# Patient Record
Sex: Female | Born: 1970 | Hispanic: No | Marital: Married | Smoking: Never smoker
Health system: Southern US, Community
[De-identification: ages and names within clinical notes are randomized; demographics above are authoritative.]

## PROBLEM LIST (undated history)

## (undated) DIAGNOSIS — L409 Psoriasis, unspecified: Secondary | ICD-10-CM

## (undated) DIAGNOSIS — N926 Irregular menstruation, unspecified: Secondary | ICD-10-CM

## (undated) DIAGNOSIS — I10 Essential (primary) hypertension: Secondary | ICD-10-CM

## (undated) DIAGNOSIS — G43909 Migraine, unspecified, not intractable, without status migrainosus: Secondary | ICD-10-CM

## (undated) DIAGNOSIS — N760 Acute vaginitis: Secondary | ICD-10-CM

## (undated) DIAGNOSIS — Z22330 Carrier of Group B streptococcus: Secondary | ICD-10-CM

## (undated) DIAGNOSIS — N83201 Unspecified ovarian cyst, right side: Secondary | ICD-10-CM

## (undated) DIAGNOSIS — N979 Female infertility, unspecified: Secondary | ICD-10-CM

## (undated) DIAGNOSIS — N898 Other specified noninflammatory disorders of vagina: Secondary | ICD-10-CM

## (undated) DIAGNOSIS — M1990 Unspecified osteoarthritis, unspecified site: Secondary | ICD-10-CM

## (undated) DIAGNOSIS — Z8742 Personal history of other diseases of the female genital tract: Secondary | ICD-10-CM

## (undated) DIAGNOSIS — Z8619 Personal history of other infectious and parasitic diseases: Secondary | ICD-10-CM

## (undated) DIAGNOSIS — Z8042 Family history of malignant neoplasm of prostate: Secondary | ICD-10-CM

## (undated) DIAGNOSIS — Z658 Other specified problems related to psychosocial circumstances: Secondary | ICD-10-CM

## (undated) DIAGNOSIS — O139 Gestational [pregnancy-induced] hypertension without significant proteinuria, unspecified trimester: Secondary | ICD-10-CM

## (undated) DIAGNOSIS — B9689 Other specified bacterial agents as the cause of diseases classified elsewhere: Secondary | ICD-10-CM

## (undated) HISTORY — DX: Other specified problems related to psychosocial circumstances: Z65.8

## (undated) HISTORY — DX: Irregular menstruation, unspecified: N92.6

## (undated) HISTORY — DX: Other specified noninflammatory disorders of vagina: N89.8

## (undated) HISTORY — DX: Family history of malignant neoplasm of prostate: Z80.42

## (undated) HISTORY — DX: Personal history of other diseases of the female genital tract: Z87.42

## (undated) HISTORY — PX: BREAST ENHANCEMENT SURGERY: SHX7

## (undated) HISTORY — DX: Female infertility, unspecified: N97.9

## (undated) HISTORY — DX: Essential (primary) hypertension: I10

## (undated) HISTORY — DX: Personal history of other infectious and parasitic diseases: Z86.19

## (undated) HISTORY — PX: BREAST SURGERY: SHX581

## (undated) HISTORY — DX: Acute vaginitis: N76.0

## (undated) HISTORY — DX: Other specified bacterial agents as the cause of diseases classified elsewhere: B96.89

## (undated) HISTORY — DX: Carrier of group B Streptococcus: Z22.330

## (undated) HISTORY — DX: Unspecified ovarian cyst, right side: N83.201

## (undated) HISTORY — DX: Gestational (pregnancy-induced) hypertension without significant proteinuria, unspecified trimester: O13.9

---

## 2001-06-28 DIAGNOSIS — N83201 Unspecified ovarian cyst, right side: Secondary | ICD-10-CM

## 2001-06-28 HISTORY — DX: Unspecified ovarian cyst, right side: N83.201

## 2001-11-26 DIAGNOSIS — N926 Irregular menstruation, unspecified: Secondary | ICD-10-CM

## 2001-11-26 HISTORY — DX: Irregular menstruation, unspecified: N92.6

## 2001-12-06 ENCOUNTER — Other Ambulatory Visit: Admission: RE | Admit: 2001-12-06 | Discharge: 2001-12-06 | Payer: Self-pay | Admitting: Obstetrics and Gynecology

## 2002-11-22 ENCOUNTER — Other Ambulatory Visit: Admission: RE | Admit: 2002-11-22 | Discharge: 2002-11-22 | Payer: Self-pay | Admitting: Obstetrics and Gynecology

## 2003-09-07 ENCOUNTER — Inpatient Hospital Stay (HOSPITAL_COMMUNITY): Admission: RE | Admit: 2003-09-07 | Discharge: 2003-09-07 | Payer: Self-pay | Admitting: Obstetrics and Gynecology

## 2003-10-10 ENCOUNTER — Other Ambulatory Visit: Admission: RE | Admit: 2003-10-10 | Discharge: 2003-10-10 | Payer: Self-pay | Admitting: Gynecology

## 2004-05-26 ENCOUNTER — Ambulatory Visit (HOSPITAL_COMMUNITY): Admission: RE | Admit: 2004-05-26 | Discharge: 2004-05-26 | Payer: Self-pay | Admitting: Obstetrics and Gynecology

## 2004-05-29 ENCOUNTER — Ambulatory Visit: Payer: Self-pay | Admitting: Internal Medicine

## 2004-06-25 ENCOUNTER — Ambulatory Visit (HOSPITAL_COMMUNITY): Admission: RE | Admit: 2004-06-25 | Discharge: 2004-06-25 | Payer: Self-pay | Admitting: Obstetrics and Gynecology

## 2004-07-22 ENCOUNTER — Ambulatory Visit (HOSPITAL_COMMUNITY): Admission: RE | Admit: 2004-07-22 | Discharge: 2004-07-22 | Payer: Self-pay | Admitting: Obstetrics and Gynecology

## 2004-07-30 ENCOUNTER — Ambulatory Visit (HOSPITAL_COMMUNITY): Admission: RE | Admit: 2004-07-30 | Discharge: 2004-07-30 | Payer: Self-pay | Admitting: Obstetrics and Gynecology

## 2004-08-19 ENCOUNTER — Ambulatory Visit (HOSPITAL_COMMUNITY): Admission: RE | Admit: 2004-08-19 | Discharge: 2004-08-19 | Payer: Self-pay | Admitting: Obstetrics and Gynecology

## 2004-08-31 ENCOUNTER — Inpatient Hospital Stay (HOSPITAL_COMMUNITY): Admission: AD | Admit: 2004-08-31 | Discharge: 2004-08-31 | Payer: Self-pay | Admitting: Obstetrics and Gynecology

## 2004-09-03 ENCOUNTER — Inpatient Hospital Stay (HOSPITAL_COMMUNITY): Admission: AD | Admit: 2004-09-03 | Discharge: 2004-09-07 | Payer: Self-pay | Admitting: Obstetrics and Gynecology

## 2004-09-15 ENCOUNTER — Inpatient Hospital Stay (HOSPITAL_COMMUNITY): Admission: AD | Admit: 2004-09-15 | Discharge: 2004-09-17 | Payer: Self-pay | Admitting: Obstetrics and Gynecology

## 2004-09-20 ENCOUNTER — Inpatient Hospital Stay (HOSPITAL_COMMUNITY): Admission: AD | Admit: 2004-09-20 | Discharge: 2004-09-20 | Payer: Self-pay | Admitting: Obstetrics and Gynecology

## 2004-10-13 ENCOUNTER — Other Ambulatory Visit: Admission: RE | Admit: 2004-10-13 | Discharge: 2004-10-13 | Payer: Self-pay | Admitting: Obstetrics and Gynecology

## 2005-06-28 DIAGNOSIS — Z8742 Personal history of other diseases of the female genital tract: Secondary | ICD-10-CM

## 2005-06-28 HISTORY — DX: Personal history of other diseases of the female genital tract: Z87.42

## 2005-07-27 ENCOUNTER — Ambulatory Visit: Payer: Self-pay | Admitting: Internal Medicine

## 2005-11-10 ENCOUNTER — Other Ambulatory Visit: Admission: RE | Admit: 2005-11-10 | Discharge: 2005-11-10 | Payer: Self-pay | Admitting: Obstetrics and Gynecology

## 2005-12-28 ENCOUNTER — Ambulatory Visit: Payer: Self-pay | Admitting: Internal Medicine

## 2006-08-01 ENCOUNTER — Inpatient Hospital Stay (HOSPITAL_COMMUNITY): Admission: AD | Admit: 2006-08-01 | Discharge: 2006-08-01 | Payer: Self-pay | Admitting: Obstetrics and Gynecology

## 2006-08-04 IMAGING — US US OB DETAIL+14 WK
1 series · 13 of 28 positions shown · non-contrast
Comparison: none

CLINICAL DATA: 26 week 3 day gestational age by LMP.  Chronic hypertension and previous miscarriages.

[Series 1: us ob detail+14 wk · 0.35mm/px · 13 of 130 slices shown]
[im 5/130]
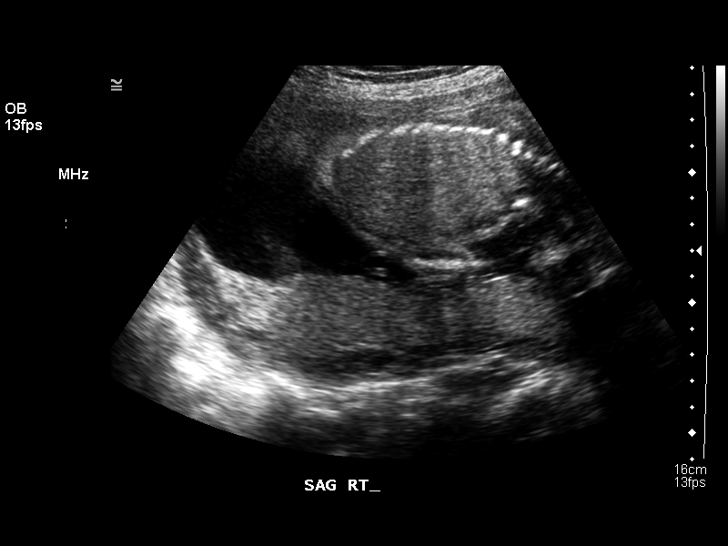
[im 15/130]
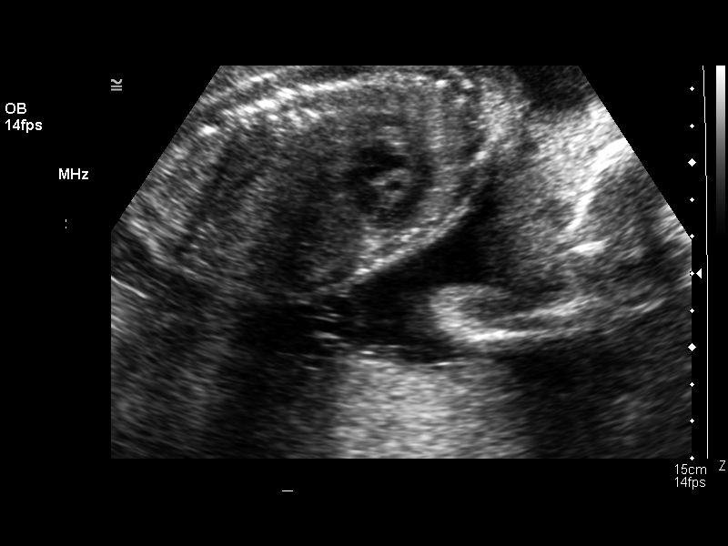
[im 24/130]
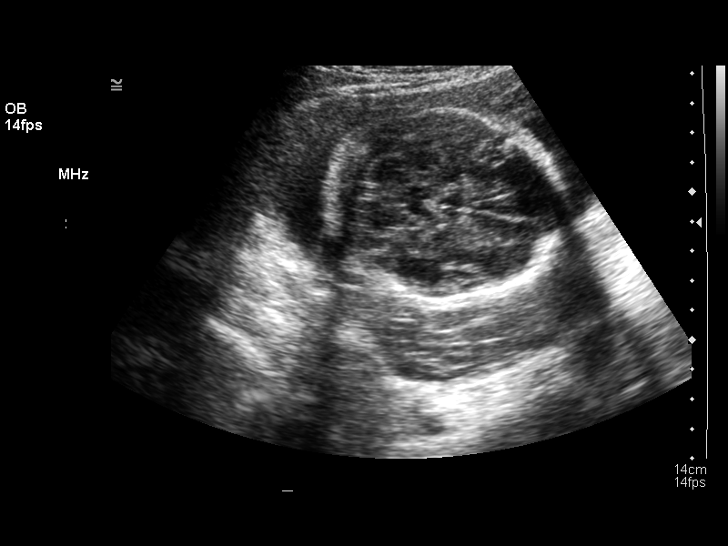
[im 34/130]
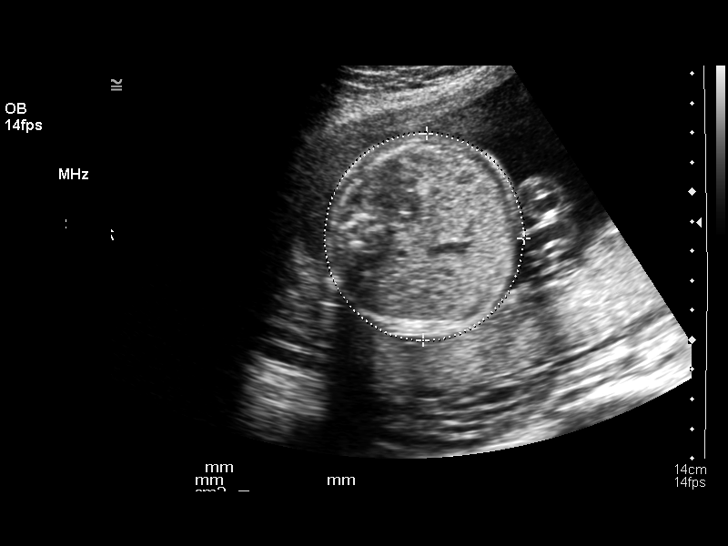
[im 44/130]
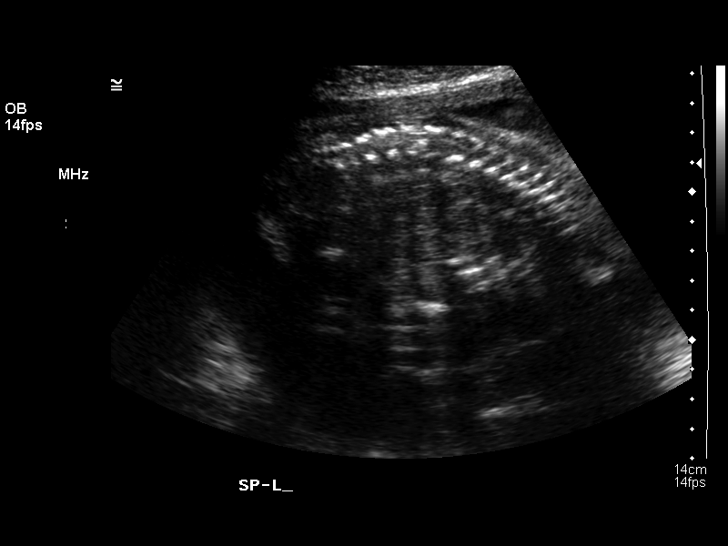
[im 53/130]
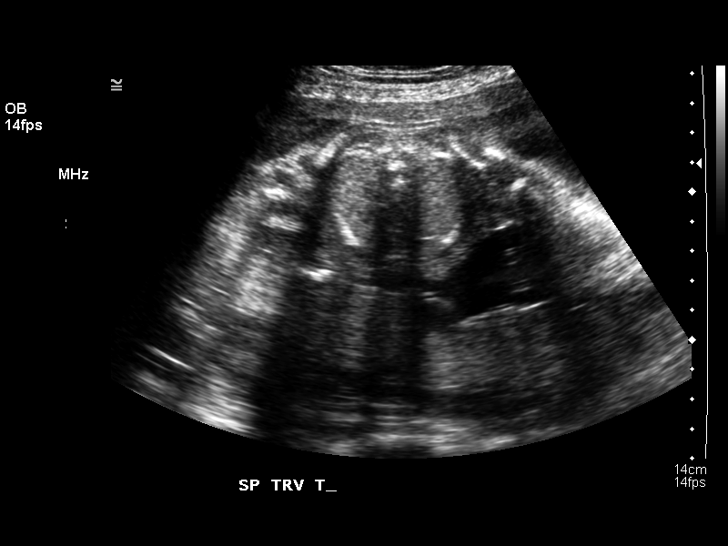
[im 67/130]
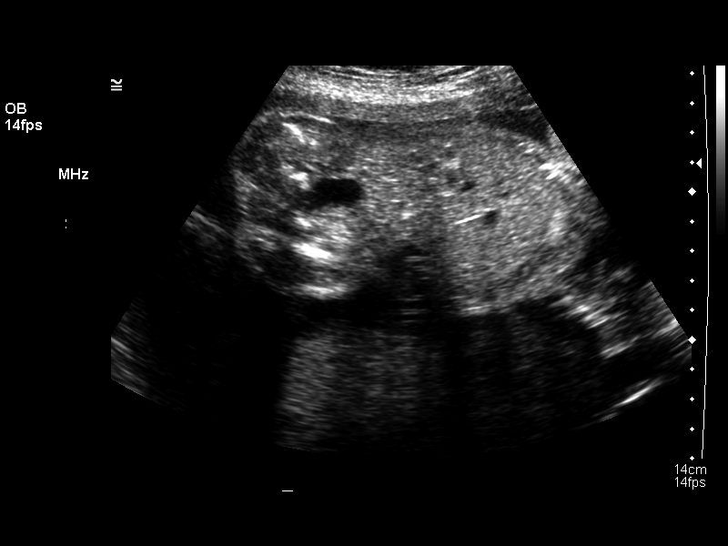
[im 77/130]
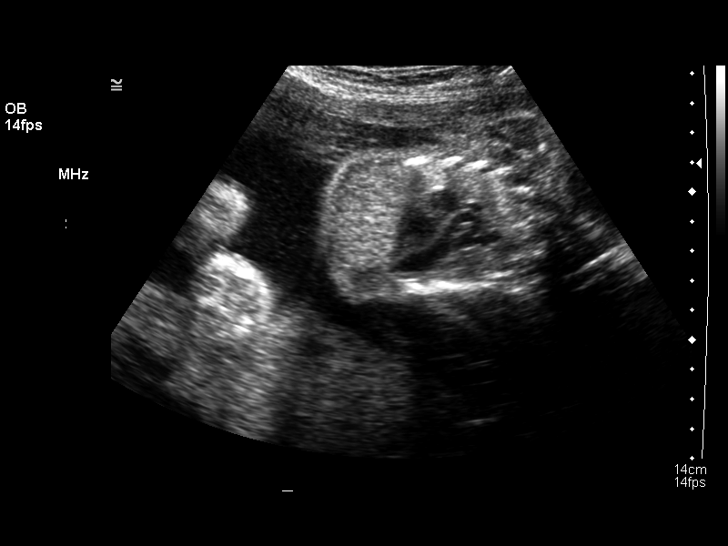
[im 87/130]
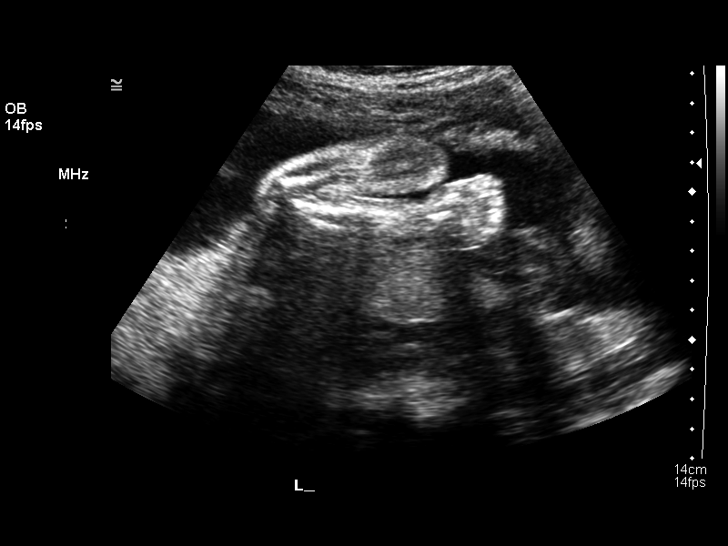
[im 96/130]
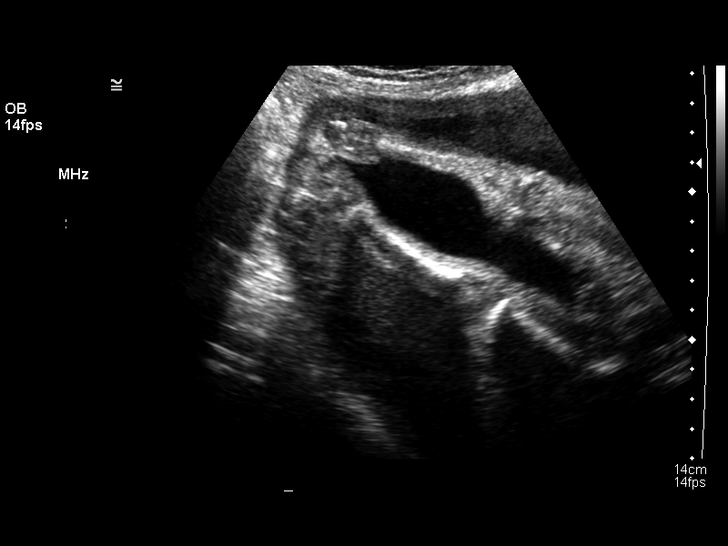
[im 106/130]
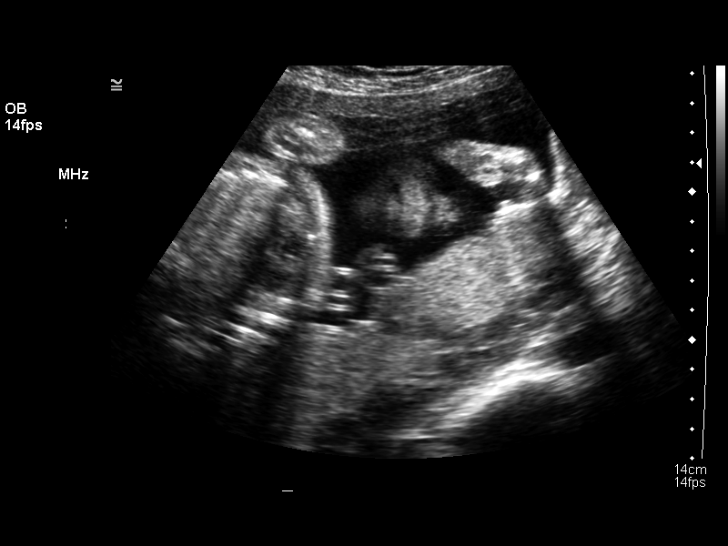
[im 115/130]
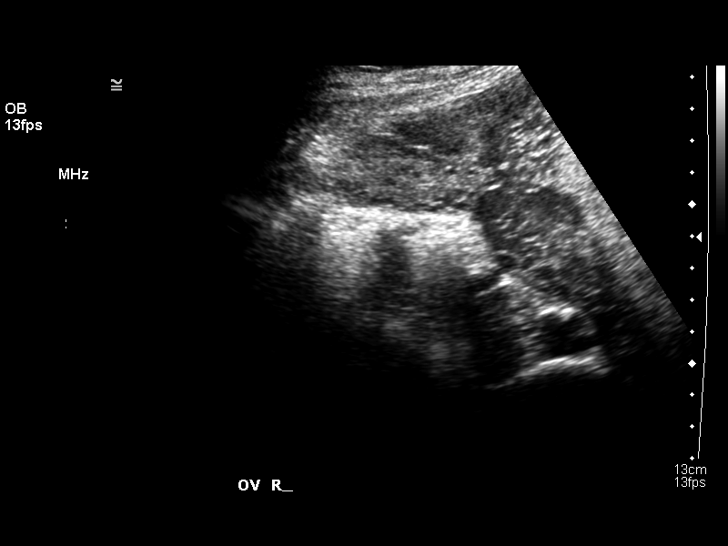
[im 125/130]
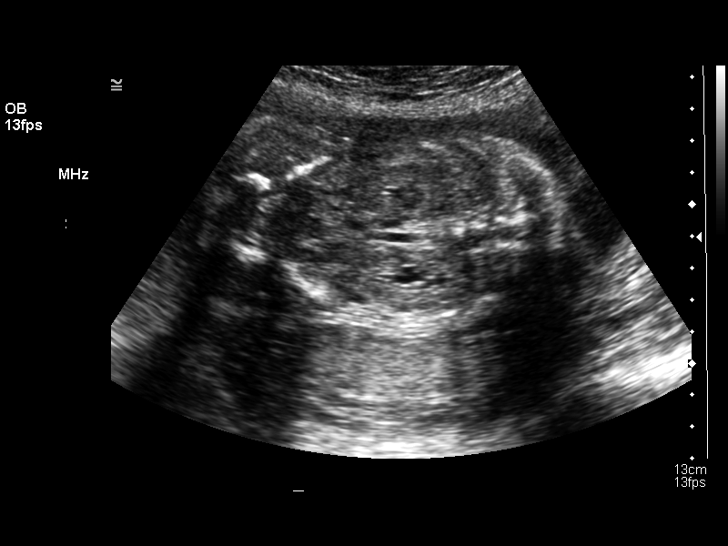

[13 of 28 positions shown; findings below may reference images not displayed]

DETAILED OBSTETRICAL ULTRASOUND:

 Number of Fetuses:  1
 Heart Rate:  153
 Movement:  Yes
 Breathing:  No
 Presentation:  Cephalic
 Placental Location:  Posterior
 Grade:  I
 Previa: No
 Amniotic Fluid (Subjective):  Normal
 Amniotic Fluid (Objective):  5.6 cm Vertical pocket 

 FETAL BIOMETRY
 BPD:  6.5 cm   26 w 3 d
 HC:  24.2 cm   26 w 2 d
 AC:  21.5 cm   26 w 1 d
 FL:  4.8 cm   26 w 3 d
 HL:  4.4 cm   26 w 2 d

 MEAN GA:  26 w 2 d

 FETAL ANATOMY
 Lateral Ventricles:    Visualized 
 Thalami/CSP:  Visualized 
 Posterior Fossa:  Visualized 
 Nuchal Region:  N/A
 Spine:  Visualized   
 4 Chamber Heart on Left:  Visualized 
 Stomach on Left:  Visualized 
 3 Vessel Cord:  Visualized 
 Cord Insertion Site:  Visualized 
 Kidneys:  Visualized 
 Bladder:  Visualized 
 Extremities:  Visualized 

 ADDITIONAL ANATOMY VISUALIZED:  LVOT, RVOT, upper lip, orbits, profile, diaphragm, heel,  5th digit, ductal arch, aortic arch.
 Comment:  A nasal bone is seen.  No sonographic markers for aneuploidy are identified.

 MATERNAL UTERINE AND ADNEXAL FINDINGS
 Cervix:  3.7 cm Transabdominally.
 The right ovary is visualized and is normal in appearance.  The left ovary is not visualized, but no adnexal masses are identified.
IMPRESSION: Single living intrauterine fetus with mean gestational age of 26 weeks 2 days and sonographic EDC of 08/30/04.  This correlates closely with LMP dating. 
 No evidence of fetal anatomic abnormality.  
 Normal amniotic fluid volume.  

 </u12:p>

## 2006-08-15 ENCOUNTER — Inpatient Hospital Stay (HOSPITAL_COMMUNITY): Admission: AD | Admit: 2006-08-15 | Discharge: 2006-08-15 | Payer: Self-pay | Admitting: Obstetrics and Gynecology

## 2006-09-01 ENCOUNTER — Inpatient Hospital Stay (HOSPITAL_COMMUNITY): Admission: AD | Admit: 2006-09-01 | Discharge: 2006-09-04 | Payer: Self-pay | Admitting: Obstetrics and Gynecology

## 2006-09-03 IMAGING — US US OB FOLLOW-UP
1 series · 13 of 28 positions shown · non-contrast
Comparison: none

CLINICAL DATA: Chronic hypertension.  Assess growth.

[Series 1: us ob follow-up · 0.33mm/px · 13 of 54 slices shown]
[im 2/54]
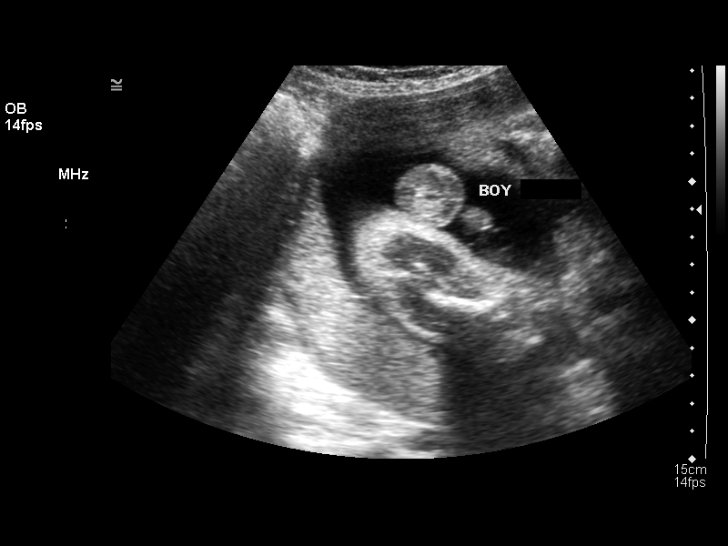
[im 6/54]
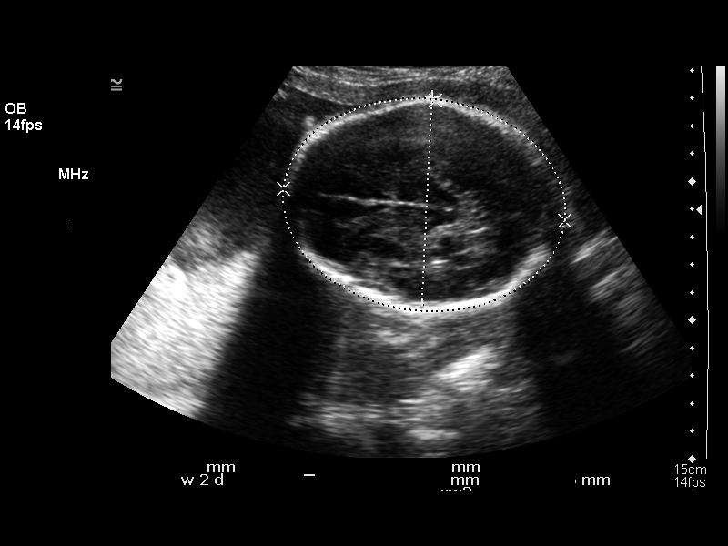
[im 10/54]
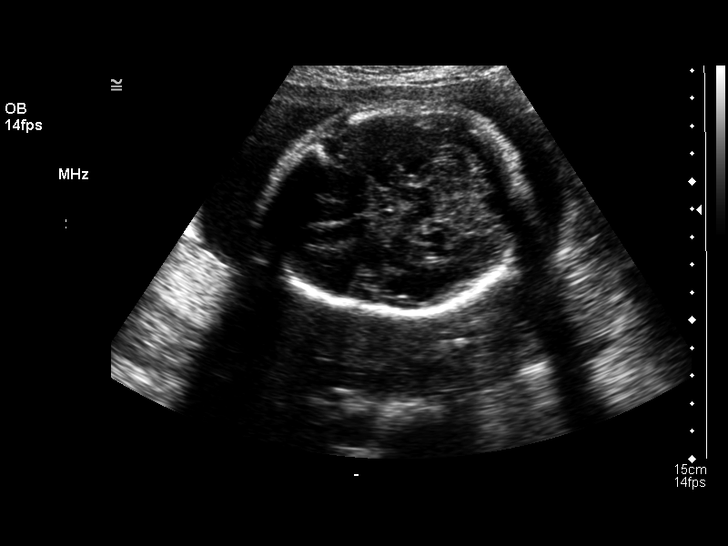
[im 14/54]
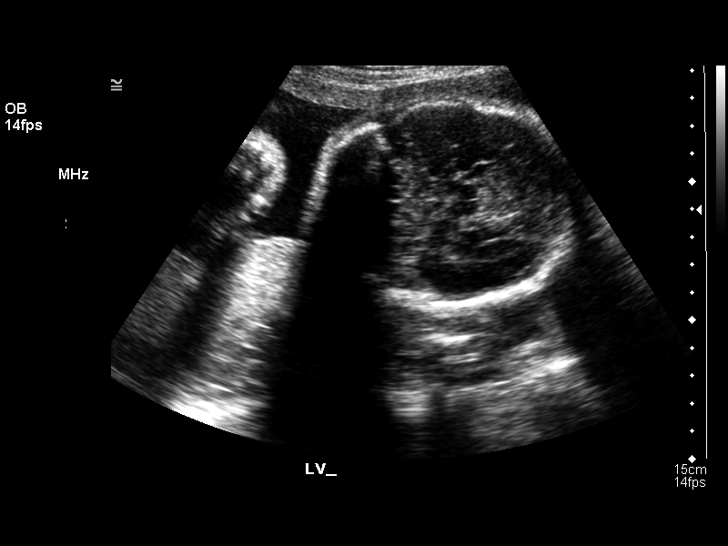
[im 18/54]
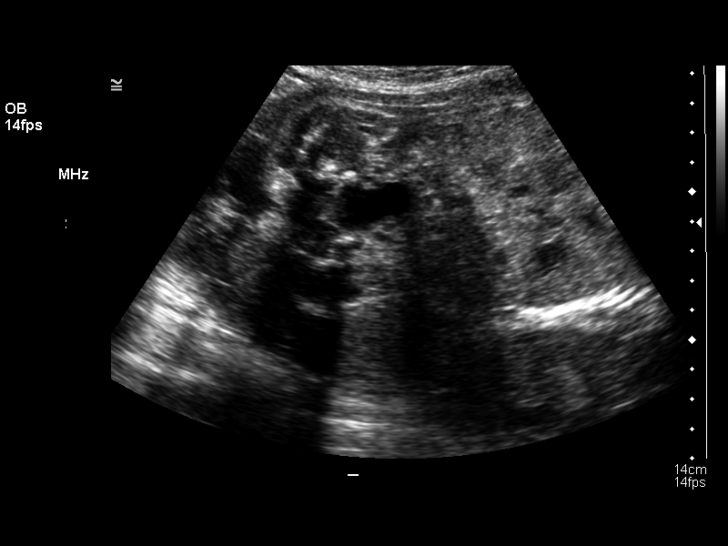
[im 22/54]
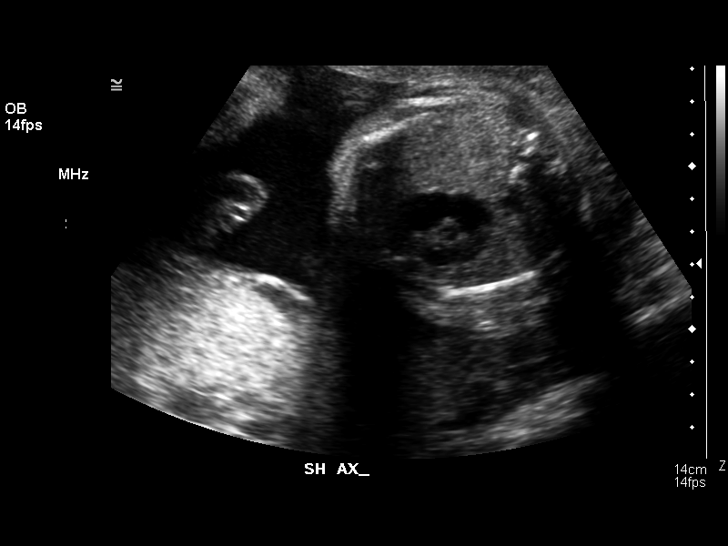
[im 28/54]
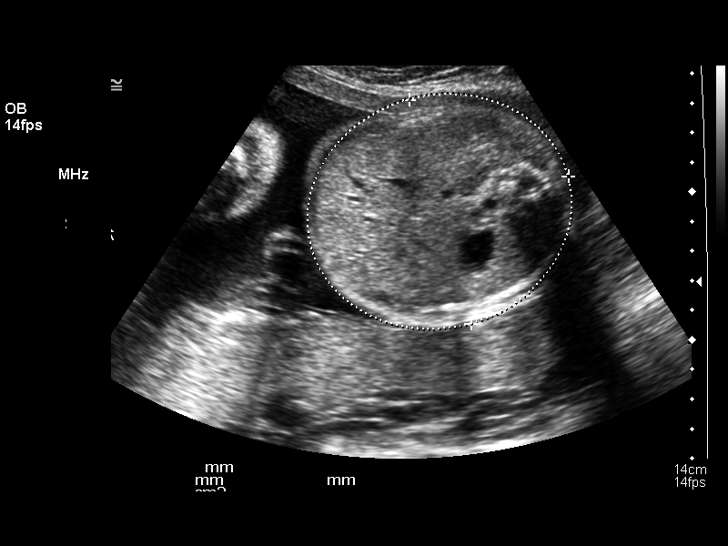
[im 32/54]
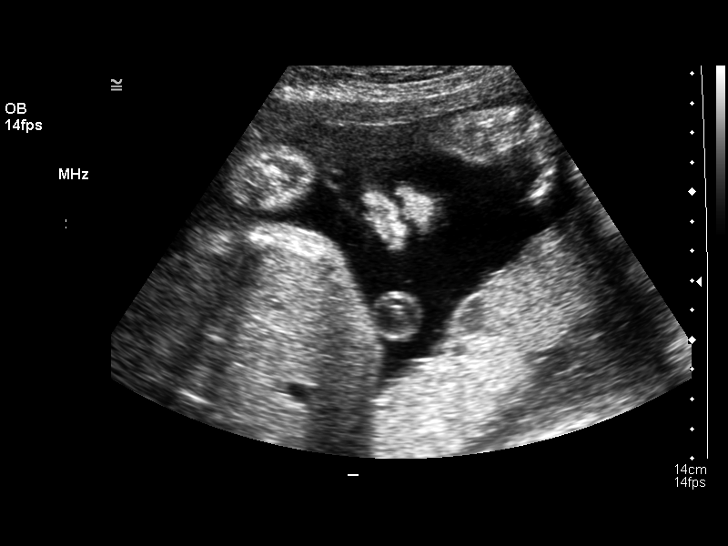
[im 36/54]
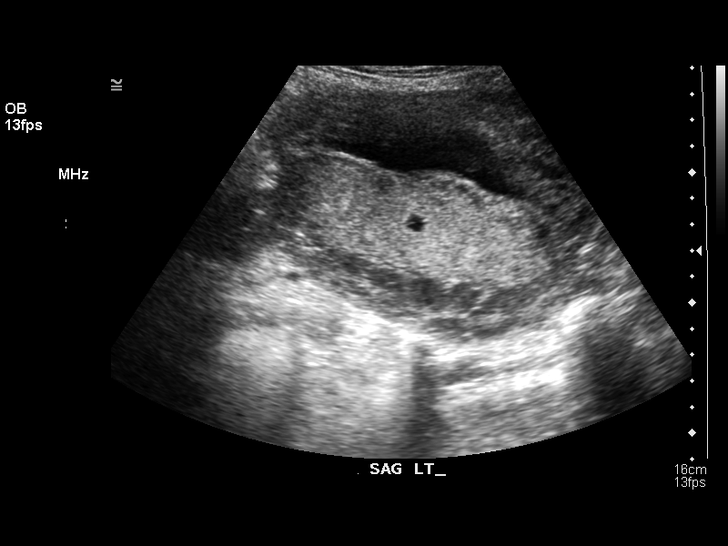
[im 40/54]
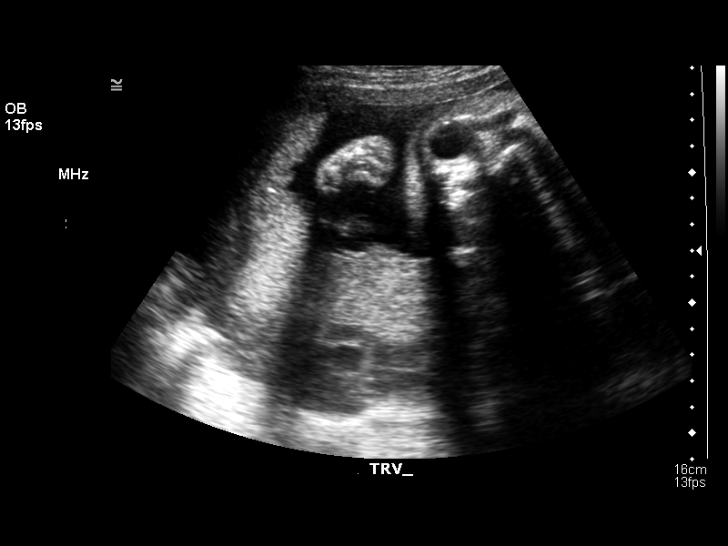
[im 44/54]
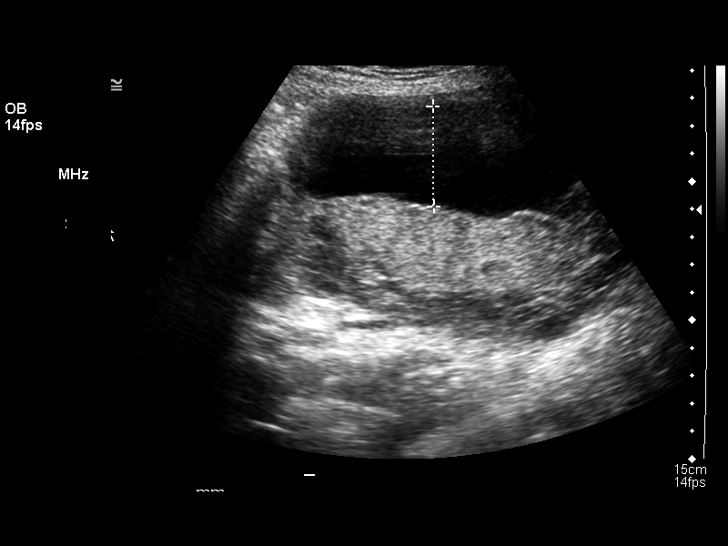
[im 48/54]
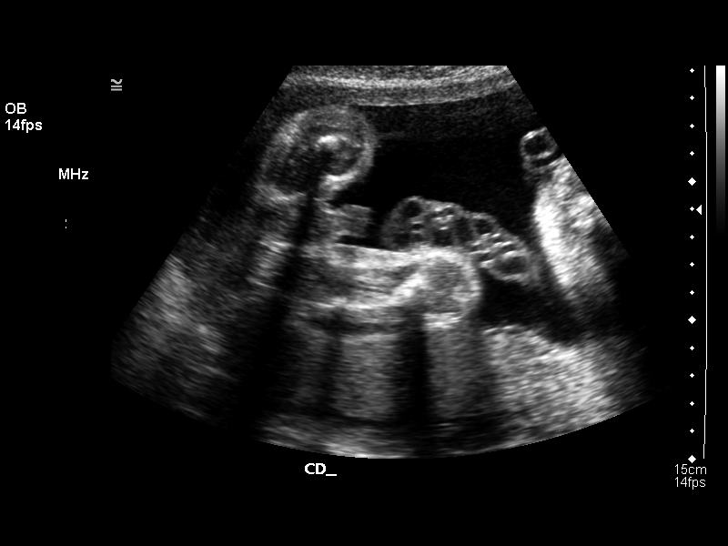
[im 52/54]
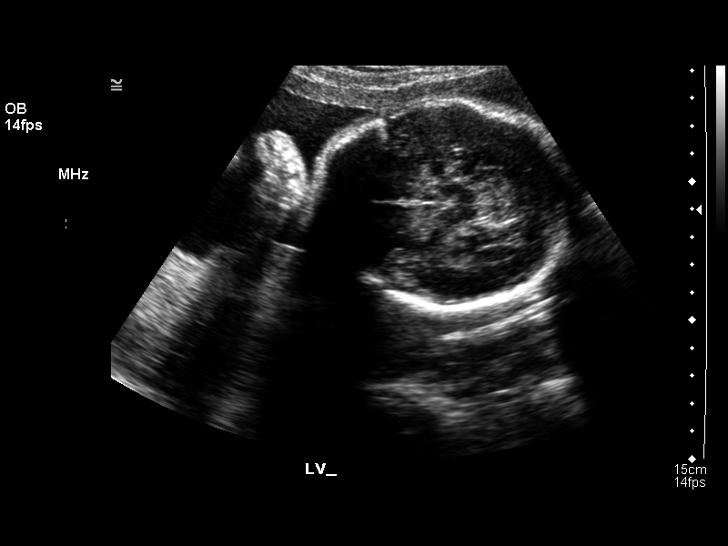

[13 of 28 positions shown; findings below may reference images not displayed]

OBSTETRICAL ULTRASOUND RE-EVALUATION:
Number of Fetuses:  1
Heart Rate:  144
Movement:  Yes
Breathing:  Yes
Presentation:  Cephalic
Placental Location:  Posterior
Grade:  I
Previa:  No
Amniotic Fluid (subjective):  Normal
Amniotic Fluid (objective):  16.0 cm AFI (5th -95th%ile = 8.8 ? 23.8 cm for 31 wks)

FETAL BIOMETRY
BPD:  7.6 cm   30 w 4 d
HC:  28.0 cm   30 w 5 d
AC:  26.5 cm  30 w 4 d
FL:  5.9 cm   30 w 6 d

Mean GA:  30 w 5 d
Assigned GA:  30 w 5 d

EFW:  4544 g (H) 50th ? 75th%ile (9509 ? 1617 g) For 31 wks

FETAL ANATOMY
Lateral Ventricles:  Visualized 
Thalami/CSP:  Visualized 
Posterior Fossa:  Visualized 
Nuchal Region:  Previously seen 
Spine:  Previously seen 
4 Chamber Heart on Left:  Visualized 
Stomach on Left:  Visualized 
3 Vessel Cord:  Visualized 
Cord Insertion Site:  Previously seen 
Kidneys:  Visualized 
Bladder:  Previously seen 
Extremities:  Previously seen 

ADDITIONAL ANATOMY VISUALIZED:  LVOT, upper lip, orbits, diaphragm and male genitalia.  

MATERNAL UTERINE AND ADNEXAL FINDINGS
Cervix:  4.4 cm Transabdominally
IMPRESSION: 1.  Single intrauterine pregnancy demonstrating an estimated gestational age by ultrasound 30 weeks and 5 days.  Correlation with expected estimated gestational age by LMP of 30 weeks and 5 days suggests appropriate growth.  Currently the estimated fetal weight is just below the 75th percentile for a 31 week gestation.  On the prior exam the estimated fetal weight was just above the 75th percentile for a 26 week gestation and appropriate interval growth is felt to occurred.
2.  Subjectively and quantitatively normal amniotic fluid volume and normal cervical length.
3.  No late developing fetal anatomic abnormalities are identified associated with the lateral ventricles, four chamber heart, stomach, kidneys, or bladder.

## 2007-04-29 DIAGNOSIS — N898 Other specified noninflammatory disorders of vagina: Secondary | ICD-10-CM

## 2007-04-29 HISTORY — DX: Other specified noninflammatory disorders of vagina: N89.8

## 2009-10-26 DIAGNOSIS — Z658 Other specified problems related to psychosocial circumstances: Secondary | ICD-10-CM

## 2009-10-26 HISTORY — DX: Other specified problems related to psychosocial circumstances: Z65.8

## 2010-06-28 DIAGNOSIS — B9689 Other specified bacterial agents as the cause of diseases classified elsewhere: Secondary | ICD-10-CM

## 2010-06-28 DIAGNOSIS — N760 Acute vaginitis: Secondary | ICD-10-CM

## 2010-06-28 HISTORY — DX: Other specified bacterial agents as the cause of diseases classified elsewhere: B96.89

## 2010-06-28 HISTORY — DX: Other specified bacterial agents as the cause of diseases classified elsewhere: N76.0

## 2010-07-19 ENCOUNTER — Encounter: Payer: Self-pay | Admitting: Obstetrics and Gynecology

## 2011-03-08 ENCOUNTER — Other Ambulatory Visit: Payer: Self-pay | Admitting: Obstetrics and Gynecology

## 2011-03-08 DIAGNOSIS — N6325 Unspecified lump in the left breast, overlapping quadrants: Secondary | ICD-10-CM

## 2011-03-12 ENCOUNTER — Other Ambulatory Visit: Payer: Self-pay

## 2011-03-18 ENCOUNTER — Other Ambulatory Visit: Payer: Self-pay

## 2011-06-29 HISTORY — PX: BREAST ENHANCEMENT SURGERY: SHX7

## 2011-11-24 ENCOUNTER — Encounter: Payer: Self-pay | Admitting: Obstetrics and Gynecology

## 2011-11-25 ENCOUNTER — Ambulatory Visit (INDEPENDENT_AMBULATORY_CARE_PROVIDER_SITE_OTHER): Payer: 59 | Admitting: Obstetrics and Gynecology

## 2011-11-25 ENCOUNTER — Encounter: Payer: Self-pay | Admitting: Obstetrics and Gynecology

## 2011-11-25 VITALS — BP 120/70 | Resp 16 | Ht 64.5 in | Wt 130.0 lb

## 2011-11-25 DIAGNOSIS — Z30432 Encounter for removal of intrauterine contraceptive device: Secondary | ICD-10-CM

## 2011-11-25 DIAGNOSIS — Z309 Encounter for contraceptive management, unspecified: Secondary | ICD-10-CM

## 2011-11-25 MED ORDER — NORETHIN-ETH ESTRAD-FE BIPHAS 1 MG-10 MCG / 10 MCG PO TABS: 1.0000 | ORAL_TABLET | Freq: Every day | ORAL | Status: DC

## 2011-11-25 NOTE — Progress Notes (Signed)
41 YO presents for Mirena IUD  removal due to persistent bacterial vaginosis and the fact that her husband is planning a vasectomy. Discussed and reviewed contraceptive options until vasectomy process is completed to include: barrier methods, hormonal and spermicides.  After considering effectiveness, ease of use and ease of discontinuing, the   patient wants to take oral contraceptives.  O: Pelvic: EGBUS-wnl; vagina-normal rugae; cervix-no lesions, IUD removed with ring forceps without difficulty; uterus-normal size; adnexae-no tenderness/masses  A:  IUD Remova      Need for Contraception      H/O Persistent Bacterial Vaginosis  P: Lo Loestrin Fe  # 3 (samples) 1 po qd starting today      Patient given oral contraceptive instruction sheet       Patient to monitor Bp on BCPs       Reviewed risks of VTE events and need to use      a back up method until pills have been taken x 1 month   Robin Johannsen, PA-C

## 2011-11-26 ENCOUNTER — Encounter: Payer: 59 | Admitting: Obstetrics and Gynecology

## 2011-11-26 ENCOUNTER — Other Ambulatory Visit: Payer: Self-pay | Admitting: Plastic Surgery

## 2012-02-17 ENCOUNTER — Telehealth: Payer: Self-pay | Admitting: Obstetrics and Gynecology

## 2012-02-17 NOTE — Telephone Encounter (Signed)
PT CALLING TO GET SAMPLES OF LOLOESTRIN FE. LEFT SAMPLES AT FRONT DESK FOR PT PICK UP

## 2013-01-01 ENCOUNTER — Other Ambulatory Visit: Payer: Self-pay | Admitting: Obstetrics and Gynecology

## 2013-01-01 DIAGNOSIS — Z1231 Encounter for screening mammogram for malignant neoplasm of breast: Secondary | ICD-10-CM

## 2013-01-22 ENCOUNTER — Ambulatory Visit
Admission: RE | Admit: 2013-01-22 | Discharge: 2013-01-22 | Disposition: A | Discharge status: Home / Self Care | Payer: BC Managed Care – PPO | Source: Ambulatory Visit | Attending: Obstetrics and Gynecology | Admitting: Obstetrics and Gynecology

## 2013-01-22 ENCOUNTER — Other Ambulatory Visit: Payer: Self-pay | Admitting: Obstetrics and Gynecology

## 2013-01-22 DIAGNOSIS — Z1231 Encounter for screening mammogram for malignant neoplasm of breast: Secondary | ICD-10-CM

## 2014-03-05 ENCOUNTER — Other Ambulatory Visit: Payer: Self-pay

## 2014-03-05 DIAGNOSIS — Z1231 Encounter for screening mammogram for malignant neoplasm of breast: Secondary | ICD-10-CM

## 2014-04-03 ENCOUNTER — Ambulatory Visit
Admission: RE | Admit: 2014-04-03 | Discharge: 2014-04-03 | Disposition: A | Discharge status: Home / Self Care | Payer: 59 | Source: Ambulatory Visit

## 2014-04-03 DIAGNOSIS — Z1231 Encounter for screening mammogram for malignant neoplasm of breast: Secondary | ICD-10-CM

## 2014-04-29 ENCOUNTER — Encounter: Payer: Self-pay | Admitting: Obstetrics and Gynecology

## 2017-06-27 ENCOUNTER — Other Ambulatory Visit: Payer: Self-pay | Admitting: Obstetrics and Gynecology

## 2017-06-27 ENCOUNTER — Other Ambulatory Visit: Payer: Self-pay | Admitting: Internal Medicine

## 2017-06-27 DIAGNOSIS — N6320 Unspecified lump in the left breast, unspecified quadrant: Secondary | ICD-10-CM

## 2017-07-01 ENCOUNTER — Other Ambulatory Visit: Payer: Self-pay

## 2019-06-29 DIAGNOSIS — C801 Malignant (primary) neoplasm, unspecified: Secondary | ICD-10-CM

## 2019-06-29 HISTORY — DX: Malignant (primary) neoplasm, unspecified: C80.1

## 2019-07-25 ENCOUNTER — Other Ambulatory Visit: Payer: Self-pay | Admitting: Obstetrics and Gynecology

## 2019-07-25 DIAGNOSIS — N63 Unspecified lump in unspecified breast: Secondary | ICD-10-CM

## 2019-07-26 ENCOUNTER — Ambulatory Visit
Admission: RE | Admit: 2019-07-26 | Discharge: 2019-07-26 | Disposition: A | Discharge status: Home / Self Care | Payer: BC Managed Care – PPO | Source: Ambulatory Visit | Attending: Obstetrics and Gynecology | Admitting: Obstetrics and Gynecology

## 2019-07-26 ENCOUNTER — Other Ambulatory Visit: Payer: Self-pay

## 2019-07-26 DIAGNOSIS — N63 Unspecified lump in unspecified breast: Secondary | ICD-10-CM

## 2019-07-27 ENCOUNTER — Encounter: Payer: Self-pay | Admitting: *Deleted

## 2019-07-27 ENCOUNTER — Telehealth: Payer: Self-pay | Admitting: Hematology and Oncology

## 2019-07-27 NOTE — Telephone Encounter (Signed)
Spoke with patient to confirm morning BC appointment for 2/3, packet emailed to patient

## 2019-07-30 ENCOUNTER — Other Ambulatory Visit: Payer: Self-pay | Admitting: Obstetrics and Gynecology

## 2019-07-30 ENCOUNTER — Other Ambulatory Visit: Payer: Self-pay | Admitting: *Deleted

## 2019-07-30 DIAGNOSIS — R928 Other abnormal and inconclusive findings on diagnostic imaging of breast: Secondary | ICD-10-CM

## 2019-07-30 DIAGNOSIS — C50412 Malignant neoplasm of upper-outer quadrant of left female breast: Secondary | ICD-10-CM

## 2019-07-30 DIAGNOSIS — Z170 Estrogen receptor positive status [ER+]: Secondary | ICD-10-CM | POA: Insufficient documentation

## 2019-07-30 DIAGNOSIS — N950 Postmenopausal bleeding: Secondary | ICD-10-CM

## 2019-07-31 NOTE — Progress Notes (Addendum)
[facility]  [phone] [phone]     ID: Robin Castillo DOB: [birth year]  MR#: [id number]  KVQ#:[id number]  Patient Care Team: [name], MD as PCP - General (Internal Medicine) Rockwell Germany, RN as Oncology Nurse Navigator Mauro Kaufmann, RN as Oncology Nurse Navigator Kyung Rudd, MD as Consulting Physician (Radiation Oncology) [name], MD as Consulting Physician (General Surgery) Verne Cove, Virgie Dad, MD as Consulting Physician (Oncology) Earnstine Regal, PA-C as Consulting Physician (Obstetrics and Gynecology) Belva Crome, MD as Consulting Physician (Cardiology) Chauncey Cruel, MD OTHER MD:  CHIEF COMPLAINT: Invasive ductal breast cancer, estrogen receptor positive  CURRENT TREATMENT: Awaiting definitive surgery   HISTORY OF CURRENT ILLNESS: Robin Castillo presented with a palpable left breast lump. She underwent bilateral diagnostic mammography with tomography and left breast ultrasonography at [facility] on 07/25/2019 showing: breast density category B; mass with associated clustered calcifications (overall dimension up to 1.2 cm on mammogram and 0.9 cm on ultrasound) within upper-outer left breast at 1 o'clock; left axillary lymph node appears within normal limits.  Accordingly on 07/26/2019 she proceeded to biopsy of the left breast area in question. The pathology from this procedure ([id number]) showed: invasive ductal carcinoma, grade 2. Prognostic indicators significant for: estrogen receptor, 70% positive with moderate staining intensity and progesterone receptor, 95% positive with strong staining intensity. Proliferation marker Ki67 at 2%. HER2 negative by immunohistochemistry (1+).  The patient's subsequent history is as detailed below.   INTERVAL HISTORY: Robin Castillo was evaluated in the multidisciplinary breast cancer clinic on 08/01/2019 accompanied by her sister-in-law Robin Castillo. Her case was also presented  at the multidisciplinary breast cancer conference on the same day. At that time a preliminary plan was proposed: Breast conserving surgery with sentinel lymph node sampling, Oncotype, adjuvant radiation, antiestrogens   REVIEW OF SYSTEMS: Robin Castillo reports weight change and loss of sleep associated with learning of her diagnosis. She also reports psoriasis, specifically noting petechiae to her toes bilaterally, and arthritis in her hands. The patient denies unusual headaches, visual changes, nausea, vomiting, stiff neck, dizziness, or gait imbalance. There has been no cough, phlegm production, or pleurisy, no chest pain or pressure, and no change in bowel or bladder habits. The patient denies fever, rash, bleeding, unexplained fatigue or unexplained weight loss. A detailed review of systems was otherwise entirely negative.   PAST MEDICAL HISTORY: Past Medical History:  Diagnosis Date  . BV (bacterial vaginosis) 2012  . Cyst of ovary, right 2003  . GBS carrier   . H/O rubella   . H/O varicella   . History of PCOS 06/2005  . Hypertension   . Infertility, female   . Irregular menses 11/2001  . Leukorrhea 04/2007   phsiological  . Pregnancy induced hypertension   . Psychosocial stressors 10/2009    PAST SURGICAL HISTORY: Past Surgical History:  Procedure Laterality Date  . BREAST ENHANCEMENT SURGERY    . CESAREAN SECTION      FAMILY HISTORY: Family History  Problem Relation Age of Onset  . Heart disease Mother   . Hypertension Mother   . Heart disease Father   . Depression Father   . Alcohol abuse Father   . Prostate cancer Father    Patient's father is 29 years old and patient's mother 38 years old. (as of 07/2019) The patient denies a family hx of breast or ovarian cancer. She does report her father was recently diagnosed with prostate cancer. She has 1 sister.   GYNECOLOGIC HISTORY:  No LMP recorded. (Menstrual status: IUD). Menarche: 49 years old Age at first live birth: 49  years old Robin Castillo P 2 LMP 07/28/2019, periods are irregular Contraceptive: yes, used for more than 20 years HRT n/a  Hysterectomy? no BSO? 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 Husband Robin Castillo is a self-employed Forensic psychologist. She lives at home with her husband and two sons:Robin Castillo is 60 and son Robin Castillo is 45. She attends a local [employer]  ADVANCED DIRECTIVES: In the absence of any documentation to the contrary, the patient's spouse is their HCPOA.    HEALTH MAINTENANCE: Social History   Tobacco Use  . Smoking status: Never Smoker  . Smokeless tobacco: Never Used  Substance Use Topics  . Alcohol use: Yes    Comment: Glass of champagne once a month  . Drug use: No     Colonoscopy: n/a (age)  PAP: 06/2018  Bone density: n/a (age)   No Known Allergies  Current Outpatient Medications  Medication Sig Dispense Refill  . levonorgestrel (MIRENA) 20 MCG/24HR IUD 1 each by Intrauterine route once.    Marland Kitchen lisinopril-hydrochlorothiazide (PRINZIDE,ZESTORETIC) 10-12.5 MG per tablet Take 1 tablet by mouth daily.    . Norethindrone-Ethinyl Estradiol-Fe Biphas (LO LOESTRIN FE) 1 MG-10 MCG / 10 MCG tablet Take 1 tablet by mouth daily. 3 Package 0  . tinidazole (TINDAMAX) 500 MG tablet Take by mouth daily with breakfast.     No current facility-administered medications for this visit.    OBJECTIVE: Young Native American woman who appears stated age  59:   08/01/19 1314  BP: (!) 122/94  Pulse: 83  Resp: 18  Temp: 98 F (36.7 C)  SpO2: 100%     Body mass index is 25.1 kg/m.   Wt Readings from Last 3 Encounters:  08/01/19 146 lb 3.2 oz (66.3 kg)  09/12/16 148 lb (67.1 kg)  11/25/11 130 lb (59 kg)      ECOG FS:1 - Symptomatic but completely ambulatory  Ocular: Sclerae unicteric, pupils round and equal Ear-nose-throat: Wearing a mask Lymphatic: No cervical or supraclavicular adenopathy Lungs no rales or rhonchi Heart regular  rate and rhythm Abd soft, nontender, positive bowel sounds MSK no focal spinal tenderness, no joint edema Neuro: non-focal, well-oriented, appropriate affect Breasts: The right breast is unremarkable.  The left breast is status post recent biopsy.  There is a minimal ecchymosis.  There is no skin or nipple change of concern.  Both axillae are benign.   LAB RESULTS:  CMP     Component Value Date/Time   NA 141 08/01/2019 1234   K 3.1 (L) 08/01/2019 1234   CL 102 08/01/2019 1234   CO2 29 08/01/2019 1234   GLUCOSE 88 08/01/2019 1234   BUN 7 08/01/2019 1234   CREATININE 0.77 08/01/2019 1234   CALCIUM 9.0 08/01/2019 1234   PROT 7.1 08/01/2019 1234   ALBUMIN 4.1 08/01/2019 1234   AST 15 08/01/2019 1234   ALT 13 08/01/2019 1234   ALKPHOS 48 08/01/2019 1234   BILITOT 0.3 08/01/2019 1234   GFRNONAA >60 08/01/2019 1234   GFRAA >60 08/01/2019 1234    No results found for: TOTALPROTELP, ALBUMINELP, A1GS, A2GS, BETS, BETA2SER, GAMS, MSPIKE, SPEI  Lab Results  Component Value Date   WBC 3.4 (L) 08/01/2019   NEUTROABS 1.9 08/01/2019   HGB 11.2 (L) 08/01/2019   HCT 34.9 (L) 08/01/2019   MCV 86.0 08/01/2019   PLT 313 08/01/2019  No results found for: LABCA2  No components found for: FXTKWI097  No results for input(s): INR in the last 168 hours.  No results found for: LABCA2  No results found for: DZH299  No results found for: MEQ683  No results found for: MHD622  No results found for: CA2729  No components found for: HGQUANT  No results found for: CEA1 / No results found for: CEA1   No results found for: AFPTUMOR  No results found for: CHROMOGRNA  No results found for: KPAFRELGTCHN, LAMBDASER, KAPLAMBRATIO (kappa/lambda light chains)  No results found for: HGBA, HGBA2QUANT, HGBFQUANT, HGBSQUAN (Hemoglobinopathy evaluation)   No results found for: LDH  No results found for: IRON, TIBC, IRONPCTSAT (Iron and TIBC)  No results found for:  FERRITIN  Urinalysis No results found for: COLORURINE, APPEARANCEUR, LABSPEC, PHURINE, GLUCOSEU, HGBUR, BILIRUBINUR, KETONESUR, PROTEINUR, UROBILINOGEN, NITRITE, LEUKOCYTESUR   STUDIES: MM CLIP PLACEMENT LEFT  Result Date: 07/26/2019 CLINICAL DATA:  Worsened guided core needle biopsy was performed of a palpable mass containing internal heterogeneous coarse calcifications in the 1 o'clock position of the left breast. EXAM: DIAGNOSTIC LEFT MAMMOGRAM POST ULTRASOUND BIOPSY COMPARISON:  Previous exam(s). FINDINGS: Mammographic images were obtained following ultrasound guided biopsy of the left breast. The biopsy marking clip is in expected position at the site of biopsy. IMPRESSION: Appropriate positioning of the ribbon shaped biopsy marking clip at the site of biopsy in the upper outer quadrant. Final Assessment: Post Procedure Mammograms for Marker Placement Electronically Signed   By: Curlene Dolphin M.D.   On: 07/26/2019 16:07   [place] LT BREAST BX W LOC DEV 1ST LESION IMG BX SPEC US GUIDE  Addendum Date: 07/30/2019   ADDENDUM REPORT: 07/27/2019 12:57 ADDENDUM: Pathology revealed GRADE II INVASIVE DUCTAL CARCINOMA of the LEFT breast, 1 o'clock, 3 to 4 cm from nipple. This was found to be concordant by Robin. Curlene Dolphin. Pathology results were discussed with the patient by telephone. The patient reported doing well after the biopsy with tenderness at the site. Post biopsy instructions and care were reviewed and questions were answered. The patient was encouraged to call [facility] for any additional concerns. The patient was referred to [facility] on August 01, 2019. Pathology results reported by Stacie Acres RN on 07/27/2019. Electronically Signed   By: Curlene Dolphin M.D.   On: 07/27/2019 12:57   Result Date: 07/30/2019 CLINICAL DATA:  Ultrasound-guided core needle biopsy was performed of a palpable  hypoechoic mass with internal calcification in the 1 o'clock position of the left breast. EXAM: ULTRASOUND GUIDED LEFT BREAST CORE NEEDLE BIOPSY COMPARISON:  Previous exam(s). FINDINGS: I met with the patient and we discussed the procedure of ultrasound-guided biopsy, including benefits and alternatives. We discussed the high likelihood of a successful procedure. We discussed the risks of the procedure, including infection, bleeding, tissue injury, clip migration, and inadequate sampling. Informed written consent was given. The usual time-out protocol was performed immediately prior to the procedure. Lesion quadrant: Upper outer quadrant Using sterile technique and 1% Lidocaine as local anesthetic, under direct ultrasound visualization, a 14 gauge spring-loaded device was used to perform biopsy of a mass with calcifications using a lateral approach. At the conclusion of the procedure ribbon tissue marker clip was deployed into the biopsy cavity. Follow up 2 view mammogram was performed and dictated separately. Please note after the first 2 core needle samples were obtained, a specimen radiograph was performed showing at  least 2 microcalcifications within those 2 samples. After that, a third core needle biopsy was taken, and not radiographed. IMPRESSION: Ultrasound guided biopsy of the left breast. No apparent complications. Electronically Signed: By: Curlene Dolphin M.D. On: 07/26/2019 16:04     ELIGIBLE FOR AVAILABLE RESEARCH PROTOCOL: no  ASSESSMENT: 49 y.o. [place] woman status post left breast upper outer quadrant biopsy 07/26/2019 for a clinical T1b-c N0, stage Ia invasive ductal carcinoma, grade 2, estrogen and progesterone receptor positive, HER-2 not amplified, with an MIB-1 of 2  (1) definitive surgery pending  (2) Oncotype to be obtained from the definitive surgical sample: Chemotherapy not anticipated  (3) adjuvant radiation  (4) antiestrogens  (5) genetics testing  PLAN: I met today  with Aahna to review her new diagnosis. Specifically we discussed the biology of her breast cancer, its diagnosis, staging, treatment  options and prognosis. We first reviewed the fact that cancer is not one disease but more than 100 different diseases and that it is important to keep them separate-- otherwise when friends and relatives discuss their own cancer experiences with Emiliana confusion can result. Similarly we explained that if breast cancer spreads to the bone or liver, the patient would not have bone cancer or liver cancer, but breast cancer in the bone and breast cancer in the liver: one cancer in three places-- not 3 different cancers which otherwise would have to be treated in 3 different ways.  We discussed the difference between local and systemic therapy. In terms of loco-regional treatment, lumpectomy plus radiation is equivalent to mastectomy as far as survival is concerned. For this reason, and because the cosmetic results are generally superior, we recommend breast conserving surgery.   We then discussed the rationale for systemic therapy. There is some risk that this cancer may have already spread to other parts of her body. Patients frequently ask at this point about bone scans, CAT scans and PET scans to find out if they have occult breast cancer somewhere else. The problem is that in early stage disease we are much more likely to find false positives then true cancers and this would expose the patient to unnecessary procedures as well as unnecessary radiation. Scans cannot answer the question the patient really would like to know, which is whether she has microscopic disease elsewhere in her body. For those reasons we do not recommend them.  Of course we would proceed to aggressive evaluation of any symptoms that might suggest metastatic disease, but that is not the case here.  Next we went over the options for systemic therapy which are anti-estrogens, anti-HER-2 immunotherapy, and  chemotherapy. Xiana does not meet criteria for anti-HER-2 immunotherapy. She is a good candidate for anti-estrogens.  The question of chemotherapy is more complicated. Chemotherapy is most effective in rapidly growing, aggressive tumors. It is much less effective in intermediate-grade, slow growing cancers, like Leslea 's. For that reason we are going to request an Oncotype from the definitive surgical sample, as suggested by NCCN guidelines. That will help [place] make a definitive decision regarding chemotherapy in this case.  We discussed how she can best let her children know the news she has received, and also the fact that even if she carries a deleterious mutation she is not committed to losing both breasts as intensified screening provides equal safety.  I scheduled her to see me after her surgery and radiation in anticipation of a likely low risk Oncotype.  Of course I would see her earlier if chemotherapy  were indicated  Lache has a good understanding of the overall plan. She agrees with it. She knows the goal of treatment in her case is cure. She will call with any problems that may develop before her next visit here.  Total encounter time 60 minutes.Chauncey Cruel, MD   08/01/2019 6:19 PM Medical Oncology and Hematology [facility] [place], [place] [address] Tel. [phone]    Fax. [phone]   This document serves as a record of services personally performed by Lurline Del, MD. It was created on his behalf by Wilburn Mylar, a trained medical scribe. The creation of this record is based on the scribe's personal observations and the provider's statements to them.   I, Lurline Del MD, have reviewed the above documentation for accuracy and completeness, and I agree with the above.    *Total Encounter Time as defined by the Centers for Medicare and Medicaid Services includes, in addition to the face-to-face time of a patient visit  (documented in the note above) non-face-to-face time: obtaining and reviewing outside history, ordering and reviewing medications, tests or procedures, care coordination (communications with other health care professionals or caregivers) and documentation in the medical record.

## 2019-08-01 ENCOUNTER — Encounter: Payer: Self-pay | Admitting: Physical Therapy

## 2019-08-01 ENCOUNTER — Encounter: Payer: Self-pay | Admitting: Radiation Oncology

## 2019-08-01 ENCOUNTER — Ambulatory Visit (HOSPITAL_BASED_OUTPATIENT_CLINIC_OR_DEPARTMENT_OTHER): Payer: BC Managed Care – PPO | Admitting: Genetic Counselor

## 2019-08-01 ENCOUNTER — Ambulatory Visit: Payer: BC Managed Care – PPO | Attending: General Surgery | Admitting: Physical Therapy

## 2019-08-01 ENCOUNTER — Ambulatory Visit
Admission: RE | Admit: 2019-08-01 | Discharge: 2019-08-01 | Disposition: A | Discharge status: Home / Self Care | Payer: BC Managed Care – PPO | Source: Ambulatory Visit | Attending: Radiation Oncology | Admitting: Radiation Oncology

## 2019-08-01 ENCOUNTER — Ambulatory Visit: Payer: BC Managed Care – PPO | Admitting: Radiation Oncology

## 2019-08-01 ENCOUNTER — Inpatient Hospital Stay: Payer: BC Managed Care – PPO

## 2019-08-01 ENCOUNTER — Encounter: Payer: Self-pay | Admitting: Oncology

## 2019-08-01 ENCOUNTER — Other Ambulatory Visit: Payer: Self-pay

## 2019-08-01 ENCOUNTER — Inpatient Hospital Stay: Payer: BC Managed Care – PPO | Attending: Hematology and Oncology | Admitting: Oncology

## 2019-08-01 VITALS — BP 122/94 | HR 83 | Temp 98.0°F | Resp 18 | Wt 146.2 lb

## 2019-08-01 DIAGNOSIS — Z8042 Family history of malignant neoplasm of prostate: Secondary | ICD-10-CM

## 2019-08-01 DIAGNOSIS — C50412 Malignant neoplasm of upper-outer quadrant of left female breast: Secondary | ICD-10-CM | POA: Diagnosis not present

## 2019-08-01 DIAGNOSIS — M19042 Primary osteoarthritis, left hand: Secondary | ICD-10-CM | POA: Diagnosis not present

## 2019-08-01 DIAGNOSIS — L409 Psoriasis, unspecified: Secondary | ICD-10-CM | POA: Diagnosis not present

## 2019-08-01 DIAGNOSIS — R293 Abnormal posture: Secondary | ICD-10-CM | POA: Insufficient documentation

## 2019-08-01 DIAGNOSIS — Z170 Estrogen receptor positive status [ER+]: Secondary | ICD-10-CM | POA: Insufficient documentation

## 2019-08-01 DIAGNOSIS — R233 Spontaneous ecchymoses: Secondary | ICD-10-CM | POA: Diagnosis not present

## 2019-08-01 DIAGNOSIS — Z72820 Sleep deprivation: Secondary | ICD-10-CM | POA: Insufficient documentation

## 2019-08-01 DIAGNOSIS — Z811 Family history of alcohol abuse and dependence: Secondary | ICD-10-CM | POA: Diagnosis not present

## 2019-08-01 DIAGNOSIS — Z818 Family history of other mental and behavioral disorders: Secondary | ICD-10-CM | POA: Diagnosis not present

## 2019-08-01 DIAGNOSIS — Z79899 Other long term (current) drug therapy: Secondary | ICD-10-CM | POA: Diagnosis not present

## 2019-08-01 DIAGNOSIS — Z8249 Family history of ischemic heart disease and other diseases of the circulatory system: Secondary | ICD-10-CM | POA: Diagnosis not present

## 2019-08-01 DIAGNOSIS — R634 Abnormal weight loss: Secondary | ICD-10-CM | POA: Diagnosis not present

## 2019-08-01 DIAGNOSIS — M19041 Primary osteoarthritis, right hand: Secondary | ICD-10-CM | POA: Diagnosis not present

## 2019-08-01 LAB — CMP (CANCER CENTER ONLY)
ALT: 13 U/L (ref 0–44)
AST: 15 U/L (ref 15–41)
Albumin: 4.1 g/dL (ref 3.5–5.0)
Alkaline Phosphatase: 48 U/L (ref 38–126)
Anion gap: 10 (ref 5–15)
BUN: 7 mg/dL (ref 6–20)
CO2: 29 mmol/L (ref 22–32)
Calcium: 9 mg/dL (ref 8.9–10.3)
Chloride: 102 mmol/L (ref 98–111)
Creatinine: 0.77 mg/dL (ref 0.44–1.00)
GFR, Est AFR Am: >60 mL/min (ref >60)
GFR, Estimated: >60 mL/min (ref >60)
Glucose, Bld: 88 mg/dL (ref 70–99)
Potassium: 3.1 mmol/L — ABNORMAL LOW (ref 3.5–5.1)
Sodium: 141 mmol/L (ref 135–145)
Total Bilirubin: 0.3 mg/dL (ref 0.3–1.2)
Total Protein: 7.1 g/dL (ref 6.5–8.1)

## 2019-08-01 LAB — CBC WITH DIFFERENTIAL (CANCER CENTER ONLY)
Abs Immature Granulocytes: 0 10*3/uL (ref 0.00–0.07)
Basophils Absolute: 0 10*3/uL (ref 0.0–0.1)
Basophils Relative: 0 %
Eosinophils Absolute: 0 10*3/uL (ref 0.0–0.5)
Eosinophils Relative: 0 %
HCT: 34.9 % — ABNORMAL LOW (ref 36.0–46.0)
Hemoglobin: 11.2 g/dL — ABNORMAL LOW (ref 12.0–15.0)
Immature Granulocytes: 0 %
Lymphocytes Relative: 32 %
Lymphs Abs: 1.1 10*3/uL (ref 0.7–4.0)
MCH: 27.6 pg (ref 26.0–34.0)
MCHC: 32.1 g/dL (ref 30.0–36.0)
MCV: 86 fL (ref 80.0–100.0)
Monocytes Absolute: 0.4 10*3/uL (ref 0.1–1.0)
Monocytes Relative: 11 %
Neutro Abs: 1.9 10*3/uL (ref 1.7–7.7)
Neutrophils Relative %: 57 %
Platelet Count: 313 10*3/uL (ref 150–400)
RBC: 4.06 MIL/uL (ref 3.87–5.11)
RDW: 12.6 % (ref 11.5–15.5)
WBC Count: 3.4 10*3/uL — ABNORMAL LOW (ref 4.0–10.5)
nRBC: 0 % (ref 0.0–0.2)

## 2019-08-01 LAB — GENETIC SCREENING ORDER

## 2019-08-01 NOTE — Patient Instructions (Signed)

## 2019-08-01 NOTE — Progress Notes (Signed)
Radiation Oncology         [phone] ________________________________  Name: Robin Castillo        MRN: [id number]  Date of Service: 08/01/2019 DOB: 10/04/1970  CC:[name], MD  Jovita Kussmaul, MD     REFERRING PHYSICIAN: Autumn Messing III, MD   DIAGNOSIS: The encounter diagnosis was Malignant neoplasm of upper-outer quadrant of left breast in female, estrogen receptor positive ([place]).   HISTORY OF PRESENT ILLNESS: Robin Castillo is a 49 y.o. female seen in the multidisciplinary breast clinic for a new diagnosis of left breast cancer. The patient was noted to have a palpable mass in the left breast.  She sought evaluation and diagnostic imaging revealed a mass at the 1 o'clock position of the left breast with calcifications the mass measured 1.2 cm, by ultrasound, the mass measured 9 x 8 x 7 mm, her axilla was negative for adenopathy. She did undergo ultrasound-guided biopsy on 07/26/2019 revealing a grade 2 invasive ductal carcinoma that was ER/PR positive, HER-2 was negative and Ki-67 was 2%.  She comes today to discuss options of treatment.     PREVIOUS RADIATION THERAPY: No   PAST MEDICAL HISTORY:  Past Medical History:  Diagnosis Date  . BV (bacterial vaginosis) 2012  . Cyst of ovary, right 2003  . GBS carrier   . H/O rubella   . H/O varicella   . History of PCOS 06/2005  . Hypertension   . Infertility, female   . Irregular menses 11/2001  . Leukorrhea 04/2007   phsiological  . Pregnancy induced hypertension   . Psychosocial stressors 10/2009       PAST SURGICAL HISTORY: Past Surgical History:  Procedure Laterality Date  . BREAST ENHANCEMENT SURGERY    . CESAREAN SECTION       FAMILY HISTORY:  Family History  Problem Relation Age of Onset  . Heart disease Mother   . Hypertension Mother   . Heart disease Father   . Depression Father   . Alcohol abuse Father      SOCIAL HISTORY:  reports that she has never smoked. She has never used smokeless tobacco. She  reports current alcohol use. She reports that she does not use drugs. The patient is married and works as a Merchandiser, retail and is accompanied by her friend Dr. Thurnell Lose from [place]. The patient has two teenage children.   ALLERGIES: Patient has no known allergies.   MEDICATIONS:  Current Outpatient Medications  Medication Sig Dispense Refill  . levonorgestrel (MIRENA) 20 MCG/24HR IUD 1 each by Intrauterine route once.    Marland Kitchen lisinopril-hydrochlorothiazide (PRINZIDE,ZESTORETIC) 10-12.5 MG per tablet Take 1 tablet by mouth daily.    . Norethindrone-Ethinyl Estradiol-Fe Biphas (LO LOESTRIN FE) 1 MG-10 MCG / 10 MCG tablet Take 1 tablet by mouth daily. 3 Package 0  . tinidazole (TINDAMAX) 500 MG tablet Take by mouth daily with breakfast.     No current facility-administered medications for this encounter.     REVIEW OF SYSTEMS: On review of systems, the patient reports that she is doing well overall. She is nervous about her diagnosis. She denies any chest pain, shortness of breath, cough, fevers, chills, night sweats, unintended weight changes. She denies any bowel or bladder disturbances, and denies abdominal pain, nausea or vomiting. She's had dysfunctional uterine bleeding off and on for several months and is in the process of having this worked up. She denies any new musculoskeletal or joint aches or pains. A complete review of systems  is obtained and is otherwise negative.     PHYSICAL EXAM:  Wt Readings from Last 3 Encounters:  09/12/16 148 lb (67.1 kg)  11/25/11 130 lb (59 kg)   Temp Readings from Last 3 Encounters:  No data found for Temp   BP Readings from Last 3 Encounters:  09/12/16 130/80  11/25/11 120/70   Pulse Readings from Last 3 Encounters:  No data found for Pulse   In general this is a well appearing African American female in no acute distress.  She's alert and oriented x4 and appropriate throughout the examination. Cardiopulmonary assessment is negative  for acute distress and she exhibits normal effort. Bilateral breast exam is deferred.    ECOG = 1  0 - Asymptomatic (Fully active, able to carry on all predisease activities without restriction)  1 - Symptomatic but completely ambulatory (Restricted in physically strenuous activity but ambulatory and able to carry out work of a light or sedentary nature. For example, light housework, office work)  2 - Symptomatic, <50% in bed during the day (Ambulatory and capable of all self care but unable to carry out any work activities. Up and about more than 50% of waking hours)  3 - Symptomatic, >50% in bed, but not bedbound (Capable of only limited self-care, confined to bed or chair 50% or more of waking hours)  4 - Bedbound (Completely disabled. Cannot carry on any self-care. Totally confined to bed or chair)  5 - Death   Eustace Pen MM, Creech RH, Tormey DC, et al. [phone]). "Toxicity and response criteria of the [facility]". [place] Oncol. 5 (6): 649-55    LABORATORY DATA:  No results found for: WBC, HGB, HCT, MCV, PLT No results found for: NA, K, CL, CO2 No results found for: ALT, AST, GGT, ALKPHOS, BILITOT    RADIOGRAPHY: MM CLIP PLACEMENT LEFT  Result Date: 07/26/2019 CLINICAL DATA:  Worsened guided core needle biopsy was performed of a palpable mass containing internal heterogeneous coarse calcifications in the 1 o'clock position of the left breast. EXAM: DIAGNOSTIC LEFT MAMMOGRAM POST ULTRASOUND BIOPSY COMPARISON:  Previous exam(s). FINDINGS: Mammographic images were obtained following ultrasound guided biopsy of the left breast. The biopsy marking clip is in expected position at the site of biopsy. IMPRESSION: Appropriate positioning of the ribbon shaped biopsy marking clip at the site of biopsy in the upper outer quadrant. Final Assessment: Post Procedure Mammograms for Marker Placement Electronically Signed   By: Curlene Dolphin M.D.   On: 07/26/2019 16:07   [place] LT  BREAST BX W LOC DEV 1ST LESION IMG BX SPEC US GUIDE  Addendum Date: 07/30/2019   ADDENDUM REPORT: 07/27/2019 12:57 ADDENDUM: Pathology revealed GRADE II INVASIVE DUCTAL CARCINOMA of the LEFT breast, 1 o'clock, 3 to 4 cm from nipple. This was found to be concordant by Dr. Curlene Dolphin. Pathology results were discussed with the patient by telephone. The patient reported doing well after the biopsy with tenderness at the site. Post biopsy instructions and care were reviewed and questions were answered. The patient was encouraged to call [facility] for any additional concerns. The patient was referred to [facility] on August 01, 2019. Pathology results reported by Stacie Acres RN on 07/27/2019. Electronically Signed   By: Curlene Dolphin M.D.   On: 07/27/2019 12:57   Result Date: 07/30/2019 CLINICAL DATA:  Ultrasound-guided core needle biopsy was performed of a palpable hypoechoic mass  with internal calcification in the 1 o'clock position of the left breast. EXAM: ULTRASOUND GUIDED LEFT BREAST CORE NEEDLE BIOPSY COMPARISON:  Previous exam(s). FINDINGS: I met with the patient and we discussed the procedure of ultrasound-guided biopsy, including benefits and alternatives. We discussed the high likelihood of a successful procedure. We discussed the risks of the procedure, including infection, bleeding, tissue injury, clip migration, and inadequate sampling. Informed written consent was given. The usual time-out protocol was performed immediately prior to the procedure. Lesion quadrant: Upper outer quadrant Using sterile technique and 1% Lidocaine as local anesthetic, under direct ultrasound visualization, a 14 gauge spring-loaded device was used to perform biopsy of a mass with calcifications using a lateral approach. At the conclusion of the procedure ribbon tissue marker clip was deployed into the biopsy cavity.  Follow up 2 view mammogram was performed and dictated separately. Please note after the first 2 core needle samples were obtained, a specimen radiograph was performed showing at least 2 microcalcifications within those 2 samples. After that, a third core needle biopsy was taken, and not radiographed. IMPRESSION: Ultrasound guided biopsy of the left breast. No apparent complications. Electronically Signed: By: Curlene Dolphin M.D. On: 07/26/2019 16:04       IMPRESSION/PLAN: 1. Stage IB, [id number] grade 2, ER/PR positive invasive ductal carcinoma of the left breast. I met with the patient and we discussed the pathology findings and reviews the nature of left breast disease. The consensus from the breast conference includes breast conservation with lumpectomy with sentinel node biopsy. Dr. [name] may order an Oncotype Dx score to determine a role for systemic therapy depending on the size of her tumor at final pathology. Provided that chemotherapy is not indicated, the patient's course would then be followed by external radiotherapy to the breast followed by antiestrogen therapy. We discussed the risks, benefits, short, and long term effects of radiotherapy, and the patient is interested in proceeding. Dr. Lisbeth Renshaw has reviewed her case and we discussed the delivery and logistics of radiotherapy and anticipates a course of 6 1/2 weeks of radiotherapy with deep inspiration breath hold technique. We will see her back about 2 weeks after surgery to discuss the simulation process and anticipate we starting radiotherapy about 4-6 weeks after surgery.  2. Contraceptive Counseling. The patient is not taking oral contraceptives but is using condoms for contraception. She is also being worked up for dysfunctional uterine bleeding. She will also discuss this with Dr. [name] but she is aware she would need to avoid pregnancy during radiotherapy and we will revisit this at the appropriate time.    In a visit lasting 60  minutes, greater than 50% of the time was spent face to face discussing her case, and coordinating the patient's care.     Carola Rhine, PAC

## 2019-08-01 NOTE — Therapy (Signed)
[facility], [place], [address] Phone: [phone]   Fax:  [phone]  Physical Therapy Evaluation  Patient Details  Name: Robin Castillo MRN: [id number] Date of Birth: 03-Apr-1971 Referring Provider (PT): Dr. [name]   Encounter Date: 08/01/2019  PT End of Session - 08/01/19 1405    Visit Number  1    Number of Visits  2    Date for PT Re-Evaluation  09/26/19    PT Start Time  1322    PT Stop Time  1350    PT Time Calculation (min)  28 min    Activity Tolerance  Patient tolerated treatment well    Behavior During Therapy  [facility] for tasks assessed/performed       Past Medical History:  Diagnosis Date  . BV (bacterial vaginosis) 2012  . Cyst of ovary, right 2003  . GBS carrier   . H/O rubella   . H/O varicella   . History of PCOS 06/2005  . Hypertension   . Infertility, female   . Irregular menses 11/2001  . Leukorrhea 04/2007   phsiological  . Pregnancy induced hypertension   . Psychosocial stressors 10/2009    Past Surgical History:  Procedure Laterality Date  . BREAST ENHANCEMENT SURGERY    . CESAREAN SECTION      There were no vitals filed for this visit.   Subjective Assessment - 08/01/19 1356    Subjective  Patient reports she is here today to be seen by her medical team for her newly diagnosed left breast cancer.    Patient is accompained by:  Family member    Pertinent History  Patient was diagnosed on 07/25/2019 with left grade II invasive ductal carcinoma breast cancer. It measures 9 mm and is located in the upper outer quadrant. It is ER/PR positive and HER2 negative with a Ki67 of 2%.    Patient Stated Goals  reduce lymphedema risk and learn post op shoulder ROM HEP    Currently in Pain?  No/denies         [facility] PT Assessment - 08/01/19 0001      Assessment   Medical Diagnosis  Left breast cancer    Referring Provider (PT)  Dr. [name]    Onset  Date/Surgical Date  07/25/19    Hand Dominance  Right    Prior Therapy  none      Precautions   Precautions  Other (comment)    Precaution Comments  active cancer      Restrictions   Weight Bearing Restrictions  No      Balance Screen   Has the patient fallen in the past 6 months  No    Has the patient had a decrease in activity level because of a fear of falling?   No    Is the patient reluctant to leave their home because of a fear of falling?   No      Home Environment   Living Environment  Private residence    Living Arrangements  Children;Spouse/significant other   Husband, 69 and 45 y.o. kids   Available Help at Discharge  Family      Prior Function   Level of Independence  Independent    Vocation  Full time employment    Writer nurse    Leisure  She walks 45 minutes twice a week      Cognition   Overall Cognitive Status  Within Functional Limits  for tasks assessed      Posture/Postural Control   Posture/Postural Control  Postural limitations    Postural Limitations  Rounded Shoulders;Forward head      ROM / Strength   AROM / PROM / Strength  AROM;Strength      AROM   Overall AROM Comments  Cervical AROM is WNL    AROM Assessment Site  Shoulder    Right/Left Shoulder  Right;Left    Right Shoulder Extension  51 Degrees    Right Shoulder Flexion  158 Degrees    Right Shoulder ABduction  165 Degrees    Right Shoulder Internal Rotation  67 Degrees    Right Shoulder External Rotation  81 Degrees    Left Shoulder Extension  56 Degrees    Left Shoulder Flexion  152 Degrees    Left Shoulder ABduction  162 Degrees    Left Shoulder Internal Rotation  68 Degrees    Left Shoulder External Rotation  77 Degrees      Strength   Overall Strength  Within functional limits for tasks performed        LYMPHEDEMA/ONCOLOGY QUESTIONNAIRE - 08/01/19 1402      Type   Cancer Type  Left breast cancer      Lymphedema Assessments   Lymphedema  Assessments  Upper extremities      Right Upper Extremity Lymphedema   10 cm Proximal to Olecranon Process  25.9 cm    Olecranon Process  23.8 cm    10 cm Proximal to Ulnar Styloid Process  22 cm    Just Proximal to Ulnar Styloid Process  16.8 cm    Across Hand at [employer]  20.8 cm    At [place] of 2nd Digit  6.8 cm      Left Upper Extremity Lymphedema   10 cm Proximal to Olecranon Process  24.6 cm    Olecranon Process  23.5 cm    10 cm Proximal to Ulnar Styloid Process  20.9 cm    Just Proximal to Ulnar Styloid Process  16.3 cm    Across Hand at [employer]  20.9 cm    At [place] of 2nd Digit  6.6 cm          Quick Dash - 08/01/19 0001    Open a tight or new jar  No difficulty    Do heavy household chores (wash walls, wash floors)  No difficulty    Carry a shopping bag or briefcase  No difficulty    Wash your back  No difficulty    Use a knife to cut food  No difficulty    Recreational activities in which you take some force or impact through your arm, shoulder, or hand (golf, hammering, tennis)  No difficulty    During the past week, to what extent has your arm, shoulder or hand problem interfered with your normal social activities with family, friends, neighbors, or groups?  Not at all    During the past week, to what extent has your arm, shoulder or hand problem limited your work or other regular daily activities  Not at all    Arm, shoulder, or hand pain.  None    Tingling (pins and needles) in your arm, shoulder, or hand  Mild    Difficulty Sleeping  No difficulty    DASH Score  2.27 %        Objective measurements completed on examination: See above findings.  Patient was instructed today in a home exercise program today for post op shoulder range of motion. These included active assist shoulder flexion in sitting, scapular retraction, wall walking with shoulder abduction, and hands behind head external rotation.  She was encouraged to do these twice a  day, holding 3 seconds and repeating 5 times when permitted by her physician.          PT Education - 08/01/19 1403    Education Details  Lymphedema risk reduction and post op shoulder ROM HEP    Person(s) Educated  Patient;Other (comment)   sister-in-law   Methods  Explanation;Demonstration;Handout    Comprehension  Verbalized understanding;Returned demonstration          PT Long Term Goals - 08/01/19 1435      PT LONG TERM GOAL #1   Title  Patient will demonstrate she has regained full shoulder ROM and function post operatively compared to baselines.    Time  8    Period  Weeks    Status  New    Target Date  09/26/19      Breast Clinic Goals - 08/01/19 1435      Patient will be able to verbalize understanding of pertinent lymphedema risk reduction practices relevant to her diagnosis specifically related to skin care.   Time  1    Period  Days    Status  Achieved      Patient will be able to return demonstrate and/or verbalize understanding of the post-op home exercise program related to regaining shoulder range of motion.   Time  1    Period  Days    Status  Achieved      Patient will be able to verbalize understanding of the importance of attending the postoperative After Breast Cancer Class for further lymphedema risk reduction education and therapeutic exercise.   Time  1    Period  Days    Status  Achieved            Plan - 08/01/19 1405    Clinical Impression Statement  Patient was diagnosed on 07/25/2019 with left grade II invasive ductal carcinoma breast cancer. It measures 9 mm and is located in the upper outer quadrant. It is ER/PR positive and HER2 negative with a Ki67 of 2%. Her multidisciplinary medical team met prior to her assessments to determine a recommended treatment plan. She is planning to have a left lumpectomy and sentinel node biopsy followed by Oncotype testing, radiation, and anti-estrogen therapy. She will benefit from a post op PT  reassessment to determine needs.    Stability/Clinical Decision Making  Stable/Uncomplicated    Clinical Decision Making  Low    Rehab Potential  Excellent    PT Frequency  --   Eval and 1 f/u visit   PT Treatment/Interventions  ADLs/Self Care Home Management;Therapeutic exercise;Patient/family education    PT Next Visit Plan  Will reassess 3-4 weeks post op to determine needs    PT Home Exercise Plan  Post op shoulder ROM HEP    Consulted and Agree with Plan of Care  Patient;Family member/caregiver    Family Member Consulted  sister-in-law       Patient will benefit from skilled therapeutic intervention in order to improve the following deficits and impairments:  Postural dysfunction, Decreased range of motion, Pain, Impaired UE functional use, Decreased knowledge of precautions  Visit Diagnosis: Malignant neoplasm of upper-outer quadrant of left breast in female, estrogen receptor positive ([place]) - Plan: PT plan  of care cert/re-cert  Abnormal posture - Plan: PT plan of care cert/re-cert   Patient will follow up at outpatient cancer rehab 3-4 weeks following surgery.  If the patient requires physical therapy at that time, a specific plan will be dictated and sent to the referring physician for approval. The patient was educated today on appropriate basic range of motion exercises to begin post operatively and the importance of attending the After Breast Cancer class following surgery.  Patient was educated today on lymphedema risk reduction practices as it pertains to recommendations that will benefit the patient immediately following surgery.  She verbalized good understanding.      Problem List Patient Active Problem List   Diagnosis Date Noted  . Malignant neoplasm of upper-outer quadrant of left breast in female, estrogen receptor positive ([place]) 07/30/2019   Annia Friendly, PT 08/01/19 2:38 PM  [place], [place], [address] Phone: [phone]   Fax:  [phone]  Name: Robin Castillo MRN: [id number] Date of Birth: 31-Oct-1970

## 2019-08-02 ENCOUNTER — Encounter: Payer: Self-pay | Admitting: Genetic Counselor

## 2019-08-02 ENCOUNTER — Telehealth: Payer: Self-pay | Admitting: Oncology

## 2019-08-02 ENCOUNTER — Encounter: Payer: Self-pay | Admitting: General Practice

## 2019-08-02 ENCOUNTER — Other Ambulatory Visit: Payer: Self-pay | Admitting: General Surgery

## 2019-08-02 ENCOUNTER — Ambulatory Visit
Admission: RE | Admit: 2019-08-02 | Discharge: 2019-08-02 | Disposition: A | Discharge status: Home / Self Care | Payer: BC Managed Care – PPO | Source: Ambulatory Visit | Attending: Obstetrics and Gynecology | Admitting: Obstetrics and Gynecology

## 2019-08-02 ENCOUNTER — Other Ambulatory Visit: Payer: Self-pay

## 2019-08-02 DIAGNOSIS — C50412 Malignant neoplasm of upper-outer quadrant of left female breast: Secondary | ICD-10-CM

## 2019-08-02 DIAGNOSIS — Z170 Estrogen receptor positive status [ER+]: Secondary | ICD-10-CM

## 2019-08-02 DIAGNOSIS — N950 Postmenopausal bleeding: Secondary | ICD-10-CM

## 2019-08-02 DIAGNOSIS — Z8042 Family history of malignant neoplasm of prostate: Secondary | ICD-10-CM | POA: Insufficient documentation

## 2019-08-02 NOTE — Telephone Encounter (Signed)
I talk with patient regarding schedule  

## 2019-08-02 NOTE — Progress Notes (Signed)
REFERRING PROVIDER: Chauncey Cruel, MD [address] [place],  [place] [address]  PRIMARY PROVIDER:  [name], MD  PRIMARY REASON FOR VISIT:  1. Malignant neoplasm of upper-outer quadrant of left breast in female, estrogen receptor positive ([place])   2. Family history of prostate cancer in father      I connected with Ms. Sok on 08/02/2019 at 4:30 pm EDT by Webex video conference and verified that I am speaking with the correct person using two identifiers.   Patient location: [facility] clinic Provider location: [facility] office  HISTORY OF PRESENT ILLNESS:   Ms. Tompkins, a 49 y.o. female, was seen for a [place] cancer genetics consultation at the request of Dr. [name] due to a personal history of breast cancer.  Ms. Radin presents to clinic today to discuss the possibility of a hereditary predisposition to cancer, genetic testing, and to further clarify her future cancer risks, as well as potential cancer risks for family members.   In 2021, at the age of 37, Ms. Kistler was diagnosed with IDC, ER+/PR+/Her2-, of the left breast. The treatment plan includes surgery, oncotype, adjuvant radiation, antiestrogens, and genetic testing.     CANCER HISTORY:  Oncology History   No history exists.     RISK FACTORS:  Menarche was at age 53.  First live birth at age 72.  OCP use for approximately 20 years.  Ovaries intact: yes.  Hysterectomy: no.  Menopausal status: premenopausal.  HRT use: 0 years. Colonoscopy: no. Mammogram within the last year: yes. Any excessive radiation exposure in the past: no  Past Medical History:  Diagnosis Date  . BV (bacterial vaginosis) 2012  . Cyst of ovary, right 2003  . Family history of prostate cancer in father   . GBS carrier   . H/O rubella   . H/O varicella   . History of PCOS 06/2005  . Hypertension   . Infertility, female   . Irregular menses 11/2001  . Leukorrhea 04/2007   phsiological  . Pregnancy induced  hypertension   . Psychosocial stressors 10/2009    Past Surgical History:  Procedure Laterality Date  . BREAST ENHANCEMENT SURGERY    . CESAREAN SECTION      Social History   Socioeconomic History  . Marital status: Married    Spouse name: Not on file  . Number of children: Not on file  . Years of education: Not on file  . Highest education level: Not on file  Occupational History  . Not on file  Tobacco Use  . Smoking status: Never Smoker  . Smokeless tobacco: Never Used  Substance and Sexual Activity  . Alcohol use: Yes    Comment: Glass of champagne once a month  . Drug use: No  . Sexual activity: Yes    Birth control/protection: I.U.D.  Other Topics Concern  . Not on file  Social History Narrative  . Not on file   Social Determinants of Health   Financial Resource Strain:   . Difficulty of Paying Living Expenses: Not on file  Food Insecurity:   . Worried About Charity fundraiser in the Last Year: Not on file  . Ran Out of Food in the Last Year: Not on file  Transportation Needs:   . Lack of Transportation (Medical): Not on file  . Lack of Transportation (Non-Medical): Not on file  Physical Activity:   . Days of Exercise per Week: Not on file  . Minutes of Exercise per Session: Not on  file  Stress:   . Feeling of Stress : Not on file  Social Connections:   . Frequency of Communication with Friends and Family: Not on file  . Frequency of Social Gatherings with Friends and Family: Not on file  . Attends Religious Services: Not on file  . Active Member of Clubs or Organizations: Not on file  . Attends Archivist Meetings: Not on file  . Marital Status: Not on file     FAMILY HISTORY:  We obtained a detailed, 4-generation family history.  Significant diagnoses are listed below: Family History  Problem Relation Age of Onset  . Heart disease Mother   . Hypertension Mother   . Heart disease Father   . Depression Father   . Alcohol abuse Father     . Prostate cancer Father 15  . Heart disease Maternal Grandmother   . Heart disease Maternal Grandfather    Ms. Knueppel has two sons, ages 75 and 49. She has one sister who is 57 and has not had cancer, although she did have her ovaries removed due to tumors on her ovaries (Ms. Paul is unsure what kind of tumors these were).   Ms. Fuhriman mother is currently 58 and does not have a history of cancer. She has four maternal uncles and two maternal aunts, none of whom have had cancer. Her maternal grandparents died in their early- to mid-60s due to heart disease. There are no known diagnoses of cancer on the maternal side of the family.  Ms. Dehaven father is currently living at age 29 and was recently diagnosed with prostate cancer. This prostate cancer was treated with radiation and had not metastasized. Ms. Hirota has two paternal uncles. She has limited information about these family members but believes they died when they were older than 69. She is not sure if they have had cancer. Her paternal grandparents died when they were older than 85. There are no other known diagnoses of cancer on the paternal side of the family.  Ms. Heringer is unaware of previous family history of genetic testing for hereditary cancer risks. Her maternal and paternal ancestors are of Native American descent. There is no reported Ashkenazi Jewish ancestry. There is no known consanguinity.  GENETIC COUNSELING ASSESSMENT: Ms. Montone is a 49 y.o. female with a personal history of breast cancer and a family history of prostate cancer, which is somewhat suggestive of a hereditary cancer syndrome and predisposition to cancer. We, therefore, discussed and recommended the following at today's visit.   DISCUSSION: We discussed that 5 - 10% of breast cancer is hereditary, with most cases associated with the BRCA1 and BRCA2 genes.  There are other genes that can be associated with hereditary breast cancer syndromes.   These include ATM, CHEK2, PALB2, etc.  We discussed that testing is beneficial for several reasons including knowing about other cancer risks, identifying potential screening and risk-reduction options that may be appropriate, and to understand if other family members could be at risk for cancer and allow them to undergo genetic testing.   We reviewed the characteristics, features and inheritance patterns of hereditary cancer syndromes. We also discussed genetic testing, including the appropriate family members to test, the process of testing, insurance coverage and turn-around-time for results. We discussed the implications of a negative, positive and/or variant of uncertain significant result. In order to get genetic test results in a timely manner so that Ms. Dilling can use these genetic test results for surgical decisions, we recommended  Ms. Gritz pursue genetic testing for the [facility] Breast Cancer STAT panel. Once complete, we recommend Ms. Kiesel pursue reflex genetic testing to the Common Hereditary Cancers panel.   The STAT Breast cancer panel offered by [facility] includes sequencing and rearrangement analysis for the following 9 genes:  ATM, BRCA1, BRCA2, CDH1, CHEK2, PALB2, PTEN, STK11 and TP53.     The Common Hereditary Cancers Panel offered by [facility] includes sequencing and/or deletion duplication testing of the following 48 genes: APC, ATM, AXIN2, BARD1, BMPR1A, BRCA1, BRCA2, BRIP1, CDH1, CDK4, CDKN2A (p14ARF), CDKN2A (p16INK4a), CHEK2, CTNNA1, DICER1, EPCAM (Deletion/duplication testing only), GREM1 (promoter region deletion/duplication testing only), KIT, MEN1, MLH1, MSH2, MSH3, MSH6, MUTYH, NBN, NF1, NHTL1, PALB2, PDGFRA, PMS2, POLD1, POLE, PTEN, RAD50, RAD51C, RAD51D, RNF43, SDHB, SDHC, SDHD, SMAD4, SMARCA4. STK11, TP53, TSC1, TSC2, and VHL.  The following genes were evaluated for sequence changes only: SDHA and HOXB13 c.251G>A variant only.   Based on Ms. Miyasato's personal and  family history of cancer, she meets medical criteria for genetic testing. Despite that she meets criteria, she may still have an out of pocket cost.   PLAN: After considering the risks, benefits, and limitations, Ms. Desjardin provided informed consent to pursue genetic testing and the blood sample was sent to [facility] for analysis of the Breast Cancer STAT panel + Common Hereditary Cancers panel. Results should be available within approximately one-two weeks' time, at which point they will be disclosed by telephone to Ms. Gage, as will any additional recommendations warranted by these results. Ms. Lacomb will receive a summary of her genetic counseling visit and a copy of her results once available. This information will also be available in Epic.   Ms. Weider questions were answered to her satisfaction today. Our contact information was provided should additional questions or concerns arise. Thank you for the referral and allowing [place] to share in the care of your patient.   Clint Guy, MS, [facility] Genetic Counselor [email or web address] Phone: [phone]  The patient was seen for a total of 20 minutes in face-to-face genetic counseling.  This patient was discussed with Drs. Magrinat, Lindi Adie and/or Burr Medico who agrees with the above.    _______________________________________________________________________ For Office Staff:  Number of people involved in session: 2 Was an Intern/ student involved with case: no

## 2019-08-02 NOTE — Progress Notes (Signed)
[place] Psychosocial Distress Screening Spiritual Care  Met with Robin Castillo and a close friend in [facility] to introduce [place] team/resources, reviewing distress screen per protocol.  The patient scored a 9 on the Psychosocial Distress Thermometer which indicates severe distress. Also assessed for distress and other psychosocial needs.   ONCBCN DISTRESS SCREENING 08/02/2019  Screening Type Initial Screening  Distress experienced in past week (1-10) 9  Family Problem type Other (comment)  Physical Problem type Sleep/insomnia  Referral to support programs Yes   Robin Castillo was in good spirits by the end of [facility], citing relief at meeting her team, learning more about the scope of her diagnosis and treatment plan, and overall resolving much of the anxiety-provoking unknown. She finds meaning and purpose in her work with [employer] [facility], and has close people at her side to encourage her along the way.  Follow up needed: Yes.  Because of time and energy limitations, we plan to follow up by phone to discuss [place] team/programming resources and any concerns she may have in more detail. I will call to ensure that she has my direct dial number and to schedule a follow-up conversation, if needed.   [place], [place], [facility] Pager [phone] Voicemail [phone]

## 2019-08-02 NOTE — Progress Notes (Signed)
[place] Spiritual Care Note  Robin Castillo by phone for follow-up support. She was upbeat, "ready to get the show on the road," and using gratitude and perspective to cope, understanding that "it could be much worse." This position of power helps her regain a sense of control and will serve her well as she prepares for next steps in her treatment plan. Robin Castillo has my direct dial number and knows to call anytime.   [place], [place], [facility] Pager [phone] Voicemail [phone]

## 2019-08-03 ENCOUNTER — Other Ambulatory Visit: Payer: Self-pay | Admitting: General Surgery

## 2019-08-03 DIAGNOSIS — Z170 Estrogen receptor positive status [ER+]: Secondary | ICD-10-CM

## 2019-08-03 DIAGNOSIS — C50412 Malignant neoplasm of upper-outer quadrant of left female breast: Secondary | ICD-10-CM

## 2019-08-06 ENCOUNTER — Other Ambulatory Visit: Payer: BC Managed Care – PPO

## 2019-08-08 ENCOUNTER — Telehealth: Payer: Self-pay

## 2019-08-08 NOTE — Telephone Encounter (Signed)
Nutrition Assessment  Reason for Assessment:  Pt attended Breast Clinic on 08/01/19 and was given nutrition packet from nurse navigator.  ASSESSMENT:  48 year old female new diagnosis of breast cancer.  Planning surgery, oncotype, radiatoin and antiestrogens.    Spoke with patient via phone to introduce self and service at [facility].  Patient reports that for the first week after diagnosis her appetite was poor and lost weight due to news of having cancer.  Reports that she has improved and wanting to get this cancer out of her.    Medications:  reviewed  Labs: reviewed  Anthropometrics:   Height: 64 inches Weight: 146 lb 3.2 oz BMI: 25   NUTRITION DIAGNOSIS: Food and nutrition related knowledge deficit related to new diagnosis of breast cancer as evidenced by no prior need for nutrition related information.  INTERVENTION:   Discussed briefly packet of information regarding nutritional tips for breast cancer patients.  Questions answered.   Contact information provided and patient knows to contact me with questions/concerns.    MONITORING, EVALUATION, and GOAL: Pt will consume a healthy plant based diet to maintain lean body mass throughout treatment.   Robin Castillo, Robin Castillo, Robin Castillo Registered Dietitian [phone] (pager)

## 2019-08-09 ENCOUNTER — Telehealth: Payer: Self-pay | Admitting: *Deleted

## 2019-08-09 NOTE — Telephone Encounter (Signed)
Left message for a return phone call to follow up with patient from [facility].

## 2019-08-10 ENCOUNTER — Encounter: Payer: Self-pay | Admitting: Genetic Counselor

## 2019-08-10 ENCOUNTER — Telehealth: Payer: Self-pay | Admitting: Genetic Counselor

## 2019-08-10 ENCOUNTER — Ambulatory Visit: Payer: Self-pay | Admitting: Genetic Counselor

## 2019-08-10 DIAGNOSIS — Z1379 Encounter for other screening for genetic and chromosomal anomalies: Secondary | ICD-10-CM

## 2019-08-10 NOTE — Telephone Encounter (Signed)
LVM that her genetic test results are available and requested that she call back to discuss them.  

## 2019-08-10 NOTE — Progress Notes (Signed)
HPI:  Robin Castillo was previously seen in the [place] clinic due to a personal history of breast cancer and a family history of prostate cancer, and concerns regarding a hereditary predisposition to cancer. Please refer to our prior cancer genetics clinic note for more information regarding our discussion, assessment and recommendations, at the time. Robin Castillo recent genetic test results were disclosed to her, as were recommendations warranted by these results. These results and recommendations are discussed in more detail below.  CANCER HISTORY:  Oncology History  Malignant neoplasm of upper-outer quadrant of left breast in female, estrogen receptor positive ([place])  07/30/2019 Initial Diagnosis   Malignant neoplasm of upper-outer quadrant of left breast in female, estrogen receptor positive ([place])   08/09/2019 Genetic Testing   Negative genetic testing:  No pathogenic variants detected on the [facility] Breast Cancer STAT or Common Hereditary Cancers panel. A variant of uncertain significance was detected in the MSH3 gene called c.803G>A (p.Arg268Gln). The report date is 08/09/2019.  The STAT Breast cancer panel offered by [facility] includes sequencing and rearrangement analysis for the following 9 genes:  ATM, BRCA1, BRCA2, CDH1, CHEK2, PALB2, PTEN, STK11 and TP53.  The Common Hereditary Cancers Panel offered by [facility] includes sequencing and/or deletion duplication testing of the following 48 genes: APC, ATM, AXIN2, BARD1, BMPR1A, BRCA1, BRCA2, BRIP1, CDH1, CDK4, CDKN2A (p14ARF), CDKN2A (p16INK4a), CHEK2, CTNNA1, DICER1, EPCAM (Deletion/duplication testing only), GREM1 (promoter region deletion/duplication testing only), KIT, MEN1, MLH1, MSH2, MSH3, MSH6, MUTYH, NBN, NF1, NHTL1, PALB2, PDGFRA, PMS2, POLD1, POLE, PTEN, RAD50, RAD51C, RAD51D, RNF43, SDHB, SDHC, SDHD, SMAD4, SMARCA4. STK11, TP53, TSC1, TSC2, and VHL.  The following genes were evaluated for sequence changes only: SDHA and  HOXB13 c.251G>A variant only.      FAMILY HISTORY:  We obtained a detailed, 4-generation family history.  Significant diagnoses are listed below: Family History  Problem Relation Age of Onset  . Heart disease Mother   . Hypertension Mother   . Heart disease Father   . Depression Father   . Alcohol abuse Father   . Prostate cancer Father 36  . Heart disease Maternal Grandmother   . Heart disease Maternal Grandfather    Robin Castillo has two sons, ages 36 and 40. She has one sister who is 7 and has not had cancer, although she did have her ovaries removed due to tumors on her ovaries (Robin Castillo is unsure what kind of tumors these were).   Robin Castillo mother is currently 30 and does not have a history of cancer. She has four maternal uncles and two maternal aunts, none of whom have had cancer. Her maternal grandparents died in their early- to mid-60s due to heart disease. There are no known diagnoses of cancer on the maternal side of the family.  Robin Castillo father is currently living at age 77 and was recently diagnosed with prostate cancer. This prostate cancer was treated with radiation and had not metastasized. Robin Castillo has two paternal uncles. She has limited information about these family members but believes they died when they were older than 80. She is not sure if they have had cancer. Her paternal grandparents died when they were older than 34. There are no other known diagnoses of cancer on the paternal side of the family.  Robin Castillo is unaware of previous family history of genetic testing for hereditary cancer risks. Her maternal and paternal ancestors are of Native American descent. There is no reported Ashkenazi Jewish ancestry. There is no known  consanguinity.  GENETIC TEST RESULTS: Genetic testing reported out on 08/09/2019 through the [facility] Breast Cancer STAT panel and Common Hereditary Cancers panel. No pathogenic variants were detected.   The STAT Breast  cancer panel offered by [facility] includes sequencing and rearrangement analysis for the following 9 genes:  ATM, BRCA1, BRCA2, CDH1, CHEK2, PALB2, PTEN, STK11 and TP53. The Common Hereditary Cancers Panel offered by [facility] includes sequencing and/or deletion duplication testing of the following 48 genes: APC, ATM, AXIN2, BARD1, BMPR1A, BRCA1, BRCA2, BRIP1, CDH1, CDK4, CDKN2A (p14ARF), CDKN2A (p16INK4a), CHEK2, CTNNA1, DICER1, EPCAM (Deletion/duplication testing only), GREM1 (promoter region deletion/duplication testing only), KIT, MEN1, MLH1, MSH2, MSH3, MSH6, MUTYH, NBN, NF1, NHTL1, PALB2, PDGFRA, PMS2, POLD1, POLE, PTEN, RAD50, RAD51C, RAD51D, RNF43, SDHB, SDHC, SDHD, SMAD4, SMARCA4. STK11, TP53, TSC1, TSC2, and VHL.  The following genes were evaluated for sequence changes only: SDHA and HOXB13 c.251G>A variant only. The test report will be scanned into EPIC and located under the Molecular Pathology section of the Results Review tab.  A portion of the result report is included below for reference.     We discussed with Robin Castillo that because current genetic testing is not perfect, it is possible there may be a gene mutation in one of these genes that current testing cannot detect, but that chance is small.  We also discussed, that there could be another gene that has not yet been discovered, or that we have not yet tested, that is responsible for the cancer diagnoses in the family. Therefore, it is important to remain in touch with cancer genetics in the future so that we can continue to offer Robin Castillo the most up to date genetic testing.   Genetic testing did identify a variant of uncertain significance (VUS) in the MSH3 gene called c.803G>A (p.Arg268Gln).  At this time, it is unknown if this variant is associated with increased cancer risk or if this is a normal finding, but most variants such as this get reclassified to being inconsequential. It should not be used to make medical management  decisions. With time, we suspect the lab will determine the significance of this variant, if any. If we do learn more about it, we will try to contact Robin Castillo to discuss it further. However, it is important to stay in touch with [place] periodically and keep the address and phone number up to date.  CANCER SCREENING RECOMMENDATIONS: Robin Castillo test result is considered negative (normal).  This means that we have not identified a hereditary cause for her personal and family history of cancer at this time. Most cancers happen by chance and this negative test suggests that her personal and family of cancer may fall into this category.    While reassuring, this does not definitively rule out a hereditary predisposition to cancer. It is still possible that there could be genetic mutations that are undetectable by current technology. There could be genetic mutations in genes that have not been tested or identified to increase cancer risk.  Therefore, it is recommended she continue to follow the cancer management and screening guidelines provided by her oncology and primary healthcare provider.   An individual's cancer risk and medical management are not determined by genetic test results alone. Overall cancer risk assessment incorporates additional factors, including personal medical history, family history, and any available genetic information that may result in a personalized plan for cancer prevention and surveillance.  RECOMMENDATIONS FOR FAMILY MEMBERS:  Individuals in this family might be at some increased risk of  developing cancer, over the general population risk, simply due to the family history of cancer.  We recommended women in this family have a yearly mammogram beginning at age 28, or 51 years younger than the earliest onset of cancer, an annual clinical breast exam, and perform monthly breast self-exams. Women in this family should also have a gynecological exam as recommended by their primary  provider. All family members should have a colonoscopy by age 41.  FOLLOW-UP: Lastly, we discussed with Robin Castillo that cancer genetics is a rapidly advancing field and it is possible that new genetic tests will be appropriate for her and/or her family members in the future. We encouraged her to remain in contact with cancer genetics on an annual basis so we can update her personal and family histories and let her know of advances in cancer genetics that may benefit this family.   Our contact number was provided. Robin Castillo questions were answered to her satisfaction, and she knows she is welcome to call us at anytime with additional questions or concerns.   Clint Guy, MS, [facility] Genetic Counselor [email or web address] Phone: [phone]

## 2019-08-10 NOTE — Telephone Encounter (Signed)
Revealed negative genetic testing.  Discussed that we do not know why she has breast cancer or why there is cancer in the family.  It could be due to a different gene that we are not testing, or our current technology may not be able detect something.  It will be a good idea for her to keep in contact with genetics to keep up with whether additional testing may be appropriate in the future.   Genetic testing did detect a variant of uncertain significance in the MSH3 gene called c.803G>A (p.Arg268Gln). Her result is still considered normal and should not impact her medical management.

## 2019-08-13 NOTE — Progress Notes (Signed)
[place], [place] [address] Phone: [phone] Fax: [phone]      Your procedure is scheduled on Friday Aug 17, 2019.  Report to [facility] Main Entrance "A" at 05:30 A.M., and check in at the Admitting office.  Call this number if you have problems the morning of surgery:  [phone]  Call [phone] if you have any questions prior to your surgery date Monday-Friday 8am-4pm    Remember:  Do not eat or drink after midnight the night before your surgery  You may drink clear liquids until 04:30 A.M the morning of your surgery.   Clear liquids allowed are: Water, Non-Citrus Juices (without pulp), Carbonated Beverages, Clear Tea, Black Coffee Only, and Gatorade   Please complete your PRE-SURGERY ENSURE that was provided to you by 04:30 A.M the morning of surgery.  Please, if able, drink it in one setting. DO NOT SIP.   7 days prior to surgery STOP taking any Aspirin (unless otherwise instructed by your surgeon), Aleve, Naproxen, Ibuprofen, Motrin, Advil, Goody's, BC's, all herbal medications, fish oil, and all vitamins.    The Morning of Surgery  Do not wear jewelry, make-up or nail polish.  Do not wear lotions, powders, perfumes, or deodorant  Do not shave 48 hours prior to surgery.    Do not bring valuables to the hospital.  [facility] is not responsible for any belongings or valuables.  If you are a smoker, DO NOT Smoke 24 hours prior to surgery  If you wear a CPAP at night please bring your mask the morning of surgery   Remember that you must have someone to transport you home after your surgery, and remain with you for 24 hours if you are discharged the same day.   Please bring cases for contacts, glasses, hearing aids, dentures or bridgework because it cannot be worn into surgery.    Leave your suitcase in the car.  After surgery it may be brought to your room.  For patients  admitted to the hospital, discharge time will be determined by your treatment team.  Patients discharged the day of surgery will not be allowed to drive home.    Special instructions:   [place]- Preparing For Surgery  Before surgery, you can play an important role. Because skin is not sterile, your skin needs to be as free of germs as possible. You can reduce the number of germs on your skin by washing with CHG (chlorahexidine gluconate) Soap before surgery.  CHG is an antiseptic cleaner which kills germs and bonds with the skin to continue killing germs even after washing.    Oral Hygiene is also important to reduce your risk of infection.  Remember - BRUSH YOUR TEETH THE MORNING OF SURGERY WITH YOUR REGULAR TOOTHPASTE  Please do not use if you have an allergy to CHG or antibacterial soaps. If your skin becomes reddened/irritated stop using the CHG.  Do not shave (including legs and underarms) for at least 48 hours prior to first CHG shower. It is OK to shave your face.  Please follow these instructions carefully.   1. Shower the NIGHT BEFORE SURGERY and the MORNING OF SURGERY with CHG Soap.   2. If you chose to wash your hair, wash your hair first as usual with your normal shampoo.  3. After you shampoo, rinse your hair and body thoroughly to remove the shampoo.  4. Use CHG as you  would any other liquid soap. You can apply CHG directly to the skin and wash gently with a scrungie or a clean washcloth.   5. Apply the CHG Soap to your body ONLY FROM THE NECK DOWN.  Do not use on open wounds or open sores. Avoid contact with your eyes, ears, mouth and genitals (private parts). Wash Face and genitals (private parts)  with your normal soap.   6. Wash thoroughly, paying special attention to the area where your surgery will be performed.  7. Thoroughly rinse your body with warm water from the neck down.  8. DO NOT shower/wash with your normal soap after using and rinsing off the CHG  Soap.  9. Pat yourself dry with a CLEAN TOWEL.  10. Wear CLEAN PAJAMAS to bed the night before surgery, wear comfortable clothes the morning of surgery  11. Place CLEAN SHEETS on your bed the night of your first shower and DO NOT SLEEP WITH PETS.    Day of Surgery:  Please shower the morning of surgery with the CHG soap Do not apply any deodorants/lotions. Please wear clean clothes to the hospital/surgery center.   Remember to brush your teeth WITH YOUR REGULAR TOOTHPASTE.   Please read over the following fact sheets that you were given.

## 2019-08-13 NOTE — Progress Notes (Signed)
[place], [place] [address] Phone: [phone] Fax: [phone]    Your procedure is scheduled on Friday Feb 19th.  Report to [facility] Main Entrance "A" at 05:30 A.M., and check in at the Admitting office.  Call this number if you have problems the morning of surgery:  [phone]  Call [phone] if you have any questions prior to your surgery date Monday-Friday 8am-4pm   Remember:  Do not eat after midnight the night before your surgery  Take these medicines the morning of surgery with A SIP OF WATER: NONE  You may drink clear liquids until 04:30 A.M the morning of your surgery.   Clear liquids allowed are: Water, Non-Citrus Juices (without pulp), Carbonated Beverages, Clear Tea, Black Coffee Only, and Gatorade   Please complete your PRE-SURGERY ENSURE that was provided to you by 04:30 A.M the morning of surgery.  Please, if able, drink it in one setting. DO NOT SIP.  As of today, STOP taking any Aspirin (unless otherwise instructed by your surgeon), Aleve, Naproxen, Ibuprofen, Motrin, Advil, Goody's, BC's, all herbal medications, fish oil, and all vitamins.   The Morning of Surgery  Do not wear jewelry, make-up or nail polish.  Do not wear lotions, powders, perfumes, or deodorant  Do not shave 48 hours prior to surgery.    Do not bring valuables to the hospital.  [facility] is not responsible for any belongings or valuables.  If you are a smoker, DO NOT Smoke 24 hours prior to surgery  If you wear a CPAP at night please bring your mask the morning of surgery   Remember that you must have someone to transport you home after your surgery, and remain with you for 24 hours if you are discharged the same day.  Please bring cases for contacts, glasses, hearing aids, dentures or bridgework because it cannot be worn into surgery.   Leave your suitcase in the car.  After surgery it may be brought to  your room.  For patients admitted to the hospital, discharge time will be determined by your treatment team.  Patients discharged the day of surgery will not be allowed to drive home.   Special instructions:   [place]- Preparing For Surgery  Before surgery, you can play an important role. Because skin is not sterile, your skin needs to be as free of germs as possible. You can reduce the number of germs on your skin by washing with CHG (chlorahexidine gluconate) Soap before surgery.  CHG is an antiseptic cleaner which kills germs and bonds with the skin to continue killing germs even after washing.    Oral Hygiene is also important to reduce your risk of infection.  Remember - BRUSH YOUR TEETH THE MORNING OF SURGERY WITH YOUR REGULAR TOOTHPASTE  Please do not use if you have an allergy to CHG or antibacterial soaps. If your skin becomes reddened/irritated stop using the CHG.  Do not shave (including legs and underarms) for at least 48 hours prior to first CHG shower. It is OK to shave your face.  Please follow these instructions carefully.   1. Shower the NIGHT BEFORE SURGERY and the MORNING OF SURGERY with CHG Soap.   2. If you chose to wash your hair, wash your hair first as usual with your normal shampoo.  3. After you shampoo, rinse your hair and body thoroughly to remove the shampoo.  4. Use CHG as you  would any other liquid soap. You can apply CHG directly to the skin and wash gently with a scrungie or a clean washcloth.   5. Apply the CHG Soap to your body ONLY FROM THE NECK DOWN.  Do not use on open wounds or open sores. Avoid contact with your eyes, ears, mouth and genitals (private parts). Wash Face and genitals (private parts)  with your normal soap.   6. Wash thoroughly, paying special attention to the area where your surgery will be performed.  7. Thoroughly rinse your body with warm water from the neck down.  8. DO NOT shower/wash with your normal soap after using and  rinsing off the CHG Soap.  9. Pat yourself dry with a CLEAN TOWEL.  10. Wear CLEAN PAJAMAS to bed the night before surgery, wear comfortable clothes the morning of surgery  11. Place CLEAN SHEETS on your bed the night of your first shower and DO NOT SLEEP WITH PETS.  Day of Surgery: Please shower the morning of surgery with the CHG soap Do not apply any deodorants/lotions. Please wear clean clothes to the hospital/surgery center.   Remember to brush your teeth WITH YOUR REGULAR TOOTHPASTE.  Please read over the following fact sheets that you were given.

## 2019-08-14 ENCOUNTER — Other Ambulatory Visit: Payer: Self-pay

## 2019-08-14 ENCOUNTER — Other Ambulatory Visit (HOSPITAL_COMMUNITY)
Admission: RE | Admit: 2019-08-14 | Discharge: 2019-08-14 | Disposition: A | Discharge status: Home / Self Care | Payer: BC Managed Care – PPO | Source: Ambulatory Visit | Attending: General Surgery | Admitting: General Surgery

## 2019-08-14 ENCOUNTER — Encounter (HOSPITAL_COMMUNITY)
Admission: RE | Admit: 2019-08-14 | Discharge: 2019-08-14 | Disposition: A | Discharge status: Home / Self Care | Payer: BC Managed Care – PPO | Source: Ambulatory Visit | Attending: General Surgery | Admitting: General Surgery

## 2019-08-14 ENCOUNTER — Encounter (HOSPITAL_COMMUNITY): Payer: Self-pay

## 2019-08-14 DIAGNOSIS — Z20822 Contact with and (suspected) exposure to covid-19: Secondary | ICD-10-CM | POA: Insufficient documentation

## 2019-08-14 DIAGNOSIS — M1990 Unspecified osteoarthritis, unspecified site: Secondary | ICD-10-CM | POA: Insufficient documentation

## 2019-08-14 DIAGNOSIS — C50912 Malignant neoplasm of unspecified site of left female breast: Secondary | ICD-10-CM | POA: Insufficient documentation

## 2019-08-14 DIAGNOSIS — Z79899 Other long term (current) drug therapy: Secondary | ICD-10-CM | POA: Diagnosis not present

## 2019-08-14 DIAGNOSIS — Z01812 Encounter for preprocedural laboratory examination: Secondary | ICD-10-CM | POA: Insufficient documentation

## 2019-08-14 DIAGNOSIS — I10 Essential (primary) hypertension: Secondary | ICD-10-CM | POA: Insufficient documentation

## 2019-08-14 DIAGNOSIS — Z7901 Long term (current) use of anticoagulants: Secondary | ICD-10-CM | POA: Insufficient documentation

## 2019-08-14 HISTORY — DX: Unspecified osteoarthritis, unspecified site: M19.90

## 2019-08-14 LAB — CBC
HCT: 36.5 % (ref 36.0–46.0)
Hemoglobin: 11.2 g/dL — ABNORMAL LOW (ref 12.0–15.0)
MCH: 26.7 pg (ref 26.0–34.0)
MCHC: 30.7 g/dL (ref 30.0–36.0)
MCV: 86.9 fL (ref 80.0–100.0)
Platelets: 369 10*3/uL (ref 150–400)
RBC: 4.2 MIL/uL (ref 3.87–5.11)
RDW: 12.8 % (ref 11.5–15.5)
WBC: 3.9 10*3/uL — ABNORMAL LOW (ref 4.0–10.5)
nRBC: 0 % (ref 0.0–0.2)

## 2019-08-14 LAB — BASIC METABOLIC PANEL
Anion gap: 8 (ref 5–15)
BUN: <5 mg/dL — ABNORMAL LOW (ref 6–20)
CO2: 26 mmol/L (ref 22–32)
Calcium: 9.2 mg/dL (ref 8.9–10.3)
Chloride: 107 mmol/L (ref 98–111)
Creatinine, Ser: 0.83 mg/dL (ref 0.44–1.00)
GFR calc Af Amer: >60 mL/min (ref >60)
GFR calc non Af Amer: >60 mL/min (ref >60)
Glucose, Bld: 93 mg/dL (ref 70–99)
Potassium: 3.7 mmol/L (ref 3.5–5.1)
Sodium: 141 mmol/L (ref 135–145)

## 2019-08-14 LAB — POCT PREGNANCY, URINE: Preg Test, Ur: NEGATIVE

## 2019-08-14 LAB — SARS CORONAVIRUS 2 (TAT 6-24 HRS): SARS Coronavirus 2: NEGATIVE

## 2019-08-14 NOTE — Progress Notes (Addendum)
PCP - Dr. [name] Cardiologist - denies  PPM/ICD - denies  Chest x-ray - N/A EKG - per patient 05/17/2019 - tracing requested from PCP's office by patient via telephone, PAT fax number given  Stress Test - per patient more than 5 years ago, patient stated wanted a work up done because of HTN, no abnormal findings  ECHO - denies Cardiac Cath - denies  Sleep Study - denies CPAP - N/A  DM: denies  Blood Thinner Instructions: denies Aspirin Instructions: N/A  ERAS Protcol - Yes PRE-SURGERY Ensure or G2- Ensure given  COVID TEST- Scheduled for today after PAT appointment. Patient verbalized understanding of self-quarantine instructions, appointment time and place.  Anesthesia review: YES, radioactive seed implantation for breast surgery Seed Placement: Scheduled for Thursday, 08/16/2019 per patient  Patient denies shortness of breath, fever, cough and chest pain at PAT appointment  All instructions explained to the patient, with a verbal understanding of the material. Patient agrees to go over the instructions while at home for a better understanding. Patient also instructed to self quarantine after being tested for COVID-19. The opportunity to ask questions was provided.

## 2019-08-15 NOTE — Anesthesia Preprocedure Evaluation (Addendum)
Anesthesia Evaluation  Patient identified by MRN, date of birth, ID band Patient awake    Reviewed: Allergy & Precautions, NPO status , Patient's Chart, lab work & pertinent test results  Airway Mallampati: II  TM Distance: >3 FB Neck ROM: Full    Dental  (+) Teeth Intact, Dental Advisory Given   Pulmonary neg pulmonary ROS,    Pulmonary exam normal breath sounds clear to auscultation       Cardiovascular hypertension, Normal cardiovascular exam Rhythm:Regular Rate:Normal     Neuro/Psych negative neurological ROS     GI/Hepatic negative GI ROS, Neg liver ROS,   Endo/Other  negative endocrine ROS  Renal/GU negative Renal ROS     Musculoskeletal  (+) Arthritis ,   Abdominal   Peds  Hematology negative hematology ROS (+)   Anesthesia Other Findings Day of surgery medications reviewed with the patient.  left breast cancer  Reproductive/Obstetrics                             Anesthesia Physical Anesthesia Plan  ASA: II  Anesthesia Plan: General   Post-op Pain Management:  Regional for Post-op pain   Induction: Intravenous  PONV Risk Score and Plan: 3 and Midazolam, Dexamethasone and Ondansetron  Airway Management Planned: LMA  Additional Equipment:   Intra-op Plan:   Post-operative Plan: Extubation in OR  Informed Consent: I have reviewed the patients History and Physical, chart, labs and discussed the procedure including the risks, benefits and alternatives for the proposed anesthesia with the patient or authorized representative who has indicated his/her understanding and acceptance.     Dental advisory given  Plan Discussed with: CRNA  Anesthesia Plan Comments: (PAT note written 08/15/2019 by [name], PA-C. )       Anesthesia Quick Evaluation

## 2019-08-15 NOTE — Progress Notes (Addendum)
Anesthesia Chart Review:  Case: [id number] Date/Time: 08/17/19 0715   Procedure: LEFT BREAST LUMPECTOMY WITH RADIOACTIVE SEED AND LEFT AXILLARY SENTINEL LYMPH NODE BIOPSY (Left Breast)   Anesthesia type: General   Pre-op diagnosis: left breast cancer   Location: [facility] OR ROOM 01 / [place] OR   Surgeons: [name], MD    Seed implant is scheduled for 08/16/19.   DISCUSSION: Patient is a 49 year old female scheduled for the above procedure.  History includes never smoker, HTN, pregnancy induced HTN, PCOS, breast augmentation, left breast cancer (06/2019).  Preoperative labs acceptable with negative urine pregnancy test (done at PAT due to need for result prior to RSL implant). 08/14/19 presurgical COVID-19 test negative. Anesthesia team to evaluate on the day of surgery.   UPDATE 08/20/19 5:35 PM: Patient's surgery rescheduled due to inclement weather. Surgery and RSL moved to 08/21/19. Repeat urine pregnancy test on 08/20/19 was negative. Repeat COVID-19 on 08/17/19 was also negative.   VS: BP 137/81   Pulse 77   Temp 36.8 C (Oral)   Ht 5' 4.3" (1.633 m)   Wt 65.5 kg   LMP 08/14/2019   SpO2 100%   BMI 24.57 kg/m     PROVIDERS: [name], MD is PCP  Magrinat, Sarajane Jews, MD is HEM-ONC Kyung Rudd, MD is RAD-ONC   LABS: Labs reviewed: Acceptable for surgery. (all labs ordered are listed, but only abnormal results are displayed)  Labs Reviewed  BASIC METABOLIC PANEL - Abnormal; Notable for the following components:      Result Value   BUN <5 (*)    All other components within normal limits  CBC - Abnormal; Notable for the following components:   WBC 3.9 (*)    Hemoglobin 11.2 (*)    All other components within normal limits  POCT PREGNANCY, URINE    EKG: 05/16/19 (Dr. Mellody Drown): NSR. Artifact in V1.   CV: Reported a normal stress test > 5 years ago.   Past Medical History:  Diagnosis Date  . Arthritis    per patient new onset in finger joints  . BV (bacterial vaginosis)  2012  . Cancer ([place]) 2021   left breast   . Cyst of ovary, right 2003  . Family history of prostate cancer in father   . GBS carrier   . H/O rubella   . H/O varicella   . History of PCOS 06/2005  . Hypertension   . Infertility, female   . Irregular menses 11/2001  . Leukorrhea 04/2007   phsiological  . Pregnancy induced hypertension   . Psychosocial stressors 10/2009    Past Surgical History:  Procedure Laterality Date  . BREAST ENHANCEMENT SURGERY    . CESAREAN SECTION      MEDICATIONS: . diclofenac (VOLTAREN) 75 MG EC tablet  . lisinopril-hydrochlorothiazide (PRINZIDE,ZESTORETIC) 10-12.5 MG per tablet   No current facility-administered medications for this encounter.    [name], PA-C Surgical Short Stay/Anesthesiology [facility] Phone [phone] [facility] Phone [phone] 08/15/2019 9:14 AM

## 2019-08-16 ENCOUNTER — Inpatient Hospital Stay: Admission: RE | Admit: 2019-08-16 | Payer: BC Managed Care – PPO | Source: Ambulatory Visit

## 2019-08-17 ENCOUNTER — Other Ambulatory Visit (HOSPITAL_COMMUNITY)
Admission: RE | Admit: 2019-08-17 | Discharge: 2019-08-17 | Disposition: A | Discharge status: Home / Self Care | Payer: BC Managed Care – PPO | Source: Ambulatory Visit | Attending: General Surgery | Admitting: General Surgery

## 2019-08-17 ENCOUNTER — Encounter (HOSPITAL_COMMUNITY): Payer: BC Managed Care – PPO

## 2019-08-17 DIAGNOSIS — Z01812 Encounter for preprocedural laboratory examination: Secondary | ICD-10-CM | POA: Insufficient documentation

## 2019-08-17 DIAGNOSIS — Z20822 Contact with and (suspected) exposure to covid-19: Secondary | ICD-10-CM | POA: Diagnosis not present

## 2019-08-17 LAB — SARS CORONAVIRUS 2 (TAT 6-24 HRS): SARS Coronavirus 2: NEGATIVE

## 2019-08-20 ENCOUNTER — Encounter (HOSPITAL_COMMUNITY)
Admission: RE | Admit: 2019-08-20 | Discharge: 2019-08-20 | Disposition: A | Discharge status: Home / Self Care | Payer: BC Managed Care – PPO | Source: Ambulatory Visit | Attending: General Surgery | Admitting: General Surgery

## 2019-08-20 ENCOUNTER — Other Ambulatory Visit: Payer: Self-pay

## 2019-08-20 DIAGNOSIS — Z01812 Encounter for preprocedural laboratory examination: Secondary | ICD-10-CM | POA: Diagnosis present

## 2019-08-20 LAB — POCT PREGNANCY, URINE: Preg Test, Ur: NEGATIVE

## 2019-08-20 NOTE — Progress Notes (Signed)
Pt rescheduled for seed placement tomorrow at 0730. Pt stated that she will come in for repeat Urine pregnancy test as advised by PA, Anesthesiology today.

## 2019-08-21 ENCOUNTER — Ambulatory Visit
Admission: RE | Admit: 2019-08-21 | Discharge: 2019-08-21 | Disposition: A | Discharge status: Home / Self Care | Payer: BC Managed Care – PPO | Source: Ambulatory Visit | Attending: General Surgery | Admitting: General Surgery

## 2019-08-21 ENCOUNTER — Ambulatory Visit (HOSPITAL_BASED_OUTPATIENT_CLINIC_OR_DEPARTMENT_OTHER): Payer: BC Managed Care – PPO | Admitting: Vascular Surgery

## 2019-08-21 ENCOUNTER — Encounter (HOSPITAL_COMMUNITY)
Admission: RE | Admit: 2019-08-21 | Discharge: 2019-08-21 | Disposition: A | Discharge status: Home / Self Care | Payer: BC Managed Care – PPO | Source: Ambulatory Visit | Attending: General Surgery | Admitting: General Surgery

## 2019-08-21 ENCOUNTER — Encounter (HOSPITAL_BASED_OUTPATIENT_CLINIC_OR_DEPARTMENT_OTHER)
Admission: RE | Disposition: A | Discharge status: Home / Self Care | Payer: Self-pay | Source: Ambulatory Visit | Attending: General Surgery

## 2019-08-21 ENCOUNTER — Ambulatory Visit (HOSPITAL_COMMUNITY)
Admission: RE | Admit: 2019-08-21 | Discharge: 2019-08-21 | Disposition: A | Discharge status: Home / Self Care | Payer: BC Managed Care – PPO | Source: Ambulatory Visit | Attending: General Surgery | Admitting: General Surgery

## 2019-08-21 ENCOUNTER — Encounter (HOSPITAL_BASED_OUTPATIENT_CLINIC_OR_DEPARTMENT_OTHER): Payer: Self-pay | Admitting: General Surgery

## 2019-08-21 ENCOUNTER — Other Ambulatory Visit: Payer: Self-pay

## 2019-08-21 DIAGNOSIS — C50412 Malignant neoplasm of upper-outer quadrant of left female breast: Secondary | ICD-10-CM

## 2019-08-21 DIAGNOSIS — Z170 Estrogen receptor positive status [ER+]: Secondary | ICD-10-CM

## 2019-08-21 HISTORY — PX: BREAST LUMPECTOMY WITH RADIOACTIVE SEED AND SENTINEL LYMPH NODE BIOPSY: SHX6550

## 2019-08-21 LAB — POCT PREGNANCY, URINE: Preg Test, Ur: NEGATIVE

## 2019-08-21 SURGERY — BREAST LUMPECTOMY WITH RADIOACTIVE SEED AND SENTINEL LYMPH NODE BIOPSY
Anesthesia: General | Site: Breast | Laterality: Left

## 2019-08-21 MED ORDER — CEFAZOLIN SODIUM-DEXTROSE 2-4 GM/100ML-% IV SOLN
2.0000 g | INTRAVENOUS | Status: AC
  Administered 2019-08-21: 2 g via INTRAVENOUS

## 2019-08-21 MED ORDER — OXYCODONE HCL 5 MG PO TABS: 5.0000 mg | ORAL_TABLET | Freq: Once | ORAL | Status: DC | PRN

## 2019-08-21 MED ORDER — GABAPENTIN 100 MG PO CAPS
ORAL_CAPSULE | ORAL | Status: AC
  Filled 2019-08-21: qty 1

## 2019-08-21 MED ORDER — FENTANYL CITRATE (PF) 100 MCG/2ML IJ SOLN
INTRAMUSCULAR | Status: DC | PRN
  Administered 2019-08-21 (×2): 50 ug via INTRAVENOUS

## 2019-08-21 MED ORDER — MIDAZOLAM HCL 2 MG/2ML IJ SOLN
INTRAMUSCULAR | Status: AC
  Filled 2019-08-21: qty 2

## 2019-08-21 MED ORDER — MIDAZOLAM HCL 5 MG/5ML IJ SOLN
INTRAMUSCULAR | Status: DC | PRN
  Administered 2019-08-21: 1 mg via INTRAVENOUS

## 2019-08-21 MED ORDER — PHENYLEPHRINE 40 MCG/ML (10ML) SYRINGE FOR IV PUSH (FOR BLOOD PRESSURE SUPPORT)
PREFILLED_SYRINGE | INTRAVENOUS | Status: AC
  Filled 2019-08-21: qty 10

## 2019-08-21 MED ORDER — FENTANYL CITRATE (PF) 100 MCG/2ML IJ SOLN: 25.0000 ug | INTRAMUSCULAR | Status: DC | PRN

## 2019-08-21 MED ORDER — BUPIVACAINE HCL (PF) 0.25 % IJ SOLN
INTRAMUSCULAR | Status: DC | PRN
  Administered 2019-08-21: 4 mL

## 2019-08-21 MED ORDER — PROPOFOL 10 MG/ML IV BOLUS
INTRAVENOUS | Status: DC | PRN
  Administered 2019-08-21: 150 mg via INTRAVENOUS

## 2019-08-21 MED ORDER — ACETAMINOPHEN 500 MG PO TABS
1000.0000 mg | ORAL_TABLET | ORAL | Status: AC
  Administered 2019-08-21: 14:00:00 1000 mg via ORAL

## 2019-08-21 MED ORDER — OXYCODONE HCL 5 MG/5ML PO SOLN: 5.0000 mg | Freq: Once | ORAL | Status: DC | PRN

## 2019-08-21 MED ORDER — OXYCODONE HCL 5 MG PO TABS: 5.0000 mg | ORAL_TABLET | Freq: Four times a day (QID) | ORAL | 0 refills | Status: DC | PRN

## 2019-08-21 MED ORDER — ACETAMINOPHEN 500 MG PO TABS
ORAL_TABLET | ORAL | Status: AC
  Filled 2019-08-21: qty 2

## 2019-08-21 MED ORDER — LACTATED RINGERS IV SOLN: INTRAVENOUS | Status: DC | PRN

## 2019-08-21 MED ORDER — ONDANSETRON HCL 4 MG/2ML IJ SOLN
INTRAMUSCULAR | Status: AC
  Filled 2019-08-21: qty 2

## 2019-08-21 MED ORDER — ENSURE PRE-SURGERY PO LIQD: 296.0000 mL | Freq: Once | ORAL | Status: DC

## 2019-08-21 MED ORDER — TECHNETIUM TC 99M SULFUR COLLOID FILTERED
1.0000 | Freq: Once | INTRAVENOUS | Status: AC | PRN
  Administered 2019-08-21: 1 via INTRADERMAL

## 2019-08-21 MED ORDER — LIDOCAINE 2% (20 MG/ML) 5 ML SYRINGE
INTRAMUSCULAR | Status: AC
  Filled 2019-08-21: qty 5

## 2019-08-21 MED ORDER — GABAPENTIN 100 MG PO CAPS
100.0000 mg | ORAL_CAPSULE | ORAL | Status: AC
  Administered 2019-08-21: 14:00:00 100 mg via ORAL

## 2019-08-21 MED ORDER — CEFAZOLIN SODIUM-DEXTROSE 2-4 GM/100ML-% IV SOLN
INTRAVENOUS | Status: AC
  Filled 2019-08-21: qty 100

## 2019-08-21 MED ORDER — ONDANSETRON HCL 4 MG/2ML IJ SOLN
INTRAMUSCULAR | Status: DC | PRN
  Administered 2019-08-21: 4 mg via INTRAVENOUS

## 2019-08-21 MED ORDER — FENTANYL CITRATE (PF) 100 MCG/2ML IJ SOLN
INTRAMUSCULAR | Status: AC
  Filled 2019-08-21: qty 2

## 2019-08-21 MED ORDER — LIDOCAINE 2% (20 MG/ML) 5 ML SYRINGE
INTRAMUSCULAR | Status: DC | PRN
  Administered 2019-08-21: 60 mg via INTRAVENOUS

## 2019-08-21 MED ORDER — BUPIVACAINE-EPINEPHRINE (PF) 0.5% -1:200000 IJ SOLN
INTRAMUSCULAR | Status: DC | PRN
  Administered 2019-08-21: 30 mL via PERINEURAL

## 2019-08-21 MED ORDER — CLONIDINE HCL (ANALGESIA) 100 MCG/ML EP SOLN
EPIDURAL | Status: DC | PRN
  Administered 2019-08-21: 100 ug

## 2019-08-21 MED ORDER — SUCCINYLCHOLINE CHLORIDE 200 MG/10ML IV SOSY
PREFILLED_SYRINGE | INTRAVENOUS | Status: AC
  Filled 2019-08-21: qty 20

## 2019-08-21 MED ORDER — PROMETHAZINE HCL 25 MG/ML IJ SOLN: 6.2500 mg | INTRAMUSCULAR | Status: DC | PRN

## 2019-08-21 MED ORDER — MIDAZOLAM HCL 2 MG/2ML IJ SOLN
1.0000 mg | INTRAMUSCULAR | Status: DC | PRN
  Administered 2019-08-21: 14:00:00 2 mg via INTRAVENOUS

## 2019-08-21 MED ORDER — FENTANYL CITRATE (PF) 100 MCG/2ML IJ SOLN
50.0000 ug | INTRAMUSCULAR | Status: DC | PRN
  Administered 2019-08-21: 14:00:00 50 ug via INTRAVENOUS

## 2019-08-21 MED ORDER — DEXAMETHASONE SODIUM PHOSPHATE 10 MG/ML IJ SOLN
INTRAMUSCULAR | Status: DC | PRN
  Administered 2019-08-21: 10 mg via INTRAVENOUS

## 2019-08-21 MED ORDER — DEXAMETHASONE SODIUM PHOSPHATE 10 MG/ML IJ SOLN
INTRAMUSCULAR | Status: AC
  Filled 2019-08-21: qty 1

## 2019-08-21 MED ORDER — EPHEDRINE 5 MG/ML INJ
INTRAVENOUS | Status: AC
  Filled 2019-08-21: qty 10

## 2019-08-21 SURGICAL SUPPLY — 65 items
ADH SKN CLS APL DERMABOND .7 (GAUZE/BANDAGES/DRESSINGS) ×1
APL PRP STRL LF DISP 70% ISPRP (MISCELLANEOUS) ×1
APPLIER CLIP 9.375 MED OPEN (MISCELLANEOUS) ×3
APR CLP MED 9.3 20 MLT OPN (MISCELLANEOUS) ×1
BINDER BREAST LRG (GAUZE/BANDAGES/DRESSINGS) IMPLANT
BINDER BREAST MEDIUM (GAUZE/BANDAGES/DRESSINGS) IMPLANT
BINDER BREAST XLRG (GAUZE/BANDAGES/DRESSINGS) IMPLANT
BINDER BREAST XXLRG (GAUZE/BANDAGES/DRESSINGS) IMPLANT
BLADE SURG 15 STRL LF DISP TIS (BLADE) ×1 IMPLANT
BLADE SURG 15 STRL SS (BLADE) ×3
CANISTER SUC SOCK COL 7IN (MISCELLANEOUS) IMPLANT
CANISTER SUCT 1200ML W/VALVE (MISCELLANEOUS) IMPLANT
CHLORAPREP W/TINT 26 (MISCELLANEOUS) ×3 IMPLANT
CLIP APPLIE 9.375 MED OPEN (MISCELLANEOUS) IMPLANT
CLIP VESOCCLUDE SM WIDE 6/CT (CLIP) ×1 IMPLANT
CLOSURE WOUND 1/2 X4 (GAUZE/BANDAGES/DRESSINGS) ×1
COVER BACK TABLE 60X90IN (DRAPES) ×3 IMPLANT
COVER MAYO STAND STRL (DRAPES) ×3 IMPLANT
COVER PROBE W GEL 5X96 (DRAPES) ×3 IMPLANT
COVER WAND RF STERILE (DRAPES) IMPLANT
DECANTER SPIKE VIAL GLASS SM (MISCELLANEOUS) IMPLANT
DERMABOND ADVANCED (GAUZE/BANDAGES/DRESSINGS) ×2
DERMABOND ADVANCED .7 DNX12 (GAUZE/BANDAGES/DRESSINGS) ×1 IMPLANT
DRAPE LAPAROSCOPIC ABDOMINAL (DRAPES) ×3 IMPLANT
DRAPE UTILITY XL STRL (DRAPES) ×3 IMPLANT
ELECT COATED BLADE 2.86 ST (ELECTRODE) ×3 IMPLANT
ELECT REM PT RETURN 9FT ADLT (ELECTROSURGICAL) ×3
ELECTRODE REM PT RTRN 9FT ADLT (ELECTROSURGICAL) ×1 IMPLANT
GLOVE BIO SURGEON STRL SZ 6.5 (GLOVE) ×1 IMPLANT
GLOVE BIO SURGEON STRL SZ7 (GLOVE) ×6 IMPLANT
GLOVE BIO SURGEONS STRL SZ 6.5 (GLOVE) ×1
GLOVE BIOGEL PI IND STRL 7.0 (GLOVE) IMPLANT
GLOVE BIOGEL PI IND STRL 7.5 (GLOVE) ×1 IMPLANT
GLOVE BIOGEL PI INDICATOR 7.0 (GLOVE) ×4
GLOVE BIOGEL PI INDICATOR 7.5 (GLOVE) ×2
GOWN STRL REUS W/ TWL LRG LVL3 (GOWN DISPOSABLE) ×2 IMPLANT
GOWN STRL REUS W/TWL LRG LVL3 (GOWN DISPOSABLE) ×9
HEMOSTAT ARISTA ABSORB 3G PWDR (HEMOSTASIS) IMPLANT
KIT MARKER MARGIN INK (KITS) ×3 IMPLANT
NDL HYPO 25X1 1.5 SAFETY (NEEDLE) ×1 IMPLANT
NDL SAFETY ECLIPSE 18X1.5 (NEEDLE) IMPLANT
NEEDLE HYPO 18GX1.5 SHARP (NEEDLE)
NEEDLE HYPO 25X1 1.5 SAFETY (NEEDLE) ×3 IMPLANT
NS IRRIG 1000ML POUR BTL (IV SOLUTION) ×2 IMPLANT
PACK BASIN DAY SURGERY FS (CUSTOM PROCEDURE TRAY) ×3 IMPLANT
PENCIL SMOKE EVACUATOR (MISCELLANEOUS) ×3 IMPLANT
RETRACTOR ONETRAX LX 90X20 (MISCELLANEOUS) ×2 IMPLANT
SLEEVE SCD COMPRESS KNEE MED (MISCELLANEOUS) ×3 IMPLANT
SPONGE LAP 4X18 RFD (DISPOSABLE) ×3 IMPLANT
STRIP CLOSURE SKIN 1/2X4 (GAUZE/BANDAGES/DRESSINGS) ×2 IMPLANT
SUT ETHILON 2 0 FS 18 (SUTURE) IMPLANT
SUT MNCRL AB 4-0 PS2 18 (SUTURE) ×3 IMPLANT
SUT MON AB 5-0 PS2 18 (SUTURE) ×2 IMPLANT
SUT SILK 2 0 SH (SUTURE) ×4 IMPLANT
SUT VIC AB 2-0 SH 27 (SUTURE) ×6
SUT VIC AB 2-0 SH 27XBRD (SUTURE) ×1 IMPLANT
SUT VIC AB 3-0 SH 27 (SUTURE) ×6
SUT VIC AB 3-0 SH 27X BRD (SUTURE) ×1 IMPLANT
SUT VIC AB 5-0 PS2 18 (SUTURE) IMPLANT
SYR CONTROL 10ML LL (SYRINGE) ×3 IMPLANT
TOWEL GREEN STERILE FF (TOWEL DISPOSABLE) ×3 IMPLANT
TRAY FAXITRON CT DISP (TRAY / TRAY PROCEDURE) ×3 IMPLANT
TUBE CONNECTING 20'X1/4 (TUBING)
TUBE CONNECTING 20X1/4 (TUBING) IMPLANT
YANKAUER SUCT BULB TIP NO VENT (SUCTIONS) IMPLANT

## 2019-08-21 NOTE — Op Note (Addendum)
Preoperative diagnosis:clinical stage I left breast cancer Postoperative diagnosis: Same as above Procedure:left breastseedguidedlumpectomy leftdeep axillary sentinel node biopsy Surgeon: Dr. Serita Grammes Anesthesia: General  Specimens: 1.Left breast tissue marked with paint, containing seedand clip 3. Additional inferior, lateral, superior, and posterior margins marked short superior, long lateral double deep 4. Leftdeep axillary nodeswith highest count [id number] Estimated blood [id number] cc Complications: None Drains: None Sponge and count was correct at completion Disposition to recovery in stable condition  Indications: 77 yof referred by Dr [name] for new left breast cancer. she noted a left breast mass on her exam. She underwent mm that showed 1.2 cm mass with calcs associated with it. she had [place] that shows a 9x8x7 mm mass and axilla was negative. 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 Genetics was negative. She elected to proceed with seed guided lumpectomy and sn biopsy  Procedure: After informed consent was obtained shewas placed under general anesthesia without complication.She had undergone a pectoral block and injected with technetium in standard periareolar fashion.She was given antibiotics. SCDs were in place. She was then prepped and draped in the standard sterile surgical fashion. A surgical timeout was then performed.  I then identified the seedin thelateral left breast. Iinfiltrated Marcaine. I made a periareolar incision in order to hide the scar later.I then tunneled to the seed using the neoprobe for guidance. I then removed the seed and the surrounding tissue with an attempt to get a clear margin.   Mammogram confirmed removal of seed and clip. After viewing the 3D images I removed the additional margins as above. The posterior margin is now the muscle. The implant was not disturbed.  I  then obtained hemostasis.  I then placed clips in the cavity. I closed the breast tissue with 2-0 vicryl, 3-0 vicryl and 5-0 monocryl. I then placed glue and steristrips.  I then located the sentinel nodesin the low axilla. I made an incision at the axillary hairline after infiltrating marcaine.  Ienteredthe axillary fascia. I then identified the sentinel node with a count [id number]. I excised what appeared to be several nodes. The background radioactivity was less than 10% and there were no palpable nodes present.I then obtained hemostasis. I closed the fascia with 2-0 vicryl. I closed this with 2-0 vicryl 3-0 vicryl and 4-0 monocryl. She had a binder placed. She was extubated and transferred to recovery stable

## 2019-08-21 NOTE — H&P (Signed)
49 yof referred by Dr [name] for new left breast cancer. she noted a left breast mass on her exam. She underwent mm that showed 1.2 cm mass with calcs associated with it. she had [place] that shows a 9x8x7 mm mass and axilla was negative. 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 she is an rn. she is here to discuss options today in [facility]   Past Surgical History Tawni Pummel, RN; 08/01/2019 7:30 AM) Cesarean Section - 1   Diagnostic Studies History Tawni Pummel, RN; 08/01/2019 7:30 AM) Colonoscopy  never Mammogram  within last year Pap Smear  1-5 years ago  Medication History Tawni Pummel, RN; 08/01/2019 7:30 AM) Medications Reconciled  Social History Tawni Pummel, RN; 08/01/2019 7:30 AM) Caffeine use  Coffee, Tea. No alcohol use  No drug use  Tobacco use  Never smoker.  Family History Tawni Pummel, RN; 08/01/2019 7:30 AM) Alcohol Abuse  Father. Arthritis  Father. Cerebrovascular Accident  Family Members In General. Depression  Sister. Diabetes Mellitus  Family Members In General, Mother. Heart Disease  Mother. Heart disease in female family member before age 4  Hypertension  Mother. Prostate Cancer  Father.  Pregnancy / Birth History Tawni Pummel, RN; 08/01/2019 7:30 AM) Age at menarche  31 years. Contraceptive History  Oral contraceptives. Gravida  4 Irregular periods  Length (months) of breastfeeding  3-6 Maternal age  48-35 Para  2  Other Problems Tawni Pummel, RN; 08/01/2019 7:30 AM) High blood pressure  Hypercholesterolemia    Review of Systems Sunday Spillers Ledford RN; 08/01/2019 7:30 AM) General Not Present- Appetite Loss, Chills, Fatigue, Fever, Night Sweats, Weight Gain and Weight Loss. Skin Not Present- Change in Wart/Mole, Dryness, Hives, Jaundice, New Lesions, Non-Healing Wounds, Rash and Ulcer. HEENT Not Present- Earache, Hearing Loss, Hoarseness, Nose Bleed, Oral Ulcers,  Ringing in the Ears, Seasonal Allergies, Sinus Pain, Sore Throat, Visual Disturbances, Wears glasses/contact lenses and Yellow Eyes. Respiratory Not Present- Bloody sputum, Chronic Cough, Difficulty Breathing, Snoring and Wheezing. Breast Not Present- Breast Mass, Breast Pain, Nipple Discharge and Skin Changes. Cardiovascular Not Present- Chest Pain, Difficulty Breathing Lying Down, Leg Cramps, Palpitations, Rapid Heart Rate, Shortness of Breath and Swelling of Extremities. Gastrointestinal Not Present- Abdominal Pain, Bloating, Bloody Stool, Change in Bowel Habits, Chronic diarrhea, Constipation, Difficulty Swallowing, Excessive gas, Gets full quickly at meals, Hemorrhoids, Indigestion, Nausea, Rectal Pain and Vomiting. Female Genitourinary Not Present- Frequency, Nocturia, Painful Urination, Pelvic Pain and Urgency. Musculoskeletal Present- Joint Pain and Joint Stiffness. Not Present- Back Pain, Muscle Pain, Muscle Weakness and Swelling of Extremities. Neurological Not Present- Decreased Memory, Fainting, Headaches, Numbness, Seizures, Tingling, Tremor, Trouble walking and Weakness. Psychiatric Not Present- Anxiety, Bipolar, Change in Sleep Pattern, Depression, Fearful and Frequent crying. Endocrine Not Present- Cold Intolerance, Excessive Hunger, Hair Changes, Heat Intolerance, Hot flashes and New Diabetes. Hematology Not Present- Blood Thinners, Easy Bruising, Excessive bleeding, Gland problems, HIV and Persistent Infections.   Physical Exam [name] MD; 08/01/2019 2:29 PM) General Mental Status-Alert. Orientation-Oriented X3. Head and Neck Trachea-midline. Eye Sclera/Conjunctiva - Bilateral-No scleral icterus. Chest and Lung Exam Chest and lung exam reveals -quiet, even and easy respiratory effort with no use of accessory muscles. Breast Nipples-No Discharge. Breast Lump-No Palpable Breast Mass. Neurologic Neurologic evaluation reveals -alert and oriented x 3  with no impairment of recent or remote memory. Lymphatic Head & Neck General Head & Neck Lymphatics: Bilateral - Description - Normal. Axillary General Axillary Region: Bilateral -  Description - Normal. Note: no [place] adenopathy   Assessment & Plan [name] MD; 08/02/2019 10:26 AM) BREAST CANCER OF UPPER-OUTER QUADRANT OF LEFT FEMALE BREAST (C50.412) Story: Left breast seed guided lumpectomy, left ax sn biopsy, genetics testing We discussed the staging and pathophysiology of breast cancer. We discussed all of the different options for treatment for breast cancer including surgery, chemotherapy, radiation therapy, Herceptin, and antiestrogen therapy. We discussed a sentinel lymph node biopsy as she does not appear to having lymph node involvement right now. We discussed the performance of that with injection of radioactive tracer. We discussed that there is a chance of having a positive node with a sentinel lymph node biopsy and we will await the permanent pathology to make any other first further decisions in terms of her treatment. We discussed up to a 5% risk lifetime of chronic shoulder pain as well as lymphedema associated with a sentinel lymph node biopsy. We discussed the options for treatment of the breast cancer which included lumpectomy versus a mastectomy. We discussed the performance of the lumpectomy with radioactive seed placement. We discussed a 5-10% chance of a positive margin requiring reexcision in the operating room. We also discussed that she will need radiation therapy if she undergoes lumpectomy. We discussed mastectomy and the postoperative care for that as well. Mastectomy can be followed by reconstruction. The decision for lumpectomy vs mastectomy has no impact on decision for chemotherapy. Most mastectomy patients will not need radiation therapy. We discussed that there is no difference in her survival whether she undergoes lumpectomy with radiation therapy or  antiestrogen therapy versus a mastectomy. There is also no real difference between her recurrence in the breast. We discussed the risks of operation including bleeding, infection, possible reoperation. She understands her further therapy will be based on what her stages at the time of her operation.

## 2019-08-21 NOTE — Transfer of Care (Signed)
Immediate Anesthesia Transfer of Care Note  Patient: Robin Castillo  Procedure(s) Performed: LEFT BREAST LUMPECTOMY WITH RADIOACTIVE SEED AND LEFT AXILLARY SENTINEL LYMPH NODE BIOPSY (Left Breast)  Patient Location: PACU  Anesthesia Type:GA combined with regional for post-op pain  Level of Consciousness: drowsy and patient cooperative  Airway & Oxygen Therapy: Patient Spontanous Breathing and Patient connected to face mask oxygen  Post-op Assessment: Report given to RN and Post -op Vital signs reviewed and stable  Post vital signs: Reviewed and stable  Last Vitals:  Vitals Value Taken Time  BP 100/66 08/21/19 1550  Temp    Pulse 75 08/21/19 1552  Resp 18 08/21/19 1552  SpO2 100 % 08/21/19 1552  Vitals shown include unvalidated device data.  Last Pain:  Vitals:   08/21/19 1349  TempSrc: Temporal  PainSc: 0-No pain         Complications: No apparent anesthesia complications

## 2019-08-21 NOTE — Anesthesia Procedure Notes (Signed)
Anesthesia Regional Block: Pectoralis block   Pre-Anesthetic Checklist: ,, timeout performed, Correct Patient, Correct Site, Correct Laterality, Correct Procedure, Correct Position, site marked, Risks and benefits discussed,  Surgical consent,  Pre-op evaluation,  At surgeon's request and post-op pain management  Laterality: Left  Prep: chloraprep       Needles:  Injection technique: Single-shot  Needle Type: Echogenic Needle     Needle Length: 9cm  Needle Gauge: 21     Additional Needles:   Procedures:,,,, ultrasound used (permanent image in chart),,,,  Narrative:  Start time: 08/21/2019 2:08 PM End time: 08/21/2019 2:18 PM Injection made incrementally with aspirations every 5 mL.  Performed by: Personally  Anesthesiologist: Catalina Gravel, MD  Additional Notes: No pain on injection. No increased resistance to injection. Injection made in 5cc increments.  Good needle visualization.  Patient tolerated procedure well.

## 2019-08-21 NOTE — Discharge Instructions (Signed)
[place] Office Phone Number [phone]  BREAST BIOPSY/ PARTIAL MASTECTOMY: POST OP INSTRUCTIONS Take 400 mg of ibuprofen every 8 hours or 650 mg tylenol every 6 hours for next 72 hours then as needed. Use ice several times daily also. Always review your discharge instruction sheet given to you by the facility where your surgery was performed.  IF YOU HAVE DISABILITY OR FAMILY LEAVE FORMS, YOU MUST BRING THEM TO THE OFFICE FOR PROCESSING.  DO NOT GIVE THEM TO YOUR DOCTOR.  1. A prescription for pain medication may be given to you upon discharge.  Take your pain medication as prescribed, if needed.  If narcotic pain medicine is not needed, then you may take acetaminophen (Tylenol), naprosyn (Alleve) or ibuprofen (Advil) as needed. No Tylenol until 8:00pm if needed 2. Take your usually prescribed medications unless otherwise directed 3. If you need a refill on your pain medication, please contact your pharmacy.  They will contact our office to request authorization.  Prescriptions will not be filled after 5pm or on week-ends. 4. You should eat very light the first 24 hours after surgery, such as soup, crackers, pudding, etc.  Resume your normal diet the day after surgery. 5. Most patients will experience some swelling and bruising in the breast.  Ice packs and a good support bra will help.  Wear the breast binder provided or a sports bra for 72 hours day and night.  After that wear a sports bra during the day until you return to the office. Swelling and bruising can take several days to resolve.  6. It is common to experience some constipation if taking pain medication after surgery.  Increasing fluid intake and taking a stool softener will usually help or prevent this problem from occurring.  A mild laxative (Milk of Magnesia or Miralax) should be taken according to package directions if there are no bowel movements after 48 hours. 7. Unless discharge instructions indicate  otherwise, you may remove your bandages 48 hours after surgery and you may shower at that time.  You may have steri-strips (small skin tapes) in place directly over the incision.  These strips should be left on the skin for 7-10 days and will come off on their own.  If your surgeon used skin glue on the incision, you may shower in 24 hours.  The glue will flake off over the next 2-3 weeks.  Any sutures or staples will be removed at the office during your follow-up visit. 8. ACTIVITIES:  You may resume regular daily activities (gradually increasing) beginning the next day.  Wearing a good support bra or sports bra minimizes pain and swelling.  You may have sexual intercourse when it is comfortable. a. You may drive when you no longer are taking prescription pain medication, you can comfortably wear a seatbelt, and you can safely maneuver your car and apply brakes. b. RETURN TO WORK:  ______________________________________________________________________________________ 9. You should see your doctor in the office for a follow-up appointment approximately two weeks after your surgery.  Your doctor's nurse will typically make your follow-up appointment when she calls you with your pathology report.  Expect your pathology report 3-4 business days after your surgery.  You may call to check if you do not hear from [place] after three days. 10. OTHER INSTRUCTIONS: _______________________________________________________________________________________________ _____________________________________________________________________________________________________________________________________ _____________________________________________________________________________________________________________________________________ _____________________________________________________________________________________________________________________________________  WHEN TO CALL DR WAKEFIELD: 1. Fever over 101.0 2. Nausea and/or  vomiting. 3. Extreme swelling or bruising. 4. Continued bleeding from incision. 5. Increased pain, redness, or drainage  from the incision.  The clinic staff is available to answer your questions during regular business hours.  Please don't hesitate to call and ask to speak to one of the nurses for clinical concerns.  If you have a medical emergency, go to the nearest emergency room or call 911.  A surgeon from [facility] is always on call at the hospital.  For further questions, please visit [email or web address]   Post Anesthesia Home Care Instructions  Activity: Get plenty of rest for the remainder of the day. A responsible individual must stay with you for 24 hours following the procedure.  For the next 24 hours, DO NOT: -Drive a car -Paediatric nurse -Drink alcoholic beverages -Take any medication unless instructed by your physician -Make any legal decisions or sign important papers.  Meals: Start with liquid foods such as gelatin or soup. Progress to regular foods as tolerated. Avoid greasy, spicy, heavy foods. If nausea and/or vomiting occur, drink only clear liquids until the nausea and/or vomiting subsides. Call your physician if vomiting continues.  Special Instructions/Symptoms: Your throat may feel dry or sore from the anesthesia or the breathing tube placed in your throat during surgery. If this causes discomfort, gargle with warm salt water. The discomfort should disappear within 24 hours.  If you had a scopolamine patch placed behind your ear for the management of post- operative nausea and/or vomiting:  1. The medication in the patch is effective for 72 hours, after which it should be removed.  Wrap patch in a tissue and discard in the trash. Wash hands thoroughly with soap and water. 2. You may remove the patch earlier than 72 hours if you experience unpleasant side effects which may include dry mouth, dizziness or visual disturbances. 3. Avoid  touching the patch. Wash your hands with soap and water after contact with the patch.

## 2019-08-21 NOTE — Anesthesia Postprocedure Evaluation (Signed)
Anesthesia Post Note  Patient: Robin Castillo  Procedure(s) Performed: LEFT BREAST LUMPECTOMY WITH RADIOACTIVE SEED AND LEFT AXILLARY SENTINEL LYMPH NODE BIOPSY (Left Breast)     Patient location during evaluation: PACU Anesthesia Type: General Level of consciousness: awake and alert Pain management: pain level controlled Vital Signs Assessment: post-procedure vital signs reviewed and stable Respiratory status: spontaneous breathing, nonlabored ventilation, respiratory function stable and patient connected to nasal cannula oxygen Cardiovascular status: blood pressure returned to baseline and stable Postop Assessment: no apparent nausea or vomiting Anesthetic complications: no    Last Vitals:  Vitals:   08/21/19 1630 08/21/19 1651  BP: (!) 134/91 138/87  Pulse: 91 85  Resp: (!) 23 18  Temp:  (!) 36.4 C  SpO2: 100% 100%    Last Pain:  Vitals:   08/21/19 1651  TempSrc:   PainSc: 0-No pain                 Catalina Gravel

## 2019-08-21 NOTE — Interval H&P Note (Signed)
History and Physical Interval Note:  08/21/2019 2:19 PM  Robin Castillo  has presented today for surgery, with the diagnosis of left breast cancer.  The various methods of treatment have been discussed with the patient and family. After consideration of risks, benefits and other options for treatment, the patient has consented to  Procedure(s): LEFT BREAST LUMPECTOMY WITH RADIOACTIVE SEED AND LEFT AXILLARY SENTINEL LYMPH NODE BIOPSY (Left) as a surgical intervention.  The patient's history has been reviewed, patient examined, no change in status, stable for surgery.  I have reviewed the patient's chart and labs.  Questions were answered to the patient's satisfaction.     [name]

## 2019-08-21 NOTE — Anesthesia Procedure Notes (Signed)
Procedure Name: LMA Insertion Date/Time: 08/21/2019 2:41 PM Performed by: Genelle Bal, CRNA Pre-anesthesia Checklist: Patient identified, Emergency Drugs available, Suction available and Patient being monitored Patient Re-evaluated:Patient Re-evaluated prior to induction Oxygen Delivery Method: Circle system utilized Preoxygenation: Pre-oxygenation with 100% oxygen Induction Type: IV induction Ventilation: Mask ventilation without difficulty LMA: LMA inserted LMA Size: 4.0 Number of attempts: 1 Airway Equipment and Method: Bite block Placement Confirmation: positive ETCO2 Tube secured with: Tape Dental Injury: Teeth and Oropharynx as per pre-operative assessment

## 2019-08-21 NOTE — H&P (View-Only) (Signed)
49 yof referred by Dr [name] for new left breast cancer. she noted a left breast mass on her exam. She underwent mm that showed 1.2 cm mass with calcs associated with it. she had [place] that shows a 9x8x7 mm mass and axilla was negative. 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 No alcohol use  No drug use  Tobacco use  Never smoker.  Family History Tawni Pummel, RN; 08/01/2019 7:30 AM) Alcohol Abuse  Father. Arthritis  Father. Cerebrovascular Accident  Family Members In General. Depression  Sister. Diabetes Mellitus  Family Members In General, Mother. Heart Disease  Mother. Heart disease in female family member before age 69  Hypertension  Mother. Prostate Cancer  Father.  Pregnancy / Birth History Tawni Pummel, RN; 08/01/2019 7:30 AM) Age at menarche  81 years. Contraceptive History  Oral contraceptives. Gravida  4 Irregular periods  Length (months) of breastfeeding  3-6 Maternal age  48-35 Para  2  Other Problems Tawni Pummel, RN; 08/01/2019 7:30 AM) High blood pressure  Hypercholesterolemia    Review of Systems Sunday Spillers Ledford RN; 08/01/2019 7:30 AM) General Not Present- Appetite Loss, Chills, Fatigue, Fever, Night Sweats, Weight Gain and Weight Loss. Skin Not Present- Change in Wart/Mole, Dryness, Hives, Jaundice, New Lesions, Non-Healing Wounds, Rash and Ulcer. HEENT Not Present- Earache, Hearing Loss, Hoarseness, Nose Bleed, Oral Ulcers,  Ringing in the Ears, Seasonal Allergies, Sinus Pain, Sore Throat, Visual Disturbances, Wears glasses/contact lenses and Yellow Eyes. Respiratory Not Present- Bloody sputum, Chronic Cough, Difficulty Breathing, Snoring and Wheezing. Breast Not Present- Breast Mass, Breast Pain, Nipple Discharge and Skin Changes. Cardiovascular Not Present- Chest Pain, Difficulty Breathing Lying Down, Leg Cramps, Palpitations, Rapid Heart Rate, Shortness of Breath and Swelling of Extremities. Gastrointestinal Not Present- Abdominal Pain, Bloating, Bloody Stool, Change in Bowel Habits, Chronic diarrhea, Constipation, Difficulty Swallowing, Excessive gas, Gets full quickly at meals, Hemorrhoids, Indigestion, Nausea, Rectal Pain and Vomiting. Female Genitourinary Not Present- Frequency, Nocturia, Painful Urination, Pelvic Pain and Urgency. Musculoskeletal Present- Joint Pain and Joint Stiffness. Not Present- Back Pain, Muscle Pain, Muscle Weakness and Swelling of Extremities. Neurological Not Present- Decreased Memory, Fainting, Headaches, Numbness, Seizures, Tingling, Tremor, Trouble walking and Weakness. Psychiatric Not Present- Anxiety, Bipolar, Change in Sleep Pattern, Depression, Fearful and Frequent crying. Endocrine Not Present- Cold Intolerance, Excessive Hunger, Hair Changes, Heat Intolerance, Hot flashes and New Diabetes. Hematology Not Present- Blood Thinners, Easy Bruising, Excessive bleeding, Gland problems, HIV and Persistent Infections.   Physical Exam [name] MD; 08/01/2019 2:29 PM) General Mental Status-Alert. Orientation-Oriented X3. Head and Neck Trachea-midline. Eye Sclera/Conjunctiva - Bilateral-No scleral icterus. Chest and Lung Exam Chest and lung exam reveals -quiet, even and easy respiratory effort with no use of accessory muscles. Breast Nipples-No Discharge. Breast Lump-No Palpable Breast Mass. Neurologic Neurologic evaluation reveals -alert and oriented x 3  with no impairment of recent or remote memory. Lymphatic Head & Neck General Head & Neck Lymphatics: Bilateral - Description - Normal. Axillary General Axillary Region: Bilateral -  Description - Normal. 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 We discussed all of the different options for treatment for breast cancer including surgery, chemotherapy, radiation therapy, Herceptin, and antiestrogen therapy. We discussed a sentinel lymph node biopsy as she does not appear to having lymph node involvement right now. We discussed the performance of that with injection of radioactive tracer. We discussed that there is a chance of having a positive node with a sentinel lymph node biopsy and we will await the permanent pathology to make any other first further decisions in terms of her treatment. We discussed up to a 5% risk lifetime of chronic shoulder pain as well as lymphedema associated with a sentinel lymph node biopsy. We discussed the options for treatment of the breast cancer which included lumpectomy versus a mastectomy. We discussed the performance of the lumpectomy with radioactive seed placement. We discussed a 5-10% chance of a positive margin requiring reexcision in the operating room. We also discussed that she will need radiation therapy if she undergoes lumpectomy. We discussed mastectomy and the postoperative care for that as well. Mastectomy can be followed by reconstruction. The decision for lumpectomy vs mastectomy has no impact on decision for chemotherapy. Most mastectomy patients will not need radiation therapy. We discussed that there is no difference in her survival whether she undergoes lumpectomy with radiation therapy or  antiestrogen therapy versus a mastectomy. There is also no real difference between her recurrence in the breast. We discussed the risks of operation including bleeding, infection, possible reoperation. She understands her further therapy will be based on what her stages at the time of her operation.

## 2019-08-21 NOTE — Progress Notes (Signed)
Assisted Dr. Turk with left, ultrasound guided, pectoralis block. Side rails up, monitors on throughout procedure. See vital signs in flow sheet. Tolerated Procedure well. 

## 2019-08-22 ENCOUNTER — Encounter: Payer: Self-pay | Admitting: *Deleted

## 2019-08-27 ENCOUNTER — Encounter: Payer: Self-pay | Admitting: *Deleted

## 2019-08-27 LAB — SURGICAL PATHOLOGY

## 2019-08-28 ENCOUNTER — Telehealth: Payer: Self-pay | Admitting: *Deleted

## 2019-08-28 NOTE — Telephone Encounter (Signed)
Ordered oncotype per Dr. Magrinat.  Faxed requisition to pathology. 

## 2019-08-30 ENCOUNTER — Other Ambulatory Visit: Payer: Self-pay | Admitting: General Surgery

## 2019-09-03 ENCOUNTER — Other Ambulatory Visit: Payer: Self-pay

## 2019-09-03 ENCOUNTER — Encounter: Payer: Self-pay | Admitting: *Deleted

## 2019-09-03 ENCOUNTER — Encounter (HOSPITAL_BASED_OUTPATIENT_CLINIC_OR_DEPARTMENT_OTHER): Payer: Self-pay | Admitting: General Surgery

## 2019-09-06 ENCOUNTER — Other Ambulatory Visit (HOSPITAL_COMMUNITY)
Admission: RE | Admit: 2019-09-06 | Discharge: 2019-09-06 | Disposition: A | Discharge status: Home / Self Care | Payer: BC Managed Care – PPO | Source: Ambulatory Visit | Attending: General Surgery | Admitting: General Surgery

## 2019-09-06 DIAGNOSIS — Z20822 Contact with and (suspected) exposure to covid-19: Secondary | ICD-10-CM | POA: Diagnosis not present

## 2019-09-06 DIAGNOSIS — Z01812 Encounter for preprocedural laboratory examination: Secondary | ICD-10-CM | POA: Insufficient documentation

## 2019-09-07 LAB — SARS CORONAVIRUS 2 (TAT 6-24 HRS): SARS Coronavirus 2: NEGATIVE

## 2019-09-10 ENCOUNTER — Ambulatory Visit: Payer: BC Managed Care – PPO | Attending: General Surgery | Admitting: Physical Therapy

## 2019-09-10 ENCOUNTER — Encounter: Payer: Self-pay | Admitting: Physical Therapy

## 2019-09-10 ENCOUNTER — Telehealth: Payer: Self-pay | Admitting: *Deleted

## 2019-09-10 ENCOUNTER — Other Ambulatory Visit: Payer: Self-pay

## 2019-09-10 ENCOUNTER — Encounter (HOSPITAL_COMMUNITY): Payer: Self-pay | Admitting: General Surgery

## 2019-09-10 DIAGNOSIS — Z9189 Other specified personal risk factors, not elsewhere classified: Secondary | ICD-10-CM

## 2019-09-10 DIAGNOSIS — C50412 Malignant neoplasm of upper-outer quadrant of left female breast: Secondary | ICD-10-CM | POA: Insufficient documentation

## 2019-09-10 DIAGNOSIS — Z170 Estrogen receptor positive status [ER+]: Secondary | ICD-10-CM | POA: Diagnosis present

## 2019-09-10 DIAGNOSIS — Z483 Aftercare following surgery for neoplasm: Secondary | ICD-10-CM | POA: Insufficient documentation

## 2019-09-10 DIAGNOSIS — R293 Abnormal posture: Secondary | ICD-10-CM | POA: Insufficient documentation

## 2019-09-10 NOTE — Telephone Encounter (Signed)
Received oncotype score of 16/15%. Physician team notified. Called pt with results and left vm stating no chemo is recommended. Contact information provided for questions or needs.

## 2019-09-10 NOTE — Therapy (Signed)
[place], [place], [address] Phone: [phone]   Fax:  [phone]  Physical Therapy Treatment  Patient Details  Name: Robin Castillo MRN: [id number] Date of Birth: 11/11/1970 Referring Provider (PT): Dr. [name]   Encounter Date: 09/10/2019  PT End of Session - 09/10/19 1349    Visit Number  2    Number of Visits  2    PT Start Time  9169       Past Medical History:  Diagnosis Date  . Arthritis    per patient new onset in finger joints  . BV (bacterial vaginosis) 2012  . Cancer ([place]) 2021   left breast   . Cyst of ovary, right 2003  . Family history of prostate cancer in father   . GBS carrier   . H/O rubella   . H/O varicella   . History of PCOS 06/2005  . Hypertension   . Infertility, female   . Irregular menses 11/2001  . Leukorrhea 04/2007   phsiological  . Pregnancy induced hypertension   . Psychosocial stressors 10/2009    Past Surgical History:  Procedure Laterality Date  . BREAST ENHANCEMENT SURGERY    . BREAST LUMPECTOMY WITH RADIOACTIVE SEED AND SENTINEL LYMPH NODE BIOPSY Left 08/21/2019   Procedure: LEFT BREAST LUMPECTOMY WITH RADIOACTIVE SEED AND LEFT AXILLARY SENTINEL LYMPH NODE BIOPSY;  Surgeon: [name], MD;  Location: [facility];  Service: General;  Laterality: Left;  . CESAREAN SECTION      There were no vitals filed for this visit.  Subjective Assessment - 09/10/19 1314    Subjective  Patient reports she underwent a left lumpectomy and sentinel node biopsy (1 node removed with micromets) on 08/21/2019. She is scheduled for a re-excision on 09/17/2019 to oobtain clear margins. She received Oncotype restuls which were 16 so no need for chemo.    Pertinent History  Patient was diagnosed on 07/25/2019 with left grade II invasive ductal carcinoma breast cancer. Patient reports she underwent a left lumpectomy and sentinel node biopsy (1 node  removed with micromets) on 08/21/2019. It is ER/PR positive and HER2 negative with a Ki67 of 2%.    Patient Stated Goals  See if my arm is ok    Currently in Pain?  No/denies         [facility] PT Assessment - 09/10/19 0001      Assessment   Medical Diagnosis  s/p left lumpectomy and SLNB    Referring Provider (PT)  Dr. [name]    Onset Date/Surgical Date  08/21/19    Hand Dominance  Right    Prior Therapy  Baselines      Precautions   Precautions  Other (comment)    Precaution Comments  recent surgery; left lymphedema risk      Restrictions   Weight Bearing Restrictions  No      Balance Screen   Has the patient fallen in the past 6 months  No    Has the patient had a decrease in activity level because of a fear of falling?   No    Is the patient reluctant to leave their home because of a fear of falling?   No      Home Environment   Living Environment  Private residence    Living Arrangements  Children;Spouse/significant other   Husband, 63 and 4 y.o. kids   Available Help at Discharge  Family      Prior Function  Level of Independence  Independent    Vocation  Full time employment    Writer nurse    Leisure  She is walking daily for 1 hour      Cognition   Overall Cognitive Status  Within Functional Limits for tasks assessed      Observation/Other Assessments   Observations  Incisions are still covered with steri-strips. Mild edema present in lfet breast; sports bra recommended a pt is not wearing a bra. Numbness and tingling present in left upper bra and posterior upper arm. L-Dex score -0.8      Posture/Postural Control   Posture/Postural Control  Postural limitations    Postural Limitations  Rounded Shoulders;Forward head      ROM / Strength   AROM / PROM / Strength  AROM      AROM   AROM Assessment Site  Shoulder    Right/Left Shoulder  Left    Left Shoulder Extension  60 Degrees    Left Shoulder Flexion  139 Degrees     Left Shoulder ABduction  146 Degrees    Left Shoulder Internal Rotation  71 Degrees    Left Shoulder External Rotation  82 Degrees      Strength   Overall Strength  Within functional limits for tasks performed        LYMPHEDEMA/ONCOLOGY QUESTIONNAIRE - 09/10/19 1325      Type   Cancer Type  Left breast cancer      Surgeries   Lumpectomy Date  08/21/19    Sentinel Lymph Node Biopsy Date  08/21/19    Number Lymph Nodes Removed  1      Treatment   Active Chemotherapy Treatment  No    Past Chemotherapy Treatment  No    Active Radiation Treatment  No    Past Radiation Treatment  No    Current Hormone Treatment  No    Past Hormone Therapy  No      What other symptoms do you have   Are you Having Heaviness or Tightness  No    Are you having Pain  Yes    Are you having pitting edema  No    Is it Hard or Difficult finding clothes that fit  No    Do you have infections  No    Is there Decreased scar mobility  No    Stemmer Sign  No      Lymphedema Assessments   Lymphedema Assessments  Upper extremities      Right Upper Extremity Lymphedema   10 cm Proximal to Olecranon Process  25 cm    Olecranon Process  23.8 cm    10 cm Proximal to Ulnar Styloid Process  22 cm    Just Proximal to Ulnar Styloid Process  16.8 cm    Across Hand at [employer]  20.5 cm    At [place] of 2nd Digit  6.4 cm      Left Upper Extremity Lymphedema   10 cm Proximal to Olecranon Process  24.6 cm    Olecranon Process  23 cm    10 cm Proximal to Ulnar Styloid Process  21 cm    Just Proximal to Ulnar Styloid Process  16 cm    Across Hand at [employer]  20 cm    At [place] of 2nd Digit  6.5 cm        Quick Dash - 09/10/19 0001    Open a tight or new  jar  No difficulty    Do heavy household chores (wash walls, wash floors)  No difficulty    Carry a shopping bag or briefcase  No difficulty    Wash your back  Mild difficulty    Use a knife to cut food  No difficulty    Recreational activities  in which you take some force or impact through your arm, shoulder, or hand (golf, hammering, tennis)  Mild difficulty    During the past week, to what extent has your arm, shoulder or hand problem interfered with your normal social activities with family, friends, neighbors, or groups?  Not at all    During the past week, to what extent has your arm, shoulder or hand problem limited your work or other regular daily activities  Not at all    Arm, shoulder, or hand pain.  None    Tingling (pins and needles) in your arm, shoulder, or hand  Mild    Difficulty Sleeping  No difficulty    DASH Score  6.82 %            PT Education - 09/10/19 1348    Education Details  Lymphedema risk reduction and post op HEP    Person(s) Educated  Patient    Methods  Explanation    Comprehension  Verbalized understanding          PT Long Term Goals - 09/10/19 1415      PT LONG TERM GOAL #1   Title  Patient will demonstrate she has regained full shoulder ROM and function post operatively compared to baselines.    Time  8    Period  Weeks    Status  Achieved            Plan - 09/10/19 1358    Clinical Impression Statement  Patient is doing well s/p left lumpectomy with a sentinel node biopsy. She had 1 node with micromets. Her Oncotype score was low so she does not need chemotherapy. She will undergo radiation which may or may not include her axillary region. Incisions are healing well but are still covered with steri-strips. There is thickness present in her left axilla at that incision. Her shoulder ROM is nearly back to baseline and there is no sign of lymphedema. L-Dex score is -0.8.    PT Next Visit Plan  L-Dex every 3 months    PT Home Exercise Plan  Post op shoulder ROM HEP    Consulted and Agree with Plan of Care  Patient       Patient will benefit from skilled therapeutic intervention in order to improve the following deficits and impairments:  Postural dysfunction, Decreased range  of motion, Pain, Impaired UE functional use, Decreased knowledge of precautions  Visit Diagnosis: Malignant neoplasm of upper-outer quadrant of left breast in female, estrogen receptor positive ([place]) - Plan: PT plan of care cert/re-cert  Abnormal posture - Plan: PT plan of care cert/re-cert  Aftercare following surgery for neoplasm - Plan: PT plan of care cert/re-cert  At risk for lymphedema - Plan: PT plan of care cert/re-cert  The patient was assessed using the L-Dex machine today to produce a lymphedema index baseline score. The patient will be reassessed on a regular basis (typically every 3 months) to obtain new L-Dex scores. If the score is > 6.5 points away from his/her baseline score indicating onset of subclinical lymphedema, it will be recommended to wear a compression garment for 4 weeks, 12 hours per day  and then be reassessed. If the score continues to be > 6.5 points from baseline at reassessment, we will initiate lymphedema treatment. Assessing in this manner has a 95% rate of preventing clinically significant lymphedema.    Problem List Patient Active Problem List   Diagnosis Date Noted  . Genetic testing 08/10/2019  . Family history of prostate cancer in father   . Malignant neoplasm of upper-outer quadrant of left breast in female, estrogen receptor positive ([place]) 07/30/2019    Annia Friendly, PT 09/10/19 2:18 PM   [place], [place], [address] Phone: [phone]   Fax:  [phone]  Name: Robin Castillo MRN: [id number] Date of Birth: 16-Apr-1971

## 2019-09-10 NOTE — Progress Notes (Signed)
Pt was scheduled for this surgery today at the [facility], got moved to here for tomorrow. Spoke with pt for pre-op call. Pt was also seen here in February for a PAT appt. Pt verified that nothing had changed with her medical and surgical history since. I did update her medication list.   Pt had her Covid test done on 09/06/19 and it's negative. She states she's been in quarantine since the test was done. She did go to a physical therapy session today but she wore a mask and social distanced.

## 2019-09-11 ENCOUNTER — Ambulatory Visit (HOSPITAL_BASED_OUTPATIENT_CLINIC_OR_DEPARTMENT_OTHER): Payer: BC Managed Care – PPO | Admitting: Anesthesiology

## 2019-09-11 ENCOUNTER — Other Ambulatory Visit: Payer: Self-pay

## 2019-09-11 ENCOUNTER — Inpatient Hospital Stay (HOSPITAL_COMMUNITY): Admission: RE | Admit: 2019-09-11 | Payer: BC Managed Care – PPO | Source: Ambulatory Visit

## 2019-09-11 ENCOUNTER — Encounter (HOSPITAL_BASED_OUTPATIENT_CLINIC_OR_DEPARTMENT_OTHER)
Admission: RE | Disposition: A | Discharge status: Home / Self Care | Payer: Self-pay | Source: Home / Self Care | Attending: General Surgery

## 2019-09-11 ENCOUNTER — Ambulatory Visit (HOSPITAL_COMMUNITY)
Admission: RE | Admit: 2019-09-11 | Discharge: 2019-09-11 | Disposition: A | Discharge status: Home / Self Care | Payer: BC Managed Care – PPO | Attending: General Surgery | Admitting: General Surgery

## 2019-09-11 ENCOUNTER — Encounter (HOSPITAL_BASED_OUTPATIENT_CLINIC_OR_DEPARTMENT_OTHER): Payer: Self-pay | Admitting: General Surgery

## 2019-09-11 DIAGNOSIS — N6012 Diffuse cystic mastopathy of left breast: Secondary | ICD-10-CM | POA: Insufficient documentation

## 2019-09-11 DIAGNOSIS — Z170 Estrogen receptor positive status [ER+]: Secondary | ICD-10-CM | POA: Diagnosis not present

## 2019-09-11 DIAGNOSIS — C50912 Malignant neoplasm of unspecified site of left female breast: Secondary | ICD-10-CM | POA: Insufficient documentation

## 2019-09-11 DIAGNOSIS — I10 Essential (primary) hypertension: Secondary | ICD-10-CM | POA: Diagnosis not present

## 2019-09-11 HISTORY — PX: RE-EXCISION OF BREAST LUMPECTOMY: SHX6048

## 2019-09-11 LAB — POCT PREGNANCY, URINE: Preg Test, Ur: NEGATIVE

## 2019-09-11 SURGERY — EXCISION, LESION, BREAST
Anesthesia: General | Site: Breast | Laterality: Left

## 2019-09-11 MED ORDER — LACTATED RINGERS IV SOLN: INTRAVENOUS | Status: DC

## 2019-09-11 MED ORDER — DEXAMETHASONE SODIUM PHOSPHATE 4 MG/ML IJ SOLN
INTRAMUSCULAR | Status: DC | PRN
  Administered 2019-09-11: 5 mg via INTRAVENOUS

## 2019-09-11 MED ORDER — ONDANSETRON HCL 4 MG/2ML IJ SOLN: 4.0000 mg | Freq: Once | INTRAMUSCULAR | Status: DC | PRN

## 2019-09-11 MED ORDER — GABAPENTIN 100 MG PO CAPS
ORAL_CAPSULE | ORAL | Status: AC
  Filled 2019-09-11: qty 1

## 2019-09-11 MED ORDER — ACETAMINOPHEN 500 MG PO TABS
ORAL_TABLET | ORAL | Status: AC
  Filled 2019-09-11: qty 2

## 2019-09-11 MED ORDER — FENTANYL CITRATE (PF) 100 MCG/2ML IJ SOLN: 25.0000 ug | INTRAMUSCULAR | Status: DC | PRN

## 2019-09-11 MED ORDER — ACETAMINOPHEN 500 MG PO TABS
1000.0000 mg | ORAL_TABLET | ORAL | Status: AC
  Administered 2019-09-11: 1000 mg via ORAL

## 2019-09-11 MED ORDER — GABAPENTIN 100 MG PO CAPS
100.0000 mg | ORAL_CAPSULE | ORAL | Status: AC
  Administered 2019-09-11: 100 mg via ORAL

## 2019-09-11 MED ORDER — FENTANYL CITRATE (PF) 100 MCG/2ML IJ SOLN
INTRAMUSCULAR | Status: DC | PRN
  Administered 2019-09-11: 50 ug via INTRAVENOUS
  Administered 2019-09-11 (×2): 25 ug via INTRAVENOUS

## 2019-09-11 MED ORDER — LIDOCAINE HCL (PF) 1 % IJ SOLN
INTRAMUSCULAR | Status: AC
  Filled 2019-09-11: qty 30

## 2019-09-11 MED ORDER — BUPIVACAINE HCL (PF) 0.25 % IJ SOLN
INTRAMUSCULAR | Status: DC | PRN
  Administered 2019-09-11: 4 mL

## 2019-09-11 MED ORDER — FENTANYL CITRATE (PF) 100 MCG/2ML IJ SOLN
INTRAMUSCULAR | Status: AC
  Filled 2019-09-11: qty 2

## 2019-09-11 MED ORDER — ONDANSETRON HCL 4 MG/2ML IJ SOLN
INTRAMUSCULAR | Status: AC
  Filled 2019-09-11: qty 2

## 2019-09-11 MED ORDER — ENSURE PRE-SURGERY PO LIQD: 296.0000 mL | Freq: Once | ORAL | Status: DC

## 2019-09-11 MED ORDER — LIDOCAINE HCL (CARDIAC) PF 100 MG/5ML IV SOSY
PREFILLED_SYRINGE | INTRAVENOUS | Status: DC | PRN
  Administered 2019-09-11: 40 mg via INTRAVENOUS

## 2019-09-11 MED ORDER — CEFAZOLIN SODIUM-DEXTROSE 2-4 GM/100ML-% IV SOLN
INTRAVENOUS | Status: AC
  Filled 2019-09-11: qty 100

## 2019-09-11 MED ORDER — LIDOCAINE 2% (20 MG/ML) 5 ML SYRINGE
INTRAMUSCULAR | Status: AC
  Filled 2019-09-11: qty 5

## 2019-09-11 MED ORDER — KETOROLAC TROMETHAMINE 15 MG/ML IJ SOLN
15.0000 mg | INTRAMUSCULAR | Status: AC
  Administered 2019-09-11: 15 mg via INTRAVENOUS

## 2019-09-11 MED ORDER — CEFAZOLIN SODIUM-DEXTROSE 2-4 GM/100ML-% IV SOLN
2.0000 g | INTRAVENOUS | Status: AC
  Administered 2019-09-11: 2 g via INTRAVENOUS

## 2019-09-11 MED ORDER — PROPOFOL 10 MG/ML IV BOLUS
INTRAVENOUS | Status: AC
  Filled 2019-09-11: qty 20

## 2019-09-11 MED ORDER — OXYCODONE HCL 5 MG PO TABS: 5.0000 mg | ORAL_TABLET | Freq: Once | ORAL | Status: DC | PRN

## 2019-09-11 MED ORDER — OXYCODONE HCL 5 MG/5ML PO SOLN: 5.0000 mg | Freq: Once | ORAL | Status: DC | PRN

## 2019-09-11 MED ORDER — KETOROLAC TROMETHAMINE 15 MG/ML IJ SOLN
INTRAMUSCULAR | Status: AC
  Filled 2019-09-11: qty 1

## 2019-09-11 MED ORDER — PROPOFOL 10 MG/ML IV BOLUS
INTRAVENOUS | Status: DC | PRN
  Administered 2019-09-11: 200 mg via INTRAVENOUS

## 2019-09-11 MED ORDER — MIDAZOLAM HCL 5 MG/5ML IJ SOLN
INTRAMUSCULAR | Status: DC | PRN
  Administered 2019-09-11: 2 mg via INTRAVENOUS

## 2019-09-11 SURGICAL SUPPLY — 56 items
ADH SKN CLS APL DERMABOND .7 (GAUZE/BANDAGES/DRESSINGS) ×1
APL PRP STRL LF DISP 70% ISPRP (MISCELLANEOUS) ×1
BINDER BREAST LRG (GAUZE/BANDAGES/DRESSINGS) ×1 IMPLANT
BINDER BREAST MEDIUM (GAUZE/BANDAGES/DRESSINGS) IMPLANT
BINDER BREAST XLRG (GAUZE/BANDAGES/DRESSINGS) IMPLANT
BINDER BREAST XXLRG (GAUZE/BANDAGES/DRESSINGS) IMPLANT
BLADE SURG 15 STRL LF DISP TIS (BLADE) ×1 IMPLANT
BLADE SURG 15 STRL SS (BLADE) ×2
CANISTER SUCT 1200ML W/VALVE (MISCELLANEOUS) ×2 IMPLANT
CHLORAPREP W/TINT 26 (MISCELLANEOUS) ×2 IMPLANT
CLIP VESOCCLUDE SM WIDE 6/CT (CLIP) IMPLANT
COVER BACK TABLE 60X90IN (DRAPES) ×2 IMPLANT
COVER MAYO STAND STRL (DRAPES) ×2 IMPLANT
COVER WAND RF STERILE (DRAPES) IMPLANT
DECANTER SPIKE VIAL GLASS SM (MISCELLANEOUS) IMPLANT
DERMABOND ADVANCED (GAUZE/BANDAGES/DRESSINGS) ×1
DERMABOND ADVANCED .7 DNX12 (GAUZE/BANDAGES/DRESSINGS) IMPLANT
DRAPE LAPAROSCOPIC ABDOMINAL (DRAPES) ×2 IMPLANT
DRAPE UTILITY XL STRL (DRAPES) ×2 IMPLANT
DRSG TEGADERM 4X4.75 (GAUZE/BANDAGES/DRESSINGS) ×2 IMPLANT
ELECT COATED BLADE 2.86 ST (ELECTRODE) ×2 IMPLANT
ELECT REM PT RETURN 9FT ADLT (ELECTROSURGICAL) ×2
ELECTRODE REM PT RTRN 9FT ADLT (ELECTROSURGICAL) ×1 IMPLANT
GAUZE SPONGE 4X4 12PLY STRL LF (GAUZE/BANDAGES/DRESSINGS) ×2 IMPLANT
GLOVE BIO SURGEON STRL SZ7 (GLOVE) ×4 IMPLANT
GLOVE BIOGEL PI IND STRL 7.5 (GLOVE) ×1 IMPLANT
GLOVE BIOGEL PI INDICATOR 7.5 (GLOVE) ×1
GOWN STRL REUS W/ TWL LRG LVL3 (GOWN DISPOSABLE) ×3 IMPLANT
GOWN STRL REUS W/ TWL XL LVL3 (GOWN DISPOSABLE) IMPLANT
GOWN STRL REUS W/TWL LRG LVL3 (GOWN DISPOSABLE) ×2
GOWN STRL REUS W/TWL XL LVL3 (GOWN DISPOSABLE) ×2
HEMOSTAT ARISTA ABSORB 3G PWDR (HEMOSTASIS) IMPLANT
KIT MARKER MARGIN INK (KITS) ×1 IMPLANT
NDL HYPO 25X1 1.5 SAFETY (NEEDLE) ×1 IMPLANT
NEEDLE HYPO 25X1 1.5 SAFETY (NEEDLE) ×2 IMPLANT
NS IRRIG 1000ML POUR BTL (IV SOLUTION) ×1 IMPLANT
PACK BASIN DAY SURGERY FS (CUSTOM PROCEDURE TRAY) ×2 IMPLANT
PENCIL SMOKE EVACUATOR (MISCELLANEOUS) ×2 IMPLANT
RETRACTOR ONETRAX LX 90X20 (MISCELLANEOUS) IMPLANT
SLEEVE SCD COMPRESS KNEE MED (MISCELLANEOUS) ×2 IMPLANT
SPONGE LAP 4X18 RFD (DISPOSABLE) ×2 IMPLANT
STRIP CLOSURE SKIN 1/2X4 (GAUZE/BANDAGES/DRESSINGS) ×1 IMPLANT
SUT MNCRL AB 3-0 PS2 18 (SUTURE) IMPLANT
SUT MNCRL AB 4-0 PS2 18 (SUTURE) IMPLANT
SUT MON AB 5-0 PS2 18 (SUTURE) ×1 IMPLANT
SUT SILK 2 0 SH (SUTURE) IMPLANT
SUT VIC AB 2-0 SH 27 (SUTURE) ×2
SUT VIC AB 2-0 SH 27XBRD (SUTURE) ×1 IMPLANT
SUT VIC AB 3-0 SH 27 (SUTURE) ×2
SUT VIC AB 3-0 SH 27X BRD (SUTURE) ×1 IMPLANT
SUT VIC AB 5-0 PS2 18 (SUTURE) IMPLANT
SUT VICRYL AB 3 0 TIES (SUTURE) IMPLANT
SYR CONTROL 10ML LL (SYRINGE) ×2 IMPLANT
TOWEL GREEN STERILE FF (TOWEL DISPOSABLE) ×2 IMPLANT
TUBE CONNECTING 20X1/4 (TUBING) ×2 IMPLANT
YANKAUER SUCT BULB TIP NO VENT (SUCTIONS) ×2 IMPLANT

## 2019-09-11 NOTE — Anesthesia Procedure Notes (Signed)
Procedure Name: LMA Insertion Date/Time: 09/11/2019 1:06 PM Performed by: Maryella Shivers, CRNA Pre-anesthesia Checklist: Patient identified, Emergency Drugs available, Suction available and Patient being monitored Patient Re-evaluated:Patient Re-evaluated prior to induction Oxygen Delivery Method: Circle system utilized Preoxygenation: Pre-oxygenation with 100% oxygen Induction Type: IV induction Ventilation: Mask ventilation without difficulty LMA: LMA inserted LMA Size: 4.0 Number of attempts: 1 Airway Equipment and Method: Bite block Placement Confirmation: positive ETCO2 Tube secured with: Tape Dental Injury: Teeth and Oropharynx as per pre-operative assessment

## 2019-09-11 NOTE — Interval H&P Note (Signed)
History and Physical Interval Note:  09/11/2019 12:18 PM  Robin Castillo  has presented today for surgery, with the diagnosis of LEFT BREAST CANCER.  The various methods of treatment have been discussed with the patient and family. After consideration of risks, benefits and other options for treatment, the patient has consented to  Procedure(s): LEFT RE-EXCISION OF BREAST MARGIN (Left) as a surgical intervention.  The patient's history has been reviewed, patient examined, no change in status, stable for surgery.  I have reviewed the patient's chart and labs.  Questions were answered to the patient's satisfaction.     [name]

## 2019-09-11 NOTE — Anesthesia Preprocedure Evaluation (Addendum)
Anesthesia Evaluation  Patient identified by MRN, date of birth, ID band Patient awake    Reviewed: Allergy & Precautions, NPO status , Patient's Chart, lab work & pertinent test results  Airway Mallampati: II  TM Distance: >3 FB Neck ROM: Full    Dental  (+) Teeth Intact, Dental Advisory Given   Pulmonary    breath sounds clear to auscultation       Cardiovascular hypertension,  Rhythm:Regular Rate:Normal     Neuro/Psych    GI/Hepatic   Endo/Other    Renal/GU      Musculoskeletal   Abdominal   Peds  Hematology   Anesthesia Other Findings   Reproductive/Obstetrics                            Anesthesia Physical Anesthesia Plan  ASA: II  Anesthesia Plan: General   Post-op Pain Management:    Induction: Intravenous  PONV Risk Score and Plan: Ondansetron and Dexamethasone  Airway Management Planned: LMA  Additional Equipment:   Intra-op Plan:   Post-operative Plan:   Informed Consent: I have reviewed the patients History and Physical, chart, labs and discussed the procedure including the risks, benefits and alternatives for the proposed anesthesia with the patient or authorized representative who has indicated his/her understanding and acceptance.     Dental advisory given  Plan Discussed with: CRNA and Anesthesiologist  Anesthesia Plan Comments:         Anesthesia Quick Evaluation

## 2019-09-11 NOTE — Transfer of Care (Signed)
Immediate Anesthesia Transfer of Care Note  Patient: Robin Castillo  Procedure(s) Performed: LEFT RE-EXCISION OF BREAST MARGIN (Left Breast)  Patient Location: PACU  Anesthesia Type:General  Level of Consciousness: sedated  Airway & Oxygen Therapy: Patient Spontanous Breathing and Patient connected to face mask oxygen  Post-op Assessment: Report given to RN and Post -op Vital signs reviewed and stable  Post vital signs: Reviewed and stable  Last Vitals:  Vitals Value Taken Time  BP 109/78 09/11/19 1341  Temp    Pulse 60 09/11/19 1344  Resp 16 09/11/19 1344  SpO2 100 % 09/11/19 1344  Vitals shown include unvalidated device data.  Last Pain:  Vitals:   09/11/19 1113  TempSrc: Temporal  PainSc: 0-No pain      Patients Stated Pain Goal: 1 ([id number])  Complications: No apparent anesthesia complications

## 2019-09-11 NOTE — Anesthesia Postprocedure Evaluation (Signed)
Anesthesia Post Note  Patient: Robin Castillo  Procedure(s) Performed: LEFT RE-EXCISION OF BREAST MARGIN (Left Breast)     Patient location during evaluation: PACU Anesthesia Type: General Level of consciousness: awake and alert Pain management: pain level controlled Vital Signs Assessment: post-procedure vital signs reviewed and stable Respiratory status: spontaneous breathing, nonlabored ventilation, respiratory function stable and patient connected to nasal cannula oxygen Cardiovascular status: blood pressure returned to baseline and stable Postop Assessment: no apparent nausea or vomiting Anesthetic complications: no    Last Vitals:  Vitals:   09/11/19 1341 09/11/19 1345  BP: 109/78 119/88  Pulse: 63 60  Resp: 14 16  Temp: 36.4 C   SpO2: 100% 100%    Last Pain:  Vitals:   09/11/19 1355  TempSrc:   PainSc: 0-No pain                 Barnet Glasgow

## 2019-09-11 NOTE — Op Note (Signed)
Preoperative diagnosis:clinical stage I left breast cancer s/p lump/sn with positive margin, oncotype 16 Postoperative diagnosis: Same as above Procedure:re-excision of left breast margins Surgeon: Dr. Serita Grammes Anesthesia: General  Specimens:additional left anterior and inferior margins marked short superior, long lateral and double deep Estimated blood [id number] cc Complications: None Drains: None Sponge and count was correct at completion Disposition to recovery in stable condition  Indications: 82 yof referred by Dr [name] for new left breast cancer. she noted a left breast mass on her exam. She underwent mm that showed 1.2 cm mass with calcs associated with it. she had [place] that shows a 9x8x7 mm mass and axilla was negative. 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 Genetics was negative. Her anterior margin was positive, node with micromet and oncotype low. We discussed re-excision of margin.    Procedure: After informed consent was obtained shewas placed under general anesthesia without complication.She was given antibiotics. SCDs were in place. She was then prepped and draped in the standard sterile surgical fashion. A surgical timeout was then performed.  I infiltrated marcaine. I reentered the old incision. I opened the cavity.  I then removed the two additional margins as above.  They were marked as above.   The implant was not disturbed.  I then obtained hemostasis.  I closed the breast tissue with 2-0 vicryl, 3-0 vicryl and5-0 monocryl. I then placed glue and steristrips.  She had a binder placed. She was extubated and transferred to recovery stable

## 2019-09-11 NOTE — Discharge Instructions (Signed)
Next dose of Tylenol at 5:30 PM   [employer] Phone Number [phone]  BREAST BIOPSY/ PARTIAL MASTECTOMY: POST OP INSTRUCTIONS Take 400 mg of ibuprofen every 8 hours or 650 mg tylenol every 6 hours for next 72 hours then as needed. Use ice several times daily also. Always review your discharge instruction sheet given to you by the facility where your surgery was performed.  IF YOU HAVE DISABILITY OR FAMILY LEAVE FORMS, YOU MUST BRING THEM TO THE OFFICE FOR PROCESSING.  DO NOT GIVE THEM TO YOUR DOCTOR.  1. A prescription for pain medication may be given to you upon discharge.  Take your pain medication as prescribed, if needed.  If narcotic pain medicine is not needed, then you may take acetaminophen (Tylenol), naprosyn (Alleve) or ibuprofen (Advil) as needed. 2. Take your usually prescribed medications unless otherwise directed 3. If you need a refill on your pain medication, please contact your pharmacy.  They will contact our office to request authorization.  Prescriptions will not be filled after 5pm or on week-ends. 4. You should eat very light the first 24 hours after surgery, such as soup, crackers, pudding, etc.  Resume your normal diet the day after surgery. 5. Most patients will experience some swelling and bruising in the breast.  Ice packs and a good support bra will help.  Wear the breast binder provided or a sports bra for 72 hours day and night.  After that wear a sports bra during the day until you return to the office. Swelling and bruising can take several days to resolve.  6. It is common to experience some constipation if taking pain medication after surgery.  Increasing fluid intake and taking a stool softener will usually help or prevent this problem from occurring.  A mild laxative (Milk of Magnesia or Miralax) should be taken according to package directions if there are no bowel movements after 48 hours. 7. Unless discharge instructions indicate  otherwise, you may remove your bandages 48 hours after surgery and you may shower at that time.  You may have steri-strips (small skin tapes) in place directly over the incision.  These strips should be left on the skin for 7-10 days and will come off on their own.  If your surgeon used skin glue on the incision, you may shower in 24 hours.  The glue will flake off over the next 2-3 weeks.  Any sutures or staples will be removed at the office during your follow-up visit. 8. ACTIVITIES:  You may resume regular daily activities (gradually increasing) beginning the next day.  Wearing a good support bra or sports bra minimizes pain and swelling.  You may have sexual intercourse when it is comfortable. a. You may drive when you no longer are taking prescription pain medication, you can comfortably wear a seatbelt, and you can safely maneuver your car and apply brakes. b. RETURN TO WORK:  ______________________________________________________________________________________ 9. You should see your doctor in the office for a follow-up appointment approximately two weeks after your surgery.  Your doctor's nurse will typically make your follow-up appointment when she calls you with your pathology report.  Expect your pathology report 3-4 business days after your surgery.  You may call to check if you do not hear from [place] after three days. 10. OTHER INSTRUCTIONS: _______________________________________________________________________________________________ _____________________________________________________________________________________________________________________________________ _____________________________________________________________________________________________________________________________________ _____________________________________________________________________________________________________________________________________  WHEN TO CALL DR [facility]: 1. Fever over 101.0 2. Nausea and/or  vomiting. 3. Extreme swelling or bruising. 4. Continued bleeding from incision. 5. Increased pain,  redness, or drainage from the incision.  The clinic staff is available to answer your questions during regular business hours.  Please don't hesitate to call and ask to speak to one of the nurses for clinical concerns.  If you have a medical emergency, go to the nearest emergency room or call 911.  A surgeon from [facility] is always on call at the hospital.  For further questions, please visit [email or web address]    Post Anesthesia Home Care Instructions  Activity: Get plenty of rest for the remainder of the day. A responsible individual must stay with you for 24 hours following the procedure.  For the next 24 hours, DO NOT: -Drive a car -Paediatric nurse -Drink alcoholic beverages -Take any medication unless instructed by your physician -Make any legal decisions or sign important papers.  Meals: Start with liquid foods such as gelatin or soup. Progress to regular foods as tolerated. Avoid greasy, spicy, heavy foods. If nausea and/or vomiting occur, drink only clear liquids until the nausea and/or vomiting subsides. Call your physician if vomiting continues.  Special Instructions/Symptoms: Your throat may feel dry or sore from the anesthesia or the breathing tube placed in your throat during surgery. If this causes discomfort, gargle with warm salt water. The discomfort should disappear within 24 hours.  If you had a scopolamine patch placed behind your ear for the management of post- operative nausea and/or vomiting:  1. The medication in the patch is effective for 72 hours, after which it should be removed.  Wrap patch in a tissue and discard in the trash. Wash hands thoroughly with soap and water. 2. You may remove the patch earlier than 72 hours if you experience unpleasant side effects which may include dry mouth, dizziness or visual disturbances. 3. Avoid  touching the patch. Wash your hands with soap and water after contact with the patch.

## 2019-09-13 ENCOUNTER — Encounter: Payer: Self-pay | Admitting: *Deleted

## 2019-09-13 ENCOUNTER — Telehealth: Payer: Self-pay | Admitting: *Deleted

## 2019-09-13 ENCOUNTER — Other Ambulatory Visit (HOSPITAL_COMMUNITY): Payer: BC Managed Care – PPO

## 2019-09-13 DIAGNOSIS — Z170 Estrogen receptor positive status [ER+]: Secondary | ICD-10-CM

## 2019-09-13 DIAGNOSIS — C50412 Malignant neoplasm of upper-outer quadrant of left female breast: Secondary | ICD-10-CM

## 2019-09-13 LAB — SURGICAL PATHOLOGY

## 2019-09-13 NOTE — Telephone Encounter (Signed)
Called pt to discuss next step is xrt with Dr. Lisbeth Renshaw. Informed pt she will receive a call from his office with an appt date and time. Received verbal understanding. Encourage pt to call with needs.

## 2019-09-17 ENCOUNTER — Encounter: Payer: Self-pay | Admitting: Oncology

## 2019-09-19 ENCOUNTER — Encounter: Payer: Self-pay | Admitting: *Deleted

## 2019-09-26 ENCOUNTER — Telehealth: Payer: Self-pay

## 2019-09-26 NOTE — Telephone Encounter (Signed)
Robin Castillo called our clinic on 09/25/19 requesting to speak with one of our therapists. I returned her phone call same day. She explained she has begun noticing some increased swelling in lateral breast and trunk and she looked up manual lymph drainage online and has been doing this with some success with reduction of fluid. Has also noticed some intermittent swelling in her same side UE. So educated pt that now would be a good time for her to be measured for a compression bra and sleeve/gauntlet, especially since she will be starting radiation soon and this can exacerbate symptoms. She was agreeable to this so script was left at our clinic for pt to pick up at her convenience and to be measured for these at [facility] when she can make appts at each. Pt was agreeable to all and grateful for the advice. Also encouraged her to make a follow up visit with out office if her swelling persists despite compression. She verbalized understanding.

## 2019-09-27 ENCOUNTER — Encounter (HOSPITAL_COMMUNITY): Payer: Self-pay | Admitting: Oncology

## 2019-10-01 ENCOUNTER — Telehealth: Payer: Self-pay | Admitting: Radiation Oncology

## 2019-10-01 NOTE — Telephone Encounter (Signed)
The patient called and LM for me last week with questions about having a covid vaccine in the midst of getting ready for radiotherapy. I called her back today and we discussed that she could have her vaccines in the midst of radiotherapy. We also discussed possible treatment course but that would be further discussed also with Dr. Lisbeth Renshaw next week. She is working with PT with concerns for symptoms of early lymphedema and is avoiding certain activities that could worsen her risk of this. We will see her next week in person to discuss radiotherapy.

## 2019-10-03 NOTE — Progress Notes (Signed)
[place]  Telephone:([phone] Fax:(336) [phone]     ID: Robin Castillo DOB: 12/25/1970  MR#: [id number]  SPQ#:[id number]  Patient Care Team: [name], MD as PCP - General (Internal Medicine) Rockwell Germany, RN as Oncology Nurse Navigator Mauro Kaufmann, RN as Oncology Nurse Navigator Kyung Rudd, MD as Consulting Physician (Radiation Oncology) [name], MD as Consulting Physician (General Surgery) Dominick Morella, Virgie Dad, MD as Consulting Physician (Oncology) Earnstine Regal, PA-C as Consulting Physician (Obstetrics and Gynecology) Belva Crome, MD as Consulting Physician (Cardiology) Chauncey Cruel, MD OTHER MD:  CHIEF COMPLAINT: Invasive ductal breast cancer, estrogen receptor positive  CURRENT TREATMENT: Adjuvant radiation pending   INTERVAL HISTORY: Robin Castillo returns today for follow up of her estrogen receptor positive breast cancer. She was evaluated in the multidisciplinary breast cancer clinic on 08/01/2019.  Since consultation, her genetics testing results returned, which were negative. A variant of uncertain significance was noted in the MSH3 gene.  She opted to proceed with left lumpectomy and sentinel lymph node biopsy on 08/21/2019 under Dr. Donne Hazel. Pathology from the procedure ([id number]) showed: invasive ductal carcinoma, grade 2, measuring 1.2 cm; intermediate grade ductal carcinoma in situ with central necrosis and calcifications; invasive carcinoma focally involved anterior/inferior margin.  The biopsied lymph node was positive for a micrometastasis.  In light of the positive margin, she underwent re-excision on 09/11/2019. Pathology [id number]) confirmed no residual carcinoma.  Oncotype DX was obtained on the final surgical sample and the recurrence score of 16 predicts a risk of recurrence outside the breast over the next 9 years of 15%, if the patient's only systemic therapy is an antiestrogen for 5 years.  It also  predicts no significant benefit from chemotherapy.   REVIEW OF SYSTEMS: Robin Castillo did well with her surgery, but has developed a "cord" in the left chest area.  She saw Dr. Donne Hazel 10/02/2019 and he reassured her that this would resolve on its own.  She finds that heat is helpful.  She is taking some anti-inflammatories.  She is going through physical therapy for the left upper extremity and that is going well.  Mentally she feels anxious.  She tells me she is having periods are very irregular intervals, with the periods apparently coming more frequently lately.  A detailed review of systems today was otherwise stable.   HISTORY OF CURRENT ILLNESS: From the original intake note:  Robin Castillo presented with a palpable left breast lump. She underwent bilateral diagnostic mammography with tomography and left breast ultrasonography at [facility] on 07/25/2019 showing: breast density category B; mass with associated clustered calcifications (overall dimension up to 1.2 cm on mammogram and 0.9 cm on ultrasound) within upper-outer left breast at 1 o'clock; left axillary lymph node appears within normal limits.  Accordingly on 07/26/2019 she proceeded to biopsy of the left breast area in question. The pathology from this procedure ([id number]) showed: invasive ductal carcinoma, grade 2. Prognostic indicators significant for: estrogen receptor, 70% positive with moderate staining intensity and progesterone receptor, 95% positive with strong staining intensity. Proliferation marker Ki67 at 2%. HER2 negative by immunohistochemistry (1+).  The patient's subsequent history is as detailed below.   PAST MEDICAL HISTORY: Past Medical History:  Diagnosis Date  . Arthritis    per patient new onset in finger joints  . BV (bacterial vaginosis) 2012  . Cancer ([place]) 2021   left breast   . Cyst of ovary, right 2003  . Family history of prostate cancer in father   .  GBS carrier   . H/O rubella   . H/O  varicella   . History of PCOS 06/2005  . Hypertension   . Infertility, female   . Irregular menses 11/2001  . Leukorrhea 04/2007   phsiological  . Pregnancy induced hypertension   . Psychosocial stressors 10/2009    PAST SURGICAL HISTORY: Past Surgical History:  Procedure Laterality Date  . BREAST ENHANCEMENT SURGERY    . BREAST LUMPECTOMY WITH RADIOACTIVE SEED AND SENTINEL LYMPH NODE BIOPSY Left 08/21/2019   Procedure: LEFT BREAST LUMPECTOMY WITH RADIOACTIVE SEED AND LEFT AXILLARY SENTINEL LYMPH NODE BIOPSY;  Surgeon: [name], MD;  Location: [place];  Service: General;  Laterality: Left;  . CESAREAN SECTION    . RE-EXCISION OF BREAST LUMPECTOMY Left 09/11/2019   Procedure: LEFT RE-EXCISION OF BREAST MARGIN;  Surgeon: [name], MD;  Location: [place];  Service: General;  Laterality: Left;    FAMILY HISTORY: Family History  Problem Relation Age of Onset  . Heart disease Mother   . Hypertension Mother   . Heart disease Father   . Depression Father   . Alcohol abuse Father   . Prostate cancer Father 69  . Heart disease Maternal Grandmother   . Heart disease Maternal Grandfather    Patient's father is 26 years old and patient's mother 42 years old (as of 07/2019) The patient denies a family hx of breast or ovarian cancer. She does report her father was recently diagnosed with prostate cancer. She has 1 sister.   GYNECOLOGIC HISTORY:  Patient's last menstrual period was 09/10/2019. Menarche: 49 years old Age at first live birth: 49 years old Robin Castillo 2 LMP 07/28/2019, periods are irregular Contraceptive: yes, used for more than 20 years HRT n/a  Hysterectomy? no BSO? 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 Husband Robin Castillo is a self-employed Forensic psychologist. She lives at home with her husband and two sons:Robin Castillo  31 and Robin Castillo. She attends a [employer]  ADVANCED DIRECTIVES: In the  absence of any documentation to the contrary, the patient's spouse is their HCPOA.    HEALTH MAINTENANCE: Social History   Tobacco Use  . Smoking status: Never Smoker  . Smokeless tobacco: Never Used  Substance Use Topics  . Alcohol use: Not Currently    Comment: Glass of champagne once a month  . Drug use: No     Colonoscopy: n/a (age)  PAP: 06/2018  Bone density: n/a (age)   No Known Allergies  Current Outpatient Medications  Medication Sig Dispense Refill  . diclofenac (VOLTAREN) 75 MG EC tablet Take 75 mg by mouth daily as needed for moderate pain.    Marland Kitchen lisinopril-hydrochlorothiazide (PRINZIDE,ZESTORETIC) 10-12.5 MG per tablet Take 1 tablet by mouth daily.    Marland Kitchen oxyCODONE (OXY IR/ROXICODONE) 5 MG immediate release tablet Take 1 tablet (5 mg total) by mouth every 6 (six) hours as needed for moderate pain, severe pain or breakthrough pain. 10 tablet 0   No current facility-administered medications for this visit.    OBJECTIVE:  Native American woman in no acute distress  Vitals:   10/04/19 1341  BP: 132/90  Pulse: 87  Resp: 20  Temp: (!) 97.4 F (36.3 C)  SpO2: 100%     Body mass index is 24.55 kg/m.   Wt Readings from Last 3 Encounters:  10/04/19 143 lb (64.9 kg)  09/11/19 144 lb 6.4 oz (65.5 kg)  08/21/19  143 lb 11.8 oz (65.2 kg)      ECOG FS:1 - Symptomatic but completely ambulatory  Sclerae unicteric, EOMs intact Wearing a mask No cervical or supraclavicular adenopathy Lungs no rales or rhonchi Heart regular rate and rhythm Abd soft, nontender, positive bowel sounds MSK no focal spinal tenderness, no upper extremity lymphedema Neuro: nonfocal, well oriented, appropriate affect Breasts: The right breast is benign.  The left breast is status post recent lumpectomy and reresection.  The cosmetic result is very good.  Both axillae are benign.   LAB RESULTS:  CMP     Component Value Date/Time   NA 140 10/04/2019 1309   K 3.4 (L) 10/04/2019 1309   CL  104 10/04/2019 1309   CO2 29 10/04/2019 1309   GLUCOSE 105 (H) 10/04/2019 1309   BUN 5 (L) 10/04/2019 1309   CREATININE 0.79 10/04/2019 1309   CREATININE 0.77 08/01/2019 1234   CALCIUM 8.9 10/04/2019 1309   PROT 7.0 10/04/2019 1309   ALBUMIN 4.0 10/04/2019 1309   AST 13 (L) 10/04/2019 1309   AST 15 08/01/2019 1234   ALT 13 10/04/2019 1309   ALT 13 08/01/2019 1234   ALKPHOS 61 10/04/2019 1309   BILITOT 0.5 10/04/2019 1309   BILITOT 0.3 08/01/2019 1234   GFRNONAA >60 10/04/2019 1309   GFRNONAA >60 08/01/2019 1234   GFRAA >60 10/04/2019 1309   GFRAA >60 08/01/2019 1234    No results found for: TOTALPROTELP, ALBUMINELP, A1GS, A2GS, BETS, BETA2SER, GAMS, MSPIKE, SPEI  Lab Results  Component Value Date   WBC 3.9 (L) 10/04/2019   NEUTROABS 2.4 10/04/2019   HGB 9.3 (L) 10/04/2019   HCT 31.3 (L) 10/04/2019   MCV 81.3 10/04/2019   PLT 285 10/04/2019    No results found for: LABCA2  No components found for: QJJHER740  No results for input(s): INR in the last 168 hours.  No results found for: LABCA2  No results found for: CXK481  No results found for: EHU314  No results found for: HFW263  No results found for: CA2729  No components found for: HGQUANT  No results found for: CEA1 / No results found for: CEA1   No results found for: AFPTUMOR  No results found for: CHROMOGRNA  No results found for: KPAFRELGTCHN, LAMBDASER, KAPLAMBRATIO (kappa/lambda light chains)  No results found for: HGBA, HGBA2QUANT, HGBFQUANT, HGBSQUAN (Hemoglobinopathy evaluation)   No results found for: LDH  No results found for: IRON, TIBC, IRONPCTSAT (Iron and TIBC)  No results found for: FERRITIN  Urinalysis No results found for: COLORURINE, APPEARANCEUR, LABSPEC, PHURINE, GLUCOSEU, HGBUR, BILIRUBINUR, KETONESUR, PROTEINUR, UROBILINOGEN, NITRITE, LEUKOCYTESUR   STUDIES: No results found.   ELIGIBLE FOR AVAILABLE RESEARCH PROTOCOL: no  ASSESSMENT: 49 y.o. [place] woman  status post left breast upper outer quadrant biopsy 07/26/2019 for a clinical T1b-c N0, stage Ia invasive ductal carcinoma, grade 2, estrogen and progesterone receptor positive, HER-2 not amplified, with an MIB-1 of 2  (1) status post left lumpectomy and sentinel lymph node sampling 08/21/2019 for a pT1c pN1(mic), 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  (a) a single sentinel lymph node was removed  (b) additional surgery 09/11/2019 cleared the margins  (2) Oncotype score of 16 predicts a risk of recurrence outside the breast in the next 9 years of 15% if the patient's only systemic therapy is tamoxifen for 5 years.  It also predicts no apparent benefit from chemotherapy.  (3) adjuvant radiation pending  (4) antiestrogens to follow: consider  goserelin/anastrozole  (5) genetics testing 08/09/2019 through the [facility] Breast Cancer STAT and Common Hereditary Cancers panels found no deleterious mutations in ATM, BRCA1, BRCA2, CDH1, CHEK2, PALB2, PTEN, STK11 and TP53.  APC, ATM, AXIN2, BARD1, BMPR1A, BRCA1, BRCA2, BRIP1, CDH1, CDK4, CDKN2A (p14ARF), CDKN2A (p16INK4a), CHEK2, CTNNA1, DICER1, EPCAM (Deletion/duplication testing only), GREM1 (promoter region deletion/duplication testing only), KIT, MEN1, MLH1, MSH2, MSH3, MSH6, MUTYH, NBN, NF1, NHTL1, PALB2, PDGFRA, PMS2, POLD1, POLE, PTEN, RAD50, RAD51C, RAD51D, RNF43, SDHB, SDHC, SDHD, SMAD4, SMARCA4. STK11, TP53, TSC1, TSC2, and VHL.  The following genes were evaluated for sequence changes only: SDHA and HOXB13 c.251G>A variant only.  (a) A variant of uncertain significance was detected in the MSH3 gene called c.803G>A (Castillo.Arg268Gln). The report date is 08/09/2019.   PLAN: Robin Castillo did very well with her surgery and is ready to proceed with radiation.    Today we reviewed the implications of her Oncotype score in detail.  There has been much discussion recently on the interpretation of Oncotype in women under  50.  There appears to be a small chemotherapy benefit in the Oncotype score 16-25 group.  Most oncologists interpret this as being due to the induction of menopause by chemotherapy in this group of younger women's, as in the entire group women in that Oncotype range derived no significant benefit from chemotherapy.  I think Robin Castillo could expect a further risk reduction than what appears in her Oncotype report if she undergoes goserelin and anastrozole for her antiestrogen treatment as opposed to tamoxifen.  I think this would bring her chance of being alive with no distant disease 9 years from now up from 85% to about 90%.  We discussed some of the common symptoms of menopause, which she already is aware of, and the differences between anastrozole and tamoxifen.  She understands she would not qualify for anastrozole unless she took goserelin.  In short I think she has a good prognosis if she uses tamoxifen but are better one if she does goserelin/anastrozole.  My recommendation is that she give this combination a try.  If after 6 to 8 months she is very comfortable she can proceed to bilateral salpingo-oophorectomy and avoid the discomfort of having to have monthly injections.  On the other hand if after a few months she strongly dislikes the menopausal symptoms all we have to do is stop the goserelin and switch to tamoxifen and ovarian function should resume.  We elected that she will think about this and let us know if she wishes to start.  She understands after the first goserelin dose she is likely to have a heavier period but after the second dose menstruation will stop.  I am making her a return appointment here in about 6 weeks by which time she should be completing radiation .  She knows to call for any other issue that may develop before the next visit.  Total encounter time 40 minutes.Chauncey Cruel, MD   10/04/2019 6:21 PM Medical Oncology and Hematology [facility] [place], [place] [address] Tel. [phone]    Fax. [phone]   This document serves as a record of services personally performed by Lurline Del, MD. It was created on his behalf by Wilburn Mylar, a trained medical scribe. The creation of this record is based on the scribe's personal observations and the provider's statements to them.   Lindie Spruce MD, have reviewed the above documentation for accuracy and completeness, and I agree with  the above.   *Total Encounter Time as defined by the Centers for Medicare and Medicaid Services includes, in addition to the face-to-face time of a patient visit (documented in the note above) non-face-to-face time: obtaining and reviewing outside history, ordering and reviewing medications, tests or procedures, care coordination (communications with other health care professionals or caregivers) and documentation in the medical record.

## 2019-10-04 ENCOUNTER — Other Ambulatory Visit: Payer: Self-pay

## 2019-10-04 ENCOUNTER — Ambulatory Visit: Payer: BC Managed Care – PPO | Attending: General Surgery | Admitting: Physical Therapy

## 2019-10-04 ENCOUNTER — Encounter: Payer: Self-pay | Admitting: Physical Therapy

## 2019-10-04 ENCOUNTER — Inpatient Hospital Stay: Payer: BC Managed Care – PPO | Attending: Hematology and Oncology | Admitting: Oncology

## 2019-10-04 ENCOUNTER — Ambulatory Visit: Payer: BC Managed Care – PPO

## 2019-10-04 ENCOUNTER — Inpatient Hospital Stay: Payer: BC Managed Care – PPO

## 2019-10-04 VITALS — BP 132/90 | HR 87 | Temp 97.4°F | Resp 20 | Ht 64.0 in | Wt 143.0 lb

## 2019-10-04 DIAGNOSIS — Z8249 Family history of ischemic heart disease and other diseases of the circulatory system: Secondary | ICD-10-CM | POA: Diagnosis not present

## 2019-10-04 DIAGNOSIS — R293 Abnormal posture: Secondary | ICD-10-CM

## 2019-10-04 DIAGNOSIS — Z483 Aftercare following surgery for neoplasm: Secondary | ICD-10-CM | POA: Insufficient documentation

## 2019-10-04 DIAGNOSIS — C50412 Malignant neoplasm of upper-outer quadrant of left female breast: Secondary | ICD-10-CM | POA: Insufficient documentation

## 2019-10-04 DIAGNOSIS — Z9189 Other specified personal risk factors, not elsewhere classified: Secondary | ICD-10-CM | POA: Diagnosis present

## 2019-10-04 DIAGNOSIS — Z79899 Other long term (current) drug therapy: Secondary | ICD-10-CM | POA: Insufficient documentation

## 2019-10-04 DIAGNOSIS — Z170 Estrogen receptor positive status [ER+]: Secondary | ICD-10-CM

## 2019-10-04 DIAGNOSIS — Z5111 Encounter for antineoplastic chemotherapy: Secondary | ICD-10-CM | POA: Diagnosis present

## 2019-10-04 DIAGNOSIS — Z818 Family history of other mental and behavioral disorders: Secondary | ICD-10-CM | POA: Diagnosis not present

## 2019-10-04 DIAGNOSIS — Z8042 Family history of malignant neoplasm of prostate: Secondary | ICD-10-CM | POA: Insufficient documentation

## 2019-10-04 DIAGNOSIS — Z811 Family history of alcohol abuse and dependence: Secondary | ICD-10-CM | POA: Diagnosis not present

## 2019-10-04 LAB — COMPREHENSIVE METABOLIC PANEL
ALT: 13 U/L (ref 0–44)
AST: 13 U/L — ABNORMAL LOW (ref 15–41)
Albumin: 4 g/dL (ref 3.5–5.0)
Alkaline Phosphatase: 61 U/L (ref 38–126)
Anion gap: 7 (ref 5–15)
BUN: 5 mg/dL — ABNORMAL LOW (ref 6–20)
CO2: 29 mmol/L (ref 22–32)
Calcium: 8.9 mg/dL (ref 8.9–10.3)
Chloride: 104 mmol/L (ref 98–111)
Creatinine, Ser: 0.79 mg/dL (ref 0.44–1.00)
GFR calc Af Amer: >60 mL/min (ref >60)
GFR calc non Af Amer: >60 mL/min (ref >60)
Glucose, Bld: 105 mg/dL — ABNORMAL HIGH (ref 70–99)
Potassium: 3.4 mmol/L — ABNORMAL LOW (ref 3.5–5.1)
Sodium: 140 mmol/L (ref 135–145)
Total Bilirubin: 0.5 mg/dL (ref 0.3–1.2)
Total Protein: 7 g/dL (ref 6.5–8.1)

## 2019-10-04 LAB — CBC WITH DIFFERENTIAL/PLATELET
Abs Immature Granulocytes: 0.01 10*3/uL (ref 0.00–0.07)
Basophils Absolute: 0 10*3/uL (ref 0.0–0.1)
Basophils Relative: 1 %
Eosinophils Absolute: 0 10*3/uL (ref 0.0–0.5)
Eosinophils Relative: 1 %
HCT: 31.3 % — ABNORMAL LOW (ref 36.0–46.0)
Hemoglobin: 9.3 g/dL — ABNORMAL LOW (ref 12.0–15.0)
Immature Granulocytes: 0 %
Lymphocytes Relative: 30 %
Lymphs Abs: 1.2 10*3/uL (ref 0.7–4.0)
MCH: 24.2 pg — ABNORMAL LOW (ref 26.0–34.0)
MCHC: 29.7 g/dL — ABNORMAL LOW (ref 30.0–36.0)
MCV: 81.3 fL (ref 80.0–100.0)
Monocytes Absolute: 0.4 10*3/uL (ref 0.1–1.0)
Monocytes Relative: 9 %
Neutro Abs: 2.4 10*3/uL (ref 1.7–7.7)
Neutrophils Relative %: 59 %
Platelets: 285 10*3/uL (ref 150–400)
RBC: 3.85 MIL/uL — ABNORMAL LOW (ref 3.87–5.11)
RDW: 14.1 % (ref 11.5–15.5)
WBC: 3.9 10*3/uL — ABNORMAL LOW (ref 4.0–10.5)
nRBC: 0 % (ref 0.0–0.2)

## 2019-10-04 NOTE — Therapy (Signed)
[place], [place], [address] Phone: [phone]   Fax:  [phone]  Physical Therapy Treatment  Patient Details  Name: Robin Castillo MRN: [id number] Date of Birth: 27-Sep-1970 Referring Provider (PT): Dr. [name]   Encounter Date: 10/04/2019  PT End of Session - 10/04/19 1147    Visit Number  3    PT Start Time  1108    PT Stop Time  1145    PT Time Calculation (min)  37 min    Activity Tolerance  Patient tolerated treatment well    Behavior During Therapy  [facility] for tasks assessed/performed       Past Medical History:  Diagnosis Date  . Arthritis    per patient new onset in finger joints  . BV (bacterial vaginosis) 2012  . Cancer ([place]) 2021   left breast   . Cyst of ovary, right 2003  . Family history of prostate cancer in father   . GBS carrier   . H/O rubella   . H/O varicella   . History of PCOS 06/2005  . Hypertension   . Infertility, female   . Irregular menses 11/2001  . Leukorrhea 04/2007   phsiological  . Pregnancy induced hypertension   . Psychosocial stressors 10/2009    Past Surgical History:  Procedure Laterality Date  . BREAST ENHANCEMENT SURGERY    . BREAST LUMPECTOMY WITH RADIOACTIVE SEED AND SENTINEL LYMPH NODE BIOPSY Left 08/21/2019   Procedure: LEFT BREAST LUMPECTOMY WITH RADIOACTIVE SEED AND LEFT AXILLARY SENTINEL LYMPH NODE BIOPSY;  Surgeon: [name], MD;  Location: [place];  Service: General;  Laterality: Left;  . CESAREAN SECTION    . RE-EXCISION OF BREAST LUMPECTOMY Left 09/11/2019   Procedure: LEFT RE-EXCISION OF BREAST MARGIN;  Surgeon: [name], MD;  Location: [place];  Service: General;  Laterality: Left;    There were no vitals filed for this visit.  Subjective Assessment - 10/04/19 1110    Subjective  Patient reports she underwent a left lumpectomy and sentinel node biopsy (1 node removed with  micromets) on 08/21/2019. She is scheduled for a re-excision on 09/11/2019 to obtain clear margins. She received Oncotype results which were 16 so no need for chemo. She saw her surgeon 10/02/2019 and talked ot him about left lateral trunk edema. She has begun her own manual lymph drainage. She wants PT to examine this to reassure her. 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    Pertinent History  Patient was diagnosed on 07/25/2019 with left grade II invasive ductal carcinoma breast cancer. Patient reports she underwent a left lumpectomy and sentinel node biopsy (1 node removed with micromets) on 08/21/2019. It is ER/PR positive and HER2 negative with a Ki67 of 2%.    Patient Stated Goals  Make sure my edema is not lymphedema    Currently in Pain?  No/denies         [facility] PT Assessment - 10/04/19 0001      Assessment   Medical Diagnosis  s/p left lumpectomy and SLNB    Referring Provider (PT)  Dr. [name]    Onset Date/Surgical Date  09/11/19    Hand Dominance  Right      Precautions   Precautions  Other (comment)    Precaution Comments  recent surgery; left lymphedema risk      Restrictions   Weight Bearing Restrictions  No      Balance Screen  Has the patient fallen in the past 6 months  No    Has the patient had a decrease in activity level because of a fear of falling?   No    Is the patient reluctant to leave their home because of a fear of falling?   No      Home Environment   Living Environment  Private residence    Living Arrangements  Children;Spouse/significant other   Husband, 32 and 74 y.o. kids   Available Help at Discharge  Family      Prior Function   Level of Independence  Independent    Vocation  Full time employment    Writer nurse    Leisure  She is walking daily for 1 hour      Cognition   Overall Cognitive Status  Within Functional Limits for tasks assessed      Observation/Other Assessments   Observations   Examined left arm, back, and trunk. There is no visible sign of lymphedema or cording. No pain with palpation, no pitting edema. She does have a mondor's cord inferior to her left breast.      Posture/Postural Control   Posture/Postural Control  Postural limitations    Postural Limitations  Rounded Shoulders;Forward head      ROM / Strength   AROM / PROM / Strength  AROM      Strength   Overall Strength  Within functional limits for tasks performed        LYMPHEDEMA/ONCOLOGY QUESTIONNAIRE - 10/04/19 1124      Type   Cancer Type  Left breast cancer      Right Upper Extremity Lymphedema   10 cm Proximal to Olecranon Process  24.9 cm    Olecranon Process  23.8 cm    10 cm Proximal to Ulnar Styloid Process  22.5 cm    Just Proximal to Ulnar Styloid Process  16.4 cm    Across Hand at [employer]  20.2 cm    At [place] of 2nd Digit  6.5 cm      Left Upper Extremity Lymphedema   10 cm Proximal to Olecranon Process  23.5 cm    Olecranon Process  23 cm    10 cm Proximal to Ulnar Styloid Process  20.7 cm    Just Proximal to Ulnar Styloid Process  16 cm    Across Hand at [employer]  20 cm    At [place] of 2nd Digit  6.4 cm                             PT Long Term Goals - 09/10/19 1415      PT LONG TERM GOAL #1   Title  Patient will demonstrate she has regained full shoulder ROM and function post operatively compared to baselines.    Time  8    Period  Weeks    Status  Achieved            Plan - 10/04/19 1149    Clinical Impression Statement  Patient requeted to be seen today due to concerns of possible lymphedema in her left lateral trunk and arm. She was examined, arms circumferential measurements were taken, and she was assessed for an L-Dex score on the SOZO machine. There were no visible signs of lymphedema. Arm circumferential measurements were normal and in some places, smaller than previously noted. Her L-Dex score was -1.5 which  was well  within normal limits compared to her baseline of -0.8. There is no need for PT at this time and she was encouraged to continue walking and managing her stress. We discussed her anxiety and how that may be contributing to her concerns of new symptoms. She knows ot contact us if she has further concerns. We will continue with monitoring her using SOZO every 3 months.    PT Treatment/Interventions  ADLs/Self Care Home Management;Therapeutic exercise;Patient/family education    PT Next Visit Plan  L-Dex every 3 months    Consulted and Agree with Plan of Care  Patient       Patient will benefit from skilled therapeutic intervention in order to improve the following deficits and impairments:     Visit Diagnosis: Malignant neoplasm of upper-outer quadrant of left breast in female, estrogen receptor positive ([place])  Abnormal posture  Aftercare following surgery for neoplasm  At risk for lymphedema     Problem List Patient Active Problem List   Diagnosis Date Noted  . Genetic testing 08/10/2019  . Family history of prostate cancer in father   . Malignant neoplasm of upper-outer quadrant of left breast in female, estrogen receptor positive ([place]) 07/30/2019   Annia Friendly, PT 10/04/19 11:56 AM  [place], [place], [address] Phone: [phone]   Fax:  [phone]  Name: Shakesha Soltau MRN: [id number] Date of Birth: 11/28/1970

## 2019-10-05 ENCOUNTER — Ambulatory Visit: Payer: BC Managed Care – PPO | Attending: Internal Medicine

## 2019-10-05 DIAGNOSIS — Z23 Encounter for immunization: Secondary | ICD-10-CM

## 2019-10-05 NOTE — Progress Notes (Signed)
   Covid-19 Vaccination Clinic  Name:  Robin Castillo    MRN: [id number] DOB: Aug 01, 1970  10/05/2019  Robin Castillo was observed post Covid-19 immunization for 15 minutes without incident. She was provided with Vaccine Information Sheet and instruction to access the V-Safe system.   Robin Castillo was instructed to call 911 with any severe reactions post vaccine: Marland Kitchen Difficulty breathing  . Swelling of face and throat  . A fast heartbeat  . A bad rash all over body  . Dizziness and weakness   Immunizations Administered    Name Date Dose VIS Date Route   Pfizer COVID-19 Vaccine 10/05/2019  8:24 AM 0.3 mL 06/08/2019 Intramuscular   Manufacturer: [place]   Lot: [id number]   [place]: [id number]

## 2019-10-08 ENCOUNTER — Telehealth: Payer: Self-pay | Admitting: *Deleted

## 2019-10-08 NOTE — Telephone Encounter (Signed)
Received vm call from pt stating that she is ready to start therapy.  Message left for Dr [name].

## 2019-10-09 ENCOUNTER — Telehealth: Payer: Self-pay

## 2019-10-09 ENCOUNTER — Other Ambulatory Visit: Payer: Self-pay | Admitting: Oncology

## 2019-10-09 NOTE — Telephone Encounter (Signed)
Appointment reminder for 10/10/19 patient verbalized she understood this is a in person appointment

## 2019-10-10 ENCOUNTER — Ambulatory Visit
Admission: RE | Admit: 2019-10-10 | Discharge: 2019-10-10 | Disposition: A | Discharge status: Home / Self Care | Payer: BC Managed Care – PPO | Source: Ambulatory Visit | Attending: Radiation Oncology | Admitting: Radiation Oncology

## 2019-10-10 ENCOUNTER — Encounter: Payer: Self-pay | Admitting: Radiation Oncology

## 2019-10-10 ENCOUNTER — Other Ambulatory Visit: Payer: Self-pay

## 2019-10-10 VITALS — BP 143/81 | HR 70 | Temp 98.2°F | Resp 18 | Ht 65.0 in | Wt 141.6 lb

## 2019-10-10 DIAGNOSIS — Z510 Encounter for antineoplastic radiation therapy: Secondary | ICD-10-CM | POA: Insufficient documentation

## 2019-10-10 DIAGNOSIS — Z170 Estrogen receptor positive status [ER+]: Secondary | ICD-10-CM | POA: Insufficient documentation

## 2019-10-10 DIAGNOSIS — C50412 Malignant neoplasm of upper-outer quadrant of left female breast: Secondary | ICD-10-CM

## 2019-10-10 NOTE — Progress Notes (Signed)
Radiation Oncology         [phone] ________________________________  Name: Robin Castillo        MRN: [id number]  Date of Service: 10/10/2019 DOB: Jan 01, 1971  CC:[name], MD  Magrinat, Virgie Dad, MD     REFERRING PHYSICIAN: Magrinat, Virgie Dad, MD   DIAGNOSIS: The encounter diagnosis was Malignant neoplasm of upper-outer quadrant of left breast in female, estrogen receptor positive ([place]).   HISTORY OF PRESENT ILLNESS: Robin Castillo is a 49 y.o. female originally seen in the multidisciplinary breast clinic for a new diagnosis of left breast cancer. The patient was noted to have a palpable mass in the left breast.  She sought evaluation and diagnostic imaging revealed a mass at the 1 o'clock position of the left breast with calcifications the mass measured 1.2 cm, by ultrasound, the mass measured 9 x 8 x 7 mm, her axilla was negative for adenopathy. She did undergo ultrasound-guided biopsy on 07/26/2019 revealing a grade 2 invasive ductal carcinoma that was ER/PR positive, HER-2 was negative and Ki-67 was 2%.  She proceeded with left lumpectomy and sentinel lymph node biopsy on 08/21/2019.  Final pathology revealed a grade 2,  1.2 cm invasive ductal carcinoma with associated intermediate grade DCIS with central necrosis and associated calcifications.  The invasive disease was focally involved at the anterior/inferior margin, the DCIS was less than 1 mm from the anterior inferior and posterior margins.  Her additional posterior margin specimen was benign, additional superior margin was benign, additional lateral margin revealed focal DCIS 2 mm from the new lateral margin, and additional inferior margin was negative.  A sentinel lymph node was notable for micrometastasis in the sampled node. She underwent reexcision on 09/11/2019, her left anterior margin excision was benign with focal fibrocystic change, no residual carcinoma was identified, the left inferior reexcision margin was also benign with  no residual carcinoma.  Oncotype testing was also performed on the original surgical specimen from February and the risk score was 16.  Systemic therapy was not recommended, and she is seen today to discuss adjuvant radiotherapy.    PREVIOUS RADIATION THERAPY: No   PAST MEDICAL HISTORY:  Past Medical History:  Diagnosis Date  . Arthritis    per patient new onset in finger joints  . BV (bacterial vaginosis) 2012  . Cancer ([place]) 2021   left breast   . Cyst of ovary, right 2003  . Family history of prostate cancer in father   . GBS carrier   . H/O rubella   . H/O varicella   . History of PCOS 06/2005  . Hypertension   . Infertility, female   . Irregular menses 11/2001  . Leukorrhea 04/2007   phsiological  . Pregnancy induced hypertension   . Psychosocial stressors 10/2009       PAST SURGICAL HISTORY: Past Surgical History:  Procedure Laterality Date  . BREAST ENHANCEMENT SURGERY    . BREAST LUMPECTOMY WITH RADIOACTIVE SEED AND SENTINEL LYMPH NODE BIOPSY Left 08/21/2019   Procedure: LEFT BREAST LUMPECTOMY WITH RADIOACTIVE SEED AND LEFT AXILLARY SENTINEL LYMPH NODE BIOPSY;  Surgeon: [name], MD;  Location: [facility];  Service: General;  Laterality: Left;  . CESAREAN SECTION    . RE-EXCISION OF BREAST LUMPECTOMY Left 09/11/2019   Procedure: LEFT RE-EXCISION OF BREAST MARGIN;  Surgeon: [name], MD;  Location: [facility];  Service: General;  Laterality: Left;     FAMILY HISTORY:  Family History  Problem Relation Age of Onset  .  Heart disease Mother   . Hypertension Mother   . Heart disease Father   . Depression Father   . Alcohol abuse Father   . Prostate cancer Father 21  . Heart disease Maternal Grandmother   . Heart disease Maternal Grandfather      SOCIAL HISTORY:  reports that she has never smoked. She has never used smokeless tobacco. She reports previous alcohol use. She reports that she does not use drugs. The  patient is married and works as a Merchandiser, retail. The patient has two teenage children.   ALLERGIES: Patient has no known allergies.   MEDICATIONS:  Current Outpatient Medications  Medication Sig Dispense Refill  . diclofenac (VOLTAREN) 75 MG EC tablet Take 75 mg by mouth daily as needed for moderate pain.    Marland Kitchen lisinopril-hydrochlorothiazide (PRINZIDE,ZESTORETIC) 10-12.5 MG per tablet Take 1 tablet by mouth daily.    Marland Kitchen oxyCODONE (OXY IR/ROXICODONE) 5 MG immediate release tablet Take 1 tablet (5 mg total) by mouth every 6 (six) hours as needed for moderate pain, severe pain or breakthrough pain. (Patient not taking: Reported on 10/09/2019) 10 tablet 0   No current facility-administered medications for this encounter.     REVIEW OF SYSTEMS: On review of systems, the patient reports that she is doing well overall. She is really ready to get started with therapy and reports she has a lot of concerns about the treatment overall but knows she needs to do the radiation and antiestrogen. She is contemplating aromatase inhibition with GnRH inhibition as well. She has concerns about this versus just having oophorectomies and also with wanting to avoid lymphedema. No other complaints are verbalized.     PHYSICAL EXAM:  Wt Readings from Last 3 Encounters:  10/04/19 143 lb (64.9 kg)  09/11/19 144 lb 6.4 oz (65.5 kg)  08/21/19 143 lb 11.8 oz (65.2 kg)   Temp Readings from Last 3 Encounters:  10/04/19 (!) 97.4 F (36.3 C) (Temporal)  09/11/19 97.8 F (36.6 C)  08/21/19 (!) 97.5 F (36.4 C)   BP Readings from Last 3 Encounters:  10/04/19 132/90  09/11/19 (!) 153/96  08/21/19 138/87   Pulse Readings from Last 3 Encounters:  10/04/19 87  09/11/19 63  08/21/19 85   In general this is a well appearing African American female in no acute distress.  She's alert and oriented x4 and appropriate throughout the examination. Cardiopulmonary assessment is negative for acute distress and she exhibits  normal effort. Bilateral breast exam is deferred.    ECOG = 0  0 - Asymptomatic (Fully active, able to carry on all predisease activities without restriction)  1 - Symptomatic but completely ambulatory (Restricted in physically strenuous activity but ambulatory and able to carry out work of a light or sedentary nature. For example, light housework, office work)  2 - Symptomatic, <50% in bed during the day (Ambulatory and capable of all self care but unable to carry out any work activities. Up and about more than 50% of waking hours)  3 - Symptomatic, >50% in bed, but not bedbound (Capable of only limited self-care, confined to bed or chair 50% or more of waking hours)  4 - Bedbound (Completely disabled. Cannot carry on any self-care. Totally confined to bed or chair)  5 - Death   Eustace Pen MM, Creech RH, Tormey DC, et al. [phone]). "Toxicity and response criteria of the [facility]". [place] Oncol. 5 (6): 649-55    LABORATORY DATA:  Lab Results  Component  Value Date   WBC 3.9 (L) 10/04/2019   HGB 9.3 (L) 10/04/2019   HCT 31.3 (L) 10/04/2019   MCV 81.3 10/04/2019   PLT 285 10/04/2019   Lab Results  Component Value Date   NA 140 10/04/2019   K 3.4 (L) 10/04/2019   CL 104 10/04/2019   CO2 29 10/04/2019   Lab Results  Component Value Date   ALT 13 10/04/2019   AST 13 (L) 10/04/2019   ALKPHOS 61 10/04/2019   BILITOT 0.5 10/04/2019      RADIOGRAPHY: No results found.     IMPRESSION/PLAN: 1. Stage IA, [id number] grade 2, ER/PR positive invasive ductal carcinoma of the left breast. I met with the patient and we discussed the pathology findings and reviews the nature of left breast disease as well as her micrometastasis. She would be benefited by external radiotherapy to the breast with high tangents to cover the low axilla followed by antiestrogen therapy. We discussed the risks, benefits, short, and long term effects of radiotherapy, and the patient is  interested in proceeding. Dr. MLisbeth Renshawhas reviewed her case and we discussed the delivery and logistics of radiotherapy and anticipates a course of 4- 6 1/2 weeks of radiotherapy with deep inspiration breath hold technique. Upon further discussion, the patient is motivated to consider a hypofractionated course and Dr. MLisbeth Renshawagrees that the 4 week course with high tangents would be an appropriate course. Written consent is obtained and placed in the chart, a copy was provided to the patient. She will proceed later today with simulation. 2. Contraceptive Counseling. The patient is not taking oral contraceptives but is using condoms for contraception and more recently has not been sexually active. She has had recent negative pregnancy testing and declines further testing today.   In a visit lasting 60 minutes, greater than 50% of the time was spent face to face discussing the patient's condition, in preparation for the discussion, and coordinating the patient's care.      ACarola Rhine PAC

## 2019-10-11 ENCOUNTER — Telehealth: Payer: Self-pay | Admitting: Oncology

## 2019-10-11 NOTE — Telephone Encounter (Signed)
Scheduled appt per 4/13 sch msg - pt aware of appts - only set up two and pt will back when she gets insurance figured out with work

## 2019-10-12 ENCOUNTER — Other Ambulatory Visit: Payer: Self-pay

## 2019-10-12 ENCOUNTER — Inpatient Hospital Stay: Payer: BC Managed Care – PPO

## 2019-10-12 VITALS — BP 132/72 | HR 72 | Temp 98.2°F | Resp 18

## 2019-10-12 DIAGNOSIS — Z170 Estrogen receptor positive status [ER+]: Secondary | ICD-10-CM

## 2019-10-12 DIAGNOSIS — C50412 Malignant neoplasm of upper-outer quadrant of left female breast: Secondary | ICD-10-CM

## 2019-10-12 MED ORDER — GOSERELIN ACETATE 3.6 MG ~~LOC~~ IMPL
3.6000 mg | DRUG_IMPLANT | Freq: Once | SUBCUTANEOUS | Status: AC
  Administered 2019-10-12: 3.6 mg via SUBCUTANEOUS

## 2019-10-12 MED ORDER — ANASTROZOLE 1 MG PO TABS: 1.0000 mg | ORAL_TABLET | Freq: Every day | ORAL | 0 refills | Status: DC

## 2019-10-12 MED ORDER — GOSERELIN ACETATE 3.6 MG ~~LOC~~ IMPL
DRUG_IMPLANT | SUBCUTANEOUS | Status: AC
  Filled 2019-10-12: qty 3.6

## 2019-10-12 NOTE — Patient Instructions (Signed)

## 2019-10-15 DIAGNOSIS — C50412 Malignant neoplasm of upper-outer quadrant of left female breast: Secondary | ICD-10-CM | POA: Diagnosis not present

## 2019-10-16 ENCOUNTER — Encounter: Payer: Self-pay | Admitting: *Deleted

## 2019-10-17 ENCOUNTER — Other Ambulatory Visit: Payer: Self-pay

## 2019-10-17 ENCOUNTER — Ambulatory Visit
Admission: RE | Admit: 2019-10-17 | Discharge: 2019-10-17 | Disposition: A | Discharge status: Home / Self Care | Payer: BC Managed Care – PPO | Source: Ambulatory Visit | Attending: Radiation Oncology | Admitting: Radiation Oncology

## 2019-10-17 DIAGNOSIS — C50412 Malignant neoplasm of upper-outer quadrant of left female breast: Secondary | ICD-10-CM | POA: Diagnosis not present

## 2019-10-18 ENCOUNTER — Other Ambulatory Visit: Payer: Self-pay

## 2019-10-18 ENCOUNTER — Ambulatory Visit
Admission: RE | Admit: 2019-10-18 | Discharge: 2019-10-18 | Disposition: A | Discharge status: Home / Self Care | Payer: BC Managed Care – PPO | Source: Ambulatory Visit | Attending: Radiation Oncology | Admitting: Radiation Oncology

## 2019-10-18 DIAGNOSIS — C50412 Malignant neoplasm of upper-outer quadrant of left female breast: Secondary | ICD-10-CM | POA: Diagnosis not present

## 2019-10-19 ENCOUNTER — Ambulatory Visit
Admission: RE | Admit: 2019-10-19 | Discharge: 2019-10-19 | Disposition: A | Discharge status: Home / Self Care | Payer: BC Managed Care – PPO | Source: Ambulatory Visit | Attending: Radiation Oncology | Admitting: Radiation Oncology

## 2019-10-19 ENCOUNTER — Other Ambulatory Visit: Payer: Self-pay

## 2019-10-19 DIAGNOSIS — Z170 Estrogen receptor positive status [ER+]: Secondary | ICD-10-CM

## 2019-10-19 DIAGNOSIS — C50412 Malignant neoplasm of upper-outer quadrant of left female breast: Secondary | ICD-10-CM

## 2019-10-19 NOTE — Progress Notes (Signed)
Pt here for patient teaching.  Pt given Radiation and You booklet, skin care instructions, Alra deodorant and Sonafine.  Reviewed areas of pertinence such as fatigue, hair loss, skin changes, breast tenderness and breast swelling . Pt able to give teach back of to pat skin and use unscented/gentle soap,apply Sonafine bid, avoid applying anything to skin within 4 hours of treatment, avoid wearing an under wire bra and to use an electric razor if they must shave. Pt verbalizes understanding of information given and will contact nursing with any questions or concerns.     [place] Jaquith M. Bazil Dhanani RN, BSN             

## 2019-10-22 ENCOUNTER — Ambulatory Visit
Admission: RE | Admit: 2019-10-22 | Discharge: 2019-10-22 | Disposition: A | Discharge status: Home / Self Care | Payer: BC Managed Care – PPO | Source: Ambulatory Visit | Attending: Radiation Oncology | Admitting: Radiation Oncology

## 2019-10-22 ENCOUNTER — Other Ambulatory Visit: Payer: Self-pay

## 2019-10-22 DIAGNOSIS — C50412 Malignant neoplasm of upper-outer quadrant of left female breast: Secondary | ICD-10-CM | POA: Diagnosis not present

## 2019-10-23 ENCOUNTER — Other Ambulatory Visit: Payer: Self-pay

## 2019-10-23 ENCOUNTER — Ambulatory Visit
Admission: RE | Admit: 2019-10-23 | Discharge: 2019-10-23 | Disposition: A | Discharge status: Home / Self Care | Payer: BC Managed Care – PPO | Source: Ambulatory Visit | Attending: Radiation Oncology | Admitting: Radiation Oncology

## 2019-10-23 DIAGNOSIS — C50412 Malignant neoplasm of upper-outer quadrant of left female breast: Secondary | ICD-10-CM | POA: Diagnosis not present

## 2019-10-24 ENCOUNTER — Ambulatory Visit
Admission: RE | Admit: 2019-10-24 | Discharge: 2019-10-24 | Disposition: A | Discharge status: Home / Self Care | Payer: BC Managed Care – PPO | Source: Ambulatory Visit | Attending: Radiation Oncology | Admitting: Radiation Oncology

## 2019-10-24 ENCOUNTER — Other Ambulatory Visit: Payer: Self-pay

## 2019-10-24 DIAGNOSIS — C50412 Malignant neoplasm of upper-outer quadrant of left female breast: Secondary | ICD-10-CM | POA: Diagnosis not present

## 2019-10-25 ENCOUNTER — Ambulatory Visit
Admission: RE | Admit: 2019-10-25 | Discharge: 2019-10-25 | Disposition: A | Discharge status: Home / Self Care | Payer: BC Managed Care – PPO | Source: Ambulatory Visit | Attending: Radiation Oncology | Admitting: Radiation Oncology

## 2019-10-25 ENCOUNTER — Other Ambulatory Visit: Payer: Self-pay

## 2019-10-25 DIAGNOSIS — C50412 Malignant neoplasm of upper-outer quadrant of left female breast: Secondary | ICD-10-CM | POA: Diagnosis not present

## 2019-10-25 DIAGNOSIS — Z170 Estrogen receptor positive status [ER+]: Secondary | ICD-10-CM

## 2019-10-25 MED ORDER — SONAFINE EX EMUL
1.0000 "application " | Freq: Once | CUTANEOUS | Status: AC
  Administered 2019-10-25: 1 via TOPICAL

## 2019-10-25 MED ORDER — ALRA NON-METALLIC DEODORANT (RAD-ONC)
1.0000 "application " | Freq: Once | TOPICAL | Status: AC
  Administered 2019-10-25: 1 via TOPICAL

## 2019-10-25 NOTE — Progress Notes (Signed)
Pt here for patient teaching.  Pt given Radiation and You booklet, skin care instructions, Alra deodorant and Sonafine.  Reviewed areas of pertinence such as fatigue, hair loss, skin changes, breast tenderness and breast swelling . Pt able to give teach back of to pat skin and use unscented/gentle soap,apply Sonafine bid, avoid applying anything to skin within 4 hours of treatment, avoid wearing an under wire bra and to use an electric razor if they must shave. Pt verbalizes understanding of information given and will contact nursing with any questions or concerns.     Huey Scalia M. Teghan Philbin RN, BSN             

## 2019-10-26 ENCOUNTER — Ambulatory Visit
Admission: RE | Admit: 2019-10-26 | Discharge: 2019-10-26 | Disposition: A | Discharge status: Home / Self Care | Payer: BC Managed Care – PPO | Source: Ambulatory Visit | Attending: Radiation Oncology | Admitting: Radiation Oncology

## 2019-10-26 ENCOUNTER — Other Ambulatory Visit: Payer: Self-pay

## 2019-10-26 ENCOUNTER — Other Ambulatory Visit: Payer: Self-pay | Admitting: Radiation Oncology

## 2019-10-26 DIAGNOSIS — C50412 Malignant neoplasm of upper-outer quadrant of left female breast: Secondary | ICD-10-CM

## 2019-10-26 DIAGNOSIS — Z170 Estrogen receptor positive status [ER+]: Secondary | ICD-10-CM

## 2019-10-29 ENCOUNTER — Ambulatory Visit
Admission: RE | Admit: 2019-10-29 | Discharge: 2019-10-29 | Disposition: A | Discharge status: Home / Self Care | Payer: BC Managed Care – PPO | Source: Ambulatory Visit | Attending: Radiation Oncology | Admitting: Radiation Oncology

## 2019-10-29 ENCOUNTER — Encounter: Payer: Self-pay | Admitting: Physical Therapy

## 2019-10-29 ENCOUNTER — Other Ambulatory Visit: Payer: Self-pay

## 2019-10-29 ENCOUNTER — Ambulatory Visit: Payer: BC Managed Care – PPO | Attending: General Surgery | Admitting: Physical Therapy

## 2019-10-29 DIAGNOSIS — Z170 Estrogen receptor positive status [ER+]: Secondary | ICD-10-CM

## 2019-10-29 DIAGNOSIS — Z510 Encounter for antineoplastic radiation therapy: Secondary | ICD-10-CM | POA: Insufficient documentation

## 2019-10-29 DIAGNOSIS — Z483 Aftercare following surgery for neoplasm: Secondary | ICD-10-CM

## 2019-10-29 DIAGNOSIS — R600 Localized edema: Secondary | ICD-10-CM | POA: Insufficient documentation

## 2019-10-29 DIAGNOSIS — C50412 Malignant neoplasm of upper-outer quadrant of left female breast: Secondary | ICD-10-CM | POA: Insufficient documentation

## 2019-10-29 DIAGNOSIS — R293 Abnormal posture: Secondary | ICD-10-CM | POA: Insufficient documentation

## 2019-10-29 DIAGNOSIS — Z9189 Other specified personal risk factors, not elsewhere classified: Secondary | ICD-10-CM | POA: Insufficient documentation

## 2019-10-29 NOTE — Therapy (Signed)
[facility] [place], [place], [address] Phone: [phone]   Fax:  [phone]  Physical Therapy Evaluation  Patient Details  Name: Robin Castillo MRN: [id number] Date of Birth: 19-Jul-1970 Referring Provider (PT): [name], PA-C   Encounter Date: 10/29/2019  PT End of Session - 10/29/19 1207    Visit Number  4    Number of Visits  15    Date for PT Re-Evaluation  11/26/19    PT Start Time  1100    PT Stop Time  1156    PT Time Calculation (min)  56 min    Activity Tolerance  Patient tolerated treatment well    Behavior During Therapy  [facility] for tasks assessed/performed       Past Medical History:  Diagnosis Date  . Arthritis    per patient new onset in finger joints  . BV (bacterial vaginosis) 2012  . Cancer ([place]) 2021   left breast   . Cyst of ovary, right 2003  . Family history of prostate cancer in father   . GBS carrier   . H/O rubella   . H/O varicella   . History of PCOS 06/2005  . Hypertension   . Infertility, female   . Irregular menses 11/2001  . Leukorrhea 04/2007   phsiological  . Pregnancy induced hypertension   . Psychosocial stressors 10/2009    Past Surgical History:  Procedure Laterality Date  . BREAST ENHANCEMENT SURGERY    . BREAST LUMPECTOMY WITH RADIOACTIVE SEED AND SENTINEL LYMPH NODE BIOPSY Left 08/21/2019   Procedure: LEFT BREAST LUMPECTOMY WITH RADIOACTIVE SEED AND LEFT AXILLARY SENTINEL LYMPH NODE BIOPSY;  Surgeon: [name], MD;  Location: [facility];  Service: General;  Laterality: Left;  . CESAREAN SECTION    . RE-EXCISION OF BREAST LUMPECTOMY Left 09/11/2019   Procedure: LEFT RE-EXCISION OF BREAST MARGIN;  Surgeon: [name], MD;  Location: [facility];  Service: General;  Laterality: Left;    There were no vitals filed for this visit.   Subjective Assessment - 10/29/19 1107    Subjective  Patient underwent a left  lumpectomy and SLNB on 08/21/2019. She has had 9 radiation treatments and has 11 more to go. She began having left breast swelling about 1 week ago. She is otherwise doing well.    Pertinent History  Patient was diagnosed on 07/25/2019 with left grade II invasive ductal carcinoma breast cancer. Patient reports she underwent a left lumpectomy and sentinel node biopsy (1 node removed with micromets) on 08/21/2019. It is ER/PR positive and HER2 negative with a Ki67 of 2%.    Patient Stated Goals  Help get rid of left breast swelling.    Currently in Pain?  No/denies         [facility] PT Assessment - 10/29/19 0001      Assessment   Medical Diagnosis  lymphedema left breast    Referring Provider (PT)  [name], PA-C    Onset Date/Surgical Date  10/19/19    Hand Dominance  Right    Prior Therapy  Yes for edema concerns      Precautions   Precautions  Other (comment)    Precaution Comments  current radiation; left lymphedema risk      Restrictions   Weight Bearing Restrictions  No      Balance Screen   Has the patient fallen in the past 6 months  No    Has the patient had a  decrease in activity level because of a fear of falling?   No    Is the patient reluctant to leave their home because of a fear of falling?   No      Home Environment   Living Environment  Private residence    Living Arrangements  Children;Spouse/significant other   Husband, 21 and 28 y.o. kids   Available Help at Discharge  Family      Prior Function   Level of Independence  Independent    Vocation  Full time employment    Writer nurse    Leisure  She is walking daily for 1 hour      Cognition   Overall Cognitive Status  Within Functional Limits for tasks assessed      Observation/Other Assessments   Observations  Edema present left superior breast and lateral trunk just inferior to her left axilla.      Posture/Postural Control   Posture/Postural Control  Postural limitations     Postural Limitations  Rounded Shoulders;Forward head      ROM / Strength   AROM / PROM / Strength  AROM      AROM   AROM Assessment Site  Shoulder    Right/Left Shoulder  Left    Left Shoulder Extension  66 Degrees    Left Shoulder Flexion  147 Degrees    Left Shoulder ABduction  170 Degrees    Left Shoulder Internal Rotation  76 Degrees    Left Shoulder External Rotation  87 Degrees      Strength   Overall Strength  Within functional limits for tasks performed        LYMPHEDEMA/ONCOLOGY QUESTIONNAIRE - 10/29/19 1113      Type   Cancer Type  Left breast cancer      Surgeries   Lumpectomy Date  08/21/19    Sentinel Lymph Node Biopsy Date  08/21/19    Other Surgery Date  09/11/19    Number Lymph Nodes Removed  1      Treatment   Active Chemotherapy Treatment  No    Past Chemotherapy Treatment  No    Active Radiation Treatment  Yes    Date  10/17/19    Body Site  left breast    Past Radiation Treatment  No    Current Hormone Treatment  No    Past Hormone Therapy  No      What other symptoms do you have   Are you Having Heaviness or Tightness  No    Are you having Pain  Yes    Are you having pitting edema  No    Is it Hard or Difficult finding clothes that fit  No    Do you have infections  No    Is there Decreased scar mobility  No    Stemmer Sign  No             Objective measurements completed on examination: See above findings.      [facility] PT Treatment/Exercise - 10/29/19 0001      Manual Therapy   Manual Therapy  Manual Lymphatic Drainage (MLD)    Manual Lymphatic Drainage (MLD)  Manual lymph drainage to left upper quadrant in supine: short neck, right axillary and left inguinal nodes, anterior inter-axillary and left axillo-inguinal pathways; left upper arm redirecting along pathways and left breast focused on superior aspect (no redness present on skin from radiation) redirecting along pathways.  PT Education - 10/29/19  1206    Education Details  Self-manual lymph drainage instructed verbally as PT performed treatment    Person(s) Educated  Patient    Methods  Explanation;Demonstration;Handout    Comprehension  Verbalized understanding;Need further instruction          PT Long Term Goals - 10/29/19 1210      PT LONG TERM GOAL #1   Title  Patient will be independent with performing self manual lymph drainage for left breast to reduce swelling.    Baseline  No knowledge    Time  4    Period  Weeks    Status  New    Target Date  11/26/19      PT LONG TERM GOAL #2   Title  Patient will report >/= 50% less discomfort in her left breast after performing manual lymph drainage.    Time  4    Period  Weeks    Status  New    Target Date  11/26/19             Plan - 10/29/19 1207    Clinical Impression Statement  Patient was seen today per hre request due to concerns of left breast swelling. Her radiation oncologist was contacted and a referral was placed. She had another L-dex screening performed today for her piece of mind and was found to be very close to baseline, indicating no concern of left arm lymphedema. Her left breast does appear to have some edema present, likely due to radiation. She will benefit from manual lymph drainage to reduce left breast tightness and edema.    Stability/Clinical Decision Making  Stable/Uncomplicated    Clinical Decision Making  Low    Rehab Potential  Excellent    PT Frequency  3x / week    PT Duration  4 weeks    PT Treatment/Interventions  ADLs/Self Care Home Management;Therapeutic exercise;Patient/family education;Manual techniques;Manual lymph drainage;Scar mobilization    PT Next Visit Plan  L-Dex every 3 months; manual lymph drainage left breast and lateral trunk and instruct pt's husband    PT Home Exercise Plan  Issued self manual lymph drainage handout    Consulted and Agree with Plan of Care  Patient       Patient will benefit from skilled  therapeutic intervention in order to improve the following deficits and impairments:  Postural dysfunction, Decreased range of motion, Pain, Impaired UE functional use, Decreased knowledge of precautions, Increased edema  Visit Diagnosis: Malignant neoplasm of upper-outer quadrant of left breast in female, estrogen receptor positive ([place]) - Plan: PT plan of care cert/re-cert  Abnormal posture - Plan: PT plan of care cert/re-cert  Aftercare following surgery for neoplasm - Plan: PT plan of care cert/re-cert  Localized edema - Plan: PT plan of care cert/re-cert     Problem List Patient Active Problem List   Diagnosis Date Noted  . Genetic testing 08/10/2019  . Family history of prostate cancer in father   . Malignant neoplasm of upper-outer quadrant of left breast in female, estrogen receptor positive ([place]) 07/30/2019   Annia Friendly, PT 10/29/19 12:13 PM  [place], [place], [address] Phone: [phone]   Fax:  [phone]  Name: Emmamae Mcnamara MRN: [id number] Date of Birth: June 02, 1971

## 2019-10-29 NOTE — Patient Instructions (Signed)

## 2019-10-30 ENCOUNTER — Other Ambulatory Visit: Payer: Self-pay

## 2019-10-30 ENCOUNTER — Ambulatory Visit
Admission: RE | Admit: 2019-10-30 | Discharge: 2019-10-30 | Disposition: A | Discharge status: Home / Self Care | Payer: BC Managed Care – PPO | Source: Ambulatory Visit | Attending: Radiation Oncology | Admitting: Radiation Oncology

## 2019-10-30 DIAGNOSIS — C50412 Malignant neoplasm of upper-outer quadrant of left female breast: Secondary | ICD-10-CM | POA: Diagnosis not present

## 2019-10-31 ENCOUNTER — Ambulatory Visit: Payer: BC Managed Care – PPO | Attending: Internal Medicine

## 2019-10-31 ENCOUNTER — Ambulatory Visit: Payer: BC Managed Care – PPO

## 2019-10-31 ENCOUNTER — Other Ambulatory Visit: Payer: Self-pay

## 2019-10-31 ENCOUNTER — Ambulatory Visit
Admission: RE | Admit: 2019-10-31 | Discharge: 2019-10-31 | Disposition: A | Discharge status: Home / Self Care | Payer: BC Managed Care – PPO | Source: Ambulatory Visit | Attending: Radiation Oncology | Admitting: Radiation Oncology

## 2019-10-31 DIAGNOSIS — Z170 Estrogen receptor positive status [ER+]: Secondary | ICD-10-CM

## 2019-10-31 DIAGNOSIS — C50412 Malignant neoplasm of upper-outer quadrant of left female breast: Secondary | ICD-10-CM

## 2019-10-31 DIAGNOSIS — Z9189 Other specified personal risk factors, not elsewhere classified: Secondary | ICD-10-CM

## 2019-10-31 DIAGNOSIS — Z483 Aftercare following surgery for neoplasm: Secondary | ICD-10-CM

## 2019-10-31 DIAGNOSIS — R293 Abnormal posture: Secondary | ICD-10-CM

## 2019-10-31 DIAGNOSIS — R600 Localized edema: Secondary | ICD-10-CM

## 2019-10-31 DIAGNOSIS — Z23 Encounter for immunization: Secondary | ICD-10-CM

## 2019-10-31 NOTE — Progress Notes (Signed)
   Covid-19 Vaccination Clinic  Name:  Rethel Wehe    MRN: [id number] DOB: 21-Jul-1970  10/31/2019  Ms. Scow was observed post Covid-19 immunization for 15 minutes without incident. She was provided with Vaccine Information Sheet and instruction to access the V-Safe system.   Ms. Cadogan was instructed to call 911 with any severe reactions post vaccine: Marland Kitchen Difficulty breathing  . Swelling of face and throat  . A fast heartbeat  . A bad rash all over body  . Dizziness and weakness   Immunizations Administered    Name Date Dose VIS Date Route   Pfizer COVID-19 Vaccine 10/31/2019  4:37 PM 0.3 mL 08/22/2018 Intramuscular   Manufacturer: [place]   Lot: [id number]   [place]: [id number]

## 2019-10-31 NOTE — Therapy (Signed)
[place], [place], [address] Phone: [phone]   Fax:  [phone]  Physical Therapy Treatment  Patient Details  Name: Robin Castillo MRN: [id number] Date of Birth: [birth year] Referring Provider (PT): [name], PA-C   Encounter Date: 10/31/2019  PT End of Session - 10/31/19 1107    Visit Number  5    Number of Visits  15    Date for PT Re-Evaluation  11/26/19    PT Start Time  1006    PT Stop Time  1105    PT Time Calculation (min)  59 min    Activity Tolerance  Patient tolerated treatment well    Behavior During Therapy  [facility] for tasks assessed/performed       Past Medical History:  Diagnosis Date  . Arthritis    per patient new onset in finger joints  . BV (bacterial vaginosis) 2012  . Cancer ([place]) 2021   left breast   . Cyst of ovary, right 2003  . Family history of prostate cancer in father   . GBS carrier   . H/O rubella   . H/O varicella   . History of PCOS 06/2005  . Hypertension   . Infertility, female   . Irregular menses 11/2001  . Leukorrhea 04/2007   phsiological  . Pregnancy induced hypertension   . Psychosocial stressors 10/2009    Past Surgical History:  Procedure Laterality Date  . BREAST ENHANCEMENT SURGERY    . BREAST LUMPECTOMY WITH RADIOACTIVE SEED AND SENTINEL LYMPH NODE BIOPSY Left 08/21/2019   Procedure: LEFT BREAST LUMPECTOMY WITH RADIOACTIVE SEED AND LEFT AXILLARY SENTINEL LYMPH NODE BIOPSY;  Surgeon: [name], MD;  Location: [place];  Service: General;  Laterality: Left;  . CESAREAN SECTION    . RE-EXCISION OF BREAST LUMPECTOMY Left 09/11/2019   Procedure: LEFT RE-EXCISION OF BREAST MARGIN;  Surgeon: [name], MD;  Location: [place];  Service: General;  Laterality: Left;    There were no vitals filed for this visit.  Subjective Assessment - 10/31/19 1012    Subjective  I just left radiation so I  still have some of the cream on. I just want my breast to feel better!    Pertinent History  Patient was diagnosed on 07/25/2019 with left grade II invasive ductal carcinoma breast cancer. Patient reports she underwent a left lumpectomy and sentinel node biopsy (1 node removed with micromets) on 08/21/2019. It is ER/PR positive and HER2 negative with a Ki67 of 2%.    Patient Stated Goals  Help get rid of left breast swelling.    Currently in Pain?  No/denies                       [facility] Adult PT Treatment/Exercise - 10/31/19 0001      Manual Therapy   Manual Therapy  Myofascial release;Manual Lymphatic Drainage (MLD)    Myofascial Release  To Lt axilla in end flexion and abduction and to SLNB incision where scar tissue palpable and pt reports tenderness    Manual Lymphatic Drainage (MLD)  Manual lymph drainage to left upper quadrant in supine: short neck, right axillary and left inguinal nodes, anterior inter-axillary and left axillo-inguinal pathways; left upper arm redirecting along pathways and left breast focused on superior aspect (no redness present on skin from radiation) redirecting along pathways, then into Rt S/L for posterior inter-axillary and cont Lt axillo-inguinal anastomosis and focusing on latera breast/trunk,  then finished retracing pathways in supine.                  PT Long Term Goals - 10/29/19 1210      PT LONG TERM GOAL #1   Title  Patient will be independent with performing self manual lymph drainage for left breast to reduce swelling.    Baseline  No knowledge    Time  4    Period  Weeks    Status  New    Target Date  11/26/19      PT LONG TERM GOAL #2   Title  Patient will report >/= 50% less discomfort in her left breast after performing manual lymph drainage.    Time  4    Period  Weeks    Status  New    Target Date  11/26/19            Plan - 10/31/19 1107    Clinical Impression Statement  Continued with manaul lymph  drainage of Lt breast in supine and S/L. Also incorporated myofascial release to Lt axilla and SLNB incision where pt reports tenderness. Some scar tissue palpable here that softened  with manual therapy. Pt reports good relief of symptoms by end of session.    Stability/Clinical Decision Making  Stable/Uncomplicated    Rehab Potential  Excellent    PT Frequency  3x / week    PT Duration  4 weeks    PT Treatment/Interventions  ADLs/Self Care Home Management;Therapeutic exercise;Patient/family education;Manual techniques;Manual lymph drainage;Scar mobilization    PT Next Visit Plan  L-Dex every 3 months; manual lymph drainage left breast and lateral trunk and instruct pt's husband; instruct pt and have her return demo of MLD    PT Home Exercise Plan  Issued self manual lymph drainage handout    Consulted and Agree with Plan of Care  Patient       Patient will benefit from skilled therapeutic intervention in order to improve the following deficits and impairments:  Postural dysfunction, Decreased range of motion, Pain, Impaired UE functional use, Decreased knowledge of precautions, Increased edema  Visit Diagnosis: Malignant neoplasm of upper-outer quadrant of left breast in female, estrogen receptor positive (HCC)  Abnormal posture  Aftercare following surgery for neoplasm  Localized edema  At risk for lymphedema     Problem List Patient Active Problem List   Diagnosis Date Noted  . Genetic testing 08/10/2019  . Family history of prostate cancer in father   . Malignant neoplasm of upper-outer quadrant of left breast in female, estrogen receptor positive ([place]) 07/30/2019    [name], PTA 10/31/2019, 11:11 AM  [place], [place], [address] Phone: [phone]   Fax:  [phone]  Name: Robin Castillo MRN: [id number] Date of Birth: 1971-06-26

## 2019-11-01 ENCOUNTER — Other Ambulatory Visit: Payer: Self-pay

## 2019-11-01 ENCOUNTER — Ambulatory Visit
Admission: RE | Admit: 2019-11-01 | Discharge: 2019-11-01 | Disposition: A | Discharge status: Home / Self Care | Payer: BC Managed Care – PPO | Source: Ambulatory Visit | Attending: Radiation Oncology | Admitting: Radiation Oncology

## 2019-11-01 DIAGNOSIS — C50412 Malignant neoplasm of upper-outer quadrant of left female breast: Secondary | ICD-10-CM | POA: Diagnosis not present

## 2019-11-02 ENCOUNTER — Ambulatory Visit
Admission: RE | Admit: 2019-11-02 | Discharge: 2019-11-02 | Disposition: A | Discharge status: Home / Self Care | Payer: BC Managed Care – PPO | Source: Ambulatory Visit | Attending: Radiation Oncology | Admitting: Radiation Oncology

## 2019-11-02 ENCOUNTER — Other Ambulatory Visit: Payer: Self-pay

## 2019-11-02 DIAGNOSIS — C50412 Malignant neoplasm of upper-outer quadrant of left female breast: Secondary | ICD-10-CM | POA: Diagnosis not present

## 2019-11-05 ENCOUNTER — Other Ambulatory Visit: Payer: Self-pay

## 2019-11-05 ENCOUNTER — Other Ambulatory Visit: Payer: Self-pay | Admitting: *Deleted

## 2019-11-05 ENCOUNTER — Ambulatory Visit
Admission: RE | Admit: 2019-11-05 | Discharge: 2019-11-05 | Disposition: A | Discharge status: Home / Self Care | Payer: BC Managed Care – PPO | Source: Ambulatory Visit | Attending: Radiation Oncology | Admitting: Radiation Oncology

## 2019-11-05 DIAGNOSIS — C50412 Malignant neoplasm of upper-outer quadrant of left female breast: Secondary | ICD-10-CM | POA: Diagnosis not present

## 2019-11-05 DIAGNOSIS — Z170 Estrogen receptor positive status [ER+]: Secondary | ICD-10-CM

## 2019-11-05 MED ORDER — ALRA NON-METALLIC DEODORANT (RAD-ONC)
1.0000 "application " | Freq: Once | TOPICAL | Status: AC
  Administered 2019-11-05: 1 via TOPICAL

## 2019-11-05 MED ORDER — LIDOCAINE-PRILOCAINE 2.5-2.5 % EX CREA
1.0000 | TOPICAL_CREAM | CUTANEOUS | 0 refills | Status: DC | PRN
Start: 2019-11-05 — End: 2020-03-15

## 2019-11-05 MED ORDER — ANASTROZOLE 1 MG PO TABS: 1.0000 mg | ORAL_TABLET | Freq: Every day | ORAL | 0 refills | Status: DC

## 2019-11-06 ENCOUNTER — Ambulatory Visit
Admission: RE | Admit: 2019-11-06 | Discharge: 2019-11-06 | Disposition: A | Discharge status: Home / Self Care | Payer: BC Managed Care – PPO | Source: Ambulatory Visit | Attending: Radiation Oncology | Admitting: Radiation Oncology

## 2019-11-06 ENCOUNTER — Other Ambulatory Visit: Payer: Self-pay

## 2019-11-06 DIAGNOSIS — C50412 Malignant neoplasm of upper-outer quadrant of left female breast: Secondary | ICD-10-CM | POA: Diagnosis not present

## 2019-11-07 ENCOUNTER — Ambulatory Visit
Admission: RE | Admit: 2019-11-07 | Discharge: 2019-11-07 | Disposition: A | Discharge status: Home / Self Care | Payer: BC Managed Care – PPO | Source: Ambulatory Visit | Attending: Radiation Oncology | Admitting: Radiation Oncology

## 2019-11-07 ENCOUNTER — Ambulatory Visit: Payer: BC Managed Care – PPO

## 2019-11-07 ENCOUNTER — Other Ambulatory Visit: Payer: Self-pay

## 2019-11-07 DIAGNOSIS — Z9189 Other specified personal risk factors, not elsewhere classified: Secondary | ICD-10-CM

## 2019-11-07 DIAGNOSIS — R600 Localized edema: Secondary | ICD-10-CM

## 2019-11-07 DIAGNOSIS — C50412 Malignant neoplasm of upper-outer quadrant of left female breast: Secondary | ICD-10-CM | POA: Diagnosis not present

## 2019-11-07 DIAGNOSIS — Z170 Estrogen receptor positive status [ER+]: Secondary | ICD-10-CM

## 2019-11-07 DIAGNOSIS — R293 Abnormal posture: Secondary | ICD-10-CM

## 2019-11-07 DIAGNOSIS — Z483 Aftercare following surgery for neoplasm: Secondary | ICD-10-CM

## 2019-11-07 NOTE — Therapy (Signed)
[place], [place], [address] Phone: [phone]   Fax:  [phone]  Physical Therapy Treatment  Patient Details  Name: Kriti Katayama MRN: [id number] Date of Birth: [birth year] Referring Provider (PT): [name], PA-C   Encounter Date: 11/07/2019  PT End of Session - 11/07/19 1208    Visit Number  6    Number of Visits  15    Date for PT Re-Evaluation  11/26/19    PT Start Time  1108    PT Stop Time  1205    PT Time Calculation (min)  57 min    Activity Tolerance  Patient tolerated treatment well    Behavior During Therapy  [facility] for tasks assessed/performed       Past Medical History:  Diagnosis Date  . Arthritis    per patient new onset in finger joints  . BV (bacterial vaginosis) 2012  . Cancer ([place]) 2021   left breast   . Cyst of ovary, right 2003  . Family history of prostate cancer in father   . GBS carrier   . H/O rubella   . H/O varicella   . History of PCOS 06/2005  . Hypertension   . Infertility, female   . Irregular menses 11/2001  . Leukorrhea 04/2007   phsiological  . Pregnancy induced hypertension   . Psychosocial stressors 10/2009    Past Surgical History:  Procedure Laterality Date  . BREAST ENHANCEMENT SURGERY    . BREAST LUMPECTOMY WITH RADIOACTIVE SEED AND SENTINEL LYMPH NODE BIOPSY Left 08/21/2019   Procedure: LEFT BREAST LUMPECTOMY WITH RADIOACTIVE SEED AND LEFT AXILLARY SENTINEL LYMPH NODE BIOPSY;  Surgeon: [name], MD;  Location: [place];  Service: General;  Laterality: Left;  . CESAREAN SECTION    . RE-EXCISION OF BREAST LUMPECTOMY Left 09/11/2019   Procedure: LEFT RE-EXCISION OF BREAST MARGIN;  Surgeon: [name], MD;  Location: [place];  Service: General;  Laterality: Left;    There were no vitals filed for this visit.  Subjective Assessment - 11/07/19 1115    Subjective  My skin is starting to feel  sensitive but no openings. I'm so done with radiation. My last 4 treatments are boost.    Pertinent History  Patient was diagnosed on 07/25/2019 with left grade II invasive ductal carcinoma breast cancer. Patient reports she underwent a left lumpectomy and sentinel node biopsy (1 node removed with micromets) on 08/21/2019. It is ER/PR positive and HER2 negative with a Ki67 of 2%.    Patient Stated Goals  Help get rid of left breast swelling.    Currently in Pain?  No/denies                        [facility] Adult PT Treatment/Exercise - 11/07/19 0001      Manual Therapy   Manual Therapy  Myofascial release;Manual Lymphatic Drainage (MLD);Passive ROM    Myofascial Release  To Lt axilla in end flexion, abduction, and D2 and to SLNB incision where scar tissue palpable and pt reports tenderness    Manual Lymphatic Drainage (MLD)  Manual lymph drainage to left upper quadrant in supine: short neck, right axillary and left inguinal nodes, anterior inter-axillary and left axillo-inguinal pathways; left upper arm redirecting along pathways and left breast focused on superior aspect (min redness present on skin from radiation but pt repotrs no skin pulling felt) redirecting along pathways    Passive ROM  In  Supine to Lt shoulder into end ranges of flexion, abduction and D2 with MFR during; also scapular depression at superior shoulder before each stetch                  PT Long Term Goals - 10/29/19 1210      PT LONG TERM GOAL #1   Title  Patient will be independent with performing self manual lymph drainage for left breast to reduce swelling.    Baseline  No knowledge    Time  4    Period  Weeks    Status  New    Target Date  11/26/19      PT LONG TERM GOAL #2   Title  Patient will report >/= 50% less discomfort in her left breast after performing manual lymph drainage.    Time  4    Period  Weeks    Status  New    Target Date  11/26/19            Plan - 11/07/19  1213    Clinical Impression Statement  Continued with manual lymph drainage of Lt breast and manual therapy of Lt shoulder. Pts superior breast swelling was some improved today as she reports she has been focusing on stretching into end ROM and self MLD. She is also wearing compression bras to further aid in decrease swelling as she is currently undergoing radiation (has 4 more treatments and these will be boost). Her skin remains intact, but is slightly darkened from last session and pt reports beginning to itch.She continues to report good relief from symptoms of tightness and swelling during and after session.    Stability/Clinical Decision Making  Stable/Uncomplicated    Rehab Potential  Excellent    PT Frequency  3x / week    PT Duration  4 weeks    PT Treatment/Interventions  ADLs/Self Care Home Management;Therapeutic exercise;Patient/family education;Manual techniques;Manual lymph drainage;Scar mobilization    PT Next Visit Plan  L-Dex every 3 months; manual lymph drainage left breast and lateral trunk and instruct pt's husband; instruct pt and have her return demo of MLD    PT Home Exercise Plan  Self MLD daily and end ROM and postural stretches for Lt upper quadrant    Consulted and Agree with Plan of Care  Patient       Patient will benefit from skilled therapeutic intervention in order to improve the following deficits and impairments:  Postural dysfunction, Decreased range of motion, Pain, Impaired UE functional use, Decreased knowledge of precautions, Increased edema  Visit Diagnosis: Malignant neoplasm of upper-outer quadrant of left breast in female, estrogen receptor positive (HCC)  Abnormal posture  Aftercare following surgery for neoplasm  Localized edema  At risk for lymphedema     Problem List Patient Active Problem List   Diagnosis Date Noted  . Genetic testing 08/10/2019  . Family history of prostate cancer in father   . Malignant neoplasm of upper-outer  quadrant of left breast in female, estrogen receptor positive ([place]) 07/30/2019    [name], PTA 11/07/2019, 12:21 PM  [place], [place], [address] Phone: [phone]   Fax:  [phone]  Name: Jolan Mealor MRN: [id number] Date of Birth: Jun 26, 1971

## 2019-11-08 ENCOUNTER — Ambulatory Visit
Admission: RE | Admit: 2019-11-08 | Discharge: 2019-11-08 | Disposition: A | Discharge status: Home / Self Care | Payer: BC Managed Care – PPO | Source: Ambulatory Visit | Attending: Radiation Oncology | Admitting: Radiation Oncology

## 2019-11-08 ENCOUNTER — Ambulatory Visit: Payer: BC Managed Care – PPO | Admitting: Radiation Oncology

## 2019-11-08 ENCOUNTER — Ambulatory Visit: Payer: BC Managed Care – PPO

## 2019-11-08 ENCOUNTER — Other Ambulatory Visit: Payer: Self-pay

## 2019-11-08 DIAGNOSIS — R293 Abnormal posture: Secondary | ICD-10-CM

## 2019-11-08 DIAGNOSIS — C50412 Malignant neoplasm of upper-outer quadrant of left female breast: Secondary | ICD-10-CM

## 2019-11-08 DIAGNOSIS — Z9189 Other specified personal risk factors, not elsewhere classified: Secondary | ICD-10-CM

## 2019-11-08 DIAGNOSIS — Z170 Estrogen receptor positive status [ER+]: Secondary | ICD-10-CM

## 2019-11-08 DIAGNOSIS — Z483 Aftercare following surgery for neoplasm: Secondary | ICD-10-CM

## 2019-11-08 DIAGNOSIS — R600 Localized edema: Secondary | ICD-10-CM

## 2019-11-08 NOTE — Therapy (Signed)
[place], [place], [address] Phone: [phone]   Fax:  [phone]  Physical Therapy Treatment  Patient Details  Name: Robin Castillo MRN: [id number] Date of Birth: [birth year] Referring Provider (PT): [name], PA-C   Encounter Date: 11/08/2019  PT End of Session - 11/08/19 1001    Visit Number  7    Number of Visits  15    Date for PT Re-Evaluation  11/26/19    PT Start Time  0901    PT Stop Time  1000    PT Time Calculation (min)  59 min    Activity Tolerance  Patient tolerated treatment well    Behavior During Therapy  [facility] for tasks assessed/performed       Past Medical History:  Diagnosis Date  . Arthritis    per patient new onset in finger joints  . BV (bacterial vaginosis) 2012  . Cancer ([place]) 2021   left breast   . Cyst of ovary, right 2003  . Family history of prostate cancer in father   . GBS carrier   . H/O rubella   . H/O varicella   . History of PCOS 06/2005  . Hypertension   . Infertility, female   . Irregular menses 11/2001  . Leukorrhea 04/2007   phsiological  . Pregnancy induced hypertension   . Psychosocial stressors 10/2009    Past Surgical History:  Procedure Laterality Date  . BREAST ENHANCEMENT SURGERY    . BREAST LUMPECTOMY WITH RADIOACTIVE SEED AND SENTINEL LYMPH NODE BIOPSY Left 08/21/2019   Procedure: LEFT BREAST LUMPECTOMY WITH RADIOACTIVE SEED AND LEFT AXILLARY SENTINEL LYMPH NODE BIOPSY;  Surgeon: [name], MD;  Location: [place];  Service: General;  Laterality: Left;  . CESAREAN SECTION    . RE-EXCISION OF BREAST LUMPECTOMY Left 09/11/2019   Procedure: LEFT RE-EXCISION OF BREAST MARGIN;  Surgeon: [name], MD;  Location: [place];  Service: General;  Laterality: Left;    There were no vitals filed for this visit.  Subjective Assessment - 11/08/19 0903    Subjective  I had my first boost  treatment this morning. Nothing else new since yesterday.    Pertinent History  Patient was diagnosed on 07/25/2019 with left grade II invasive ductal carcinoma breast cancer. Patient reports she underwent a left lumpectomy and sentinel node biopsy (1 node removed with micromets) on 08/21/2019. It is ER/PR positive and HER2 negative with a Ki67 of 2%.    Patient Stated Goals  Help get rid of left breast swelling.    Currently in Pain?  No/denies                        [facility] Adult PT Treatment/Exercise - 11/08/19 0001      Manual Therapy   Manual Therapy  Myofascial release;Manual Lymphatic Drainage (MLD);Passive ROM;Soft tissue mobilization    Soft tissue mobilization  In Rt S/L along lateral border of Lt scapula with UE in passive flexion    Myofascial Release  To Lt axilla in end flexion, abduction, and D2 and to SLNB incision where scar tissue palpable and pt reports tenderness    Manual Lymphatic Drainage (MLD)  Manual lymph drainage to left upper quadrant in supine: short neck, right axillary and left inguinal nodes, anterior inter-axillary and left axillo-inguinal pathways; left upper arm redirecting along pathways and left breast focused on superior aspect (min redness present on skin from radiation but pt  reports no skin pulling felt) redirecting along pathways; then into Rt S/L for assess of lateral breast tissue. Some min firmness palpated lateral to areoal incision that is scar tissue, educated pt on this as she was concerned about this area, then Lt axillo-inguinal anastomosis after STM to lateral scapula; then finished retracing anastomosis in supine    Passive ROM  In Supine to Lt shoulder into end ranges of flexion, abduction and D2 with MFR during; also scapular depression at superior shoulder before each stetch                  PT Long Term Goals - 10/29/19 1210      PT LONG TERM GOAL #1   Title  Patient will be independent with performing self manual  lymph drainage for left breast to reduce swelling.    Baseline  No knowledge    Time  4    Period  Weeks    Status  New    Target Date  11/26/19      PT LONG TERM GOAL #2   Title  Patient will report >/= 50% less discomfort in her left breast after performing manual lymph drainage.    Time  4    Period  Weeks    Status  New    Target Date  11/26/19            Plan - 11/08/19 1002    Clinical Impression Statement  Pt and therapist noticed improvement with end Lt shoulder P/ROM as well as some less tenderness at SLNB incision, though still with some TTP. Pt also reports benefit of STM to Lt lateral scapula. Overall pts skin is looking well and she has just 3 more radiation boost treatments. Encouraged pt throughout that firmness under SLNB incision is scar tissue and not new lump as pt questioned this a few times. She is making good progress towards maintaining and even gaining Lt shoulder end P/ROM and she has less breast swelling at superior breast from last week. Pt will be ready for progression of HEP next session.    Stability/Clinical Decision Making  Stable/Uncomplicated    Rehab Potential  Excellent    PT Frequency  3x / week    PT Duration  4 weeks    PT Treatment/Interventions  ADLs/Self Care Home Management;Therapeutic exercise;Patient/family education;Manual techniques;Manual lymph drainage;Scar mobilization    PT Next Visit Plan  L-Dex every 3 months; add supine scapular series; manual lymph drainage left breast and lateral trunk and instruct pt's husband; instruct pt and have her return demo of MLD    PT Home Exercise Plan  Self MLD daily and end ROM and postural stretches for Lt upper quadrant    Consulted and Agree with Plan of Care  Patient       Patient will benefit from skilled therapeutic intervention in order to improve the following deficits and impairments:  Postural dysfunction, Decreased range of motion, Pain, Impaired UE functional use, Decreased knowledge of  precautions, Increased edema  Visit Diagnosis: Malignant neoplasm of upper-outer quadrant of left breast in female, estrogen receptor positive (HCC)  Abnormal posture  Aftercare following surgery for neoplasm  Localized edema  At risk for lymphedema     Problem List Patient Active Problem List   Diagnosis Date Noted  . Genetic testing 08/10/2019  . Family history of prostate cancer in father   . Malignant neoplasm of upper-outer quadrant of left breast in female, estrogen receptor positive ([place]) 07/30/2019  [name], PTA 11/08/2019, 10:55 AM  [place], [place], [address] Phone: [phone]   Fax:  [phone]  Name: Robin Castillo MRN: [id number] Date of Birth: [birth year]

## 2019-11-09 ENCOUNTER — Inpatient Hospital Stay: Payer: BC Managed Care – PPO | Attending: Hematology and Oncology

## 2019-11-09 ENCOUNTER — Other Ambulatory Visit: Payer: Self-pay

## 2019-11-09 ENCOUNTER — Ambulatory Visit
Admission: RE | Admit: 2019-11-09 | Discharge: 2019-11-09 | Disposition: A | Discharge status: Home / Self Care | Payer: BC Managed Care – PPO | Source: Ambulatory Visit | Attending: Radiation Oncology | Admitting: Radiation Oncology

## 2019-11-09 VITALS — BP 128/72 | HR 72 | Temp 98.2°F | Resp 18

## 2019-11-09 DIAGNOSIS — C50412 Malignant neoplasm of upper-outer quadrant of left female breast: Secondary | ICD-10-CM | POA: Diagnosis not present

## 2019-11-09 DIAGNOSIS — Z5111 Encounter for antineoplastic chemotherapy: Secondary | ICD-10-CM | POA: Insufficient documentation

## 2019-11-09 DIAGNOSIS — Z170 Estrogen receptor positive status [ER+]: Secondary | ICD-10-CM | POA: Diagnosis not present

## 2019-11-09 MED ORDER — GOSERELIN ACETATE 3.6 MG ~~LOC~~ IMPL
3.6000 mg | DRUG_IMPLANT | Freq: Once | SUBCUTANEOUS | Status: AC
  Administered 2019-11-09: 3.6 mg via SUBCUTANEOUS

## 2019-11-09 NOTE — Patient Instructions (Signed)

## 2019-11-12 ENCOUNTER — Ambulatory Visit
Admission: RE | Admit: 2019-11-12 | Discharge: 2019-11-12 | Disposition: A | Discharge status: Home / Self Care | Payer: BC Managed Care – PPO | Source: Ambulatory Visit | Attending: Radiation Oncology | Admitting: Radiation Oncology

## 2019-11-12 ENCOUNTER — Other Ambulatory Visit: Payer: Self-pay

## 2019-11-12 DIAGNOSIS — C50412 Malignant neoplasm of upper-outer quadrant of left female breast: Secondary | ICD-10-CM | POA: Diagnosis not present

## 2019-11-13 ENCOUNTER — Ambulatory Visit
Admission: RE | Admit: 2019-11-13 | Discharge: 2019-11-13 | Disposition: A | Discharge status: Home / Self Care | Payer: BC Managed Care – PPO | Source: Ambulatory Visit | Attending: Radiation Oncology | Admitting: Radiation Oncology

## 2019-11-13 ENCOUNTER — Encounter: Payer: Self-pay | Admitting: *Deleted

## 2019-11-13 ENCOUNTER — Other Ambulatory Visit: Payer: Self-pay

## 2019-11-13 ENCOUNTER — Encounter: Payer: Self-pay | Admitting: Radiation Oncology

## 2019-11-13 DIAGNOSIS — C50412 Malignant neoplasm of upper-outer quadrant of left female breast: Secondary | ICD-10-CM | POA: Diagnosis not present

## 2019-11-26 NOTE — Progress Notes (Signed)
Robin Castillo  [phone] Fax:([phone]) [phone]     ID: Robin Castillo DOB: 1971-02-22  MR#: [id number]  ZRA#:[id number]  Patient Care Team: [name], MD as PCP - General (Internal Medicine) Rockwell Germany, RN as Oncology Nurse Navigator Mauro Kaufmann, RN as Oncology Nurse Navigator Kyung Rudd, MD as Consulting Physician (Radiation Oncology) [name], MD as Consulting Physician (General Surgery) Tim Wilhide, Virgie Dad, MD as Consulting Physician (Oncology) Earnstine Regal, PA-C as Consulting Physician (Obstetrics and Gynecology) Belva Crome, MD as Consulting Physician (Cardiology) Chauncey Cruel, MD OTHER MD:  CHIEF COMPLAINT: Invasive ductal breast cancer, estrogen receptor positive  CURRENT TREATMENT: goserelin, anastrozole   INTERVAL HISTORY: Robin Castillo returns today for follow up of her estrogen receptor positive breast cancer.   Since her last visit, she completed adjuvant radiation therapy (from 10/17/2019 through 11/13/2019) under Dr. Lisbeth Renshaw.  She started Zoladex/goserelin 10/12/2019.  She had a heavier than usua as expected.  After the second dose, 11/09/2019, she has had no further periods.   REVIEW OF SYSTEMS: Robin Castillo has had a few very mild hot flashes.  She has a little bit of vaginal dryness issues.  She tolerated radiation well with some skin changes and some peeling in the armpit only.  She was mild to moderately fatigued.  She is very excited about her new job with the former [employer] she had many questions regarding her prognosis and treatment options and she is considering bilateral salpingo-oophorectomy and also hysterectomy.   HISTORY OF CURRENT ILLNESS: From the original intake note:  Robin Castillo presented with a palpable left breast lump. She underwent bilateral diagnostic mammography with tomography and left breast ultrasonography at [facility] on 07/25/2019 showing: breast density category B; mass with  associated clustered calcifications (overall dimension up to 1.2 cm on mammogram and 0.9 cm on ultrasound) within upper-outer left breast at 1 o'clock; left axillary lymph node appears within normal limits.  Accordingly on 07/26/2019 she proceeded to biopsy of the left breast area in question. The pathology from this procedure ([id number]) showed: invasive ductal carcinoma, grade 2. Prognostic indicators significant for: estrogen receptor, 70% positive with moderate staining intensity and progesterone receptor, 95% positive with strong staining intensity. Proliferation marker Ki67 at 2%. HER2 negative by immunohistochemistry (1+).  The patient's subsequent history is as detailed below.   PAST MEDICAL HISTORY: Past Medical History:  Diagnosis Date  . Arthritis    per patient new onset in finger joints  . BV (bacterial vaginosis) 2012  . Cancer ([place]) 2021   left breast   . Cyst of ovary, right 2003  . Family history of prostate cancer in father   . GBS carrier   . H/O rubella   . H/O varicella   . History of PCOS 06/2005  . Hypertension   . Infertility, female   . Irregular menses 11/2001  . Leukorrhea 04/2007   phsiological  . Pregnancy induced hypertension   . Psychosocial stressors 10/2009    PAST SURGICAL HISTORY: Past Surgical History:  Procedure Laterality Date  . BREAST ENHANCEMENT SURGERY    . BREAST LUMPECTOMY WITH RADIOACTIVE SEED AND SENTINEL LYMPH NODE BIOPSY Left 08/21/2019   Procedure: LEFT BREAST LUMPECTOMY WITH RADIOACTIVE SEED AND LEFT AXILLARY SENTINEL LYMPH NODE BIOPSY;  Surgeon: [name], MD;  Location: [facility];  Service: General;  Laterality: Left;  . CESAREAN SECTION    . RE-EXCISION OF BREAST LUMPECTOMY Left 09/11/2019   Procedure: LEFT RE-EXCISION OF BREAST MARGIN;  Surgeon:  [name], MD;  Location: [place];  Service: General;  Laterality: Left;    FAMILY HISTORY: Family History  Problem Relation Age of  Onset  . Heart disease Mother   . Hypertension Mother   . Heart disease Father   . Depression Father   . Alcohol abuse Father   . Prostate cancer Father 62  . Heart disease Maternal Grandmother   . Heart disease Maternal Grandfather    Patient's father is 77 years old and patient's mother 36 years old (as of 07/2019) The patient denies a family hx of breast or ovarian cancer. She does report her father was recently diagnosed with prostate cancer. She has 1 sister.   GYNECOLOGIC HISTORY:  No LMP recorded. Menarche: 49 years old Age at first live birth: 49 years old [place] P 2 LMP 07/28/2019, periods are irregular Contraceptive: yes, used for more than 20 years HRT n/a  Hysterectomy? no BSO? 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 Husband Robin Castillo is a self-employed Forensic psychologist. She lives at home with her husband and two sons:Robin Castillo  34 and Robin Castillo. She attends a local [employer]   ADVANCED DIRECTIVES: In the absence of any documentation to the contrary, the patient's spouse is their HCPOA.    HEALTH MAINTENANCE: Social History   Tobacco Use  . Smoking status: Never Smoker  . Smokeless tobacco: Never Used  Substance Use Topics  . Alcohol use: Not Currently    Comment: Glass of champagne once a month  . Drug use: No     Colonoscopy: n/a (age)  PAP: 06/2018  Bone density: n/a (age)   No Known Allergies  Current Outpatient Medications  Medication Sig Dispense Refill  . anastrozole (ARIMIDEX) 1 MG tablet Take 1 tablet (1 mg total) by mouth daily. 90 tablet 0  . diclofenac (VOLTAREN) 75 MG EC tablet Take 75 mg by mouth daily as needed for moderate pain.    Marland Kitchen lidocaine-prilocaine (EMLA) cream Apply 1 application topically as needed. Use as directed 1 hour prior to injection 30 g 0  . lisinopril-hydrochlorothiazide (PRINZIDE,ZESTORETIC) 10-12.5 MG per tablet  Take 1 tablet by mouth daily.    Marland Kitchen oxyCODONE (OXY IR/ROXICODONE) 5 MG immediate release tablet Take 1 tablet (5 mg total) by mouth every 6 (six) hours as needed for moderate pain, severe pain or breakthrough pain. (Patient not taking: Reported on 10/09/2019) 10 tablet 0   No current facility-administered medications for this visit.    OBJECTIVE:  Native American woman who appears well  Vitals:   11/27/19 1345  BP: (!) 153/97  Pulse: 70  Resp: 18  Temp: 98.7 F (37.1 C)  SpO2: 100%     Body mass index is 23.75 kg/m.   Wt Readings from Last 3 Encounters:  11/27/19 142 lb 11.2 oz (64.7 kg)  10/10/19 141 lb 9.6 oz (64.2 kg)  10/04/19 143 lb (64.9 kg)      ECOG FS:1 - Symptomatic but completely ambulatory  Sclerae unicteric, EOMs intact Wearing a mask No cervical or supraclavicular adenopathy Lungs no rales or rhonchi Heart regular rate and rhythm Abd soft, nontender, positive bowel sounds MSK no focal spinal tenderness, no upper extremity lymphedema Neuro: nonfocal, well oriented, appropriate affect Breasts: The right breast is benign.  The left breast is status post lumpectomy and radiation.  There is some swelling  and some coarsening of the skin as well as mild hyperpigmentation chiefly over the boost area.  There is no evidence of local recurrence.  Both axillae are benign.   LAB RESULTS:  CMP     Component Value Date/Time   NA 142 11/27/2019 1326   K 3.6 11/27/2019 1326   CL 106 11/27/2019 1326   CO2 26 11/27/2019 1326   GLUCOSE 87 11/27/2019 1326   BUN 6 11/27/2019 1326   CREATININE 0.81 11/27/2019 1326   CREATININE 0.77 08/01/2019 1234   CALCIUM 9.2 11/27/2019 1326   PROT 7.2 11/27/2019 1326   ALBUMIN 4.1 11/27/2019 1326   AST 23 11/27/2019 1326   AST 15 08/01/2019 1234   ALT 23 11/27/2019 1326   ALT 13 08/01/2019 1234   ALKPHOS 69 11/27/2019 1326   BILITOT 0.3 11/27/2019 1326   BILITOT 0.3 08/01/2019 1234   GFRNONAA >60 11/27/2019 1326   GFRNONAA >60  08/01/2019 1234   GFRAA >60 11/27/2019 1326   GFRAA >60 08/01/2019 1234    No results found for: TOTALPROTELP, ALBUMINELP, A1GS, A2GS, BETS, BETA2SER, GAMS, MSPIKE, SPEI  Lab Results  Component Value Date   WBC 3.1 (L) 11/27/2019   NEUTROABS 2.0 11/27/2019   HGB 9.4 (L) 11/27/2019   HCT 32.1 (L) 11/27/2019   MCV 74.8 (L) 11/27/2019   PLT 251 11/27/2019    No results found for: LABCA2  No components found for: DSKAJG811  No results for input(s): INR in the last 168 hours.  No results found for: LABCA2  No results found for: XBW620  No results found for: BTD974  No results found for: BUL845  No results found for: CA2729  No components found for: HGQUANT  No results found for: CEA1 / No results found for: CEA1   No results found for: AFPTUMOR  No results found for: CHROMOGRNA  No results found for: KPAFRELGTCHN, LAMBDASER, KAPLAMBRATIO (kappa/lambda light chains)  No results found for: HGBA, HGBA2QUANT, HGBFQUANT, HGBSQUAN (Hemoglobinopathy evaluation)   No results found for: LDH  No results found for: IRON, TIBC, IRONPCTSAT (Iron and TIBC)  No results found for: FERRITIN  Urinalysis No results found for: COLORURINE, APPEARANCEUR, LABSPEC, PHURINE, GLUCOSEU, HGBUR, BILIRUBINUR, KETONESUR, PROTEINUR, UROBILINOGEN, NITRITE, LEUKOCYTESUR   STUDIES: No results found.   ELIGIBLE FOR AVAILABLE RESEARCH PROTOCOL: no  ASSESSMENT: 49 y.o. [place] woman status post left breast upper outer quadrant biopsy 07/26/2019 for a clinical T1b-c N0, stage Ia invasive ductal carcinoma, grade 2, estrogen and progesterone receptor positive, HER-2 not amplified, with an MIB-1 of 2  (1) status post left lumpectomy and sentinel lymph node sampling 08/21/2019 for a pT1c pN1(mic), 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  (a) a single sentinel lymph node was removed  (b) additional surgery 09/11/2019 cleared the margins  (2)  Oncotype score of 16 predicts a risk of recurrence outside the breast in the next 9 years of 15% if the patient's only systemic therapy is tamoxifen for 5 years.  It also predicts no apparent benefit from chemotherapy.  (3) adjuvant radiation 10/17/2019 through 11/13/2019  (4) antiestrogens for a minimum of 5 years:  (a) goserelin/Zoladex started 10/12/2019  (b) anastrozole started 11/27/2019  (5) genetics testing 08/09/2019 through the [facility] Breast Cancer STAT and Common Hereditary Cancers panels found no deleterious mutations in ATM, BRCA1, BRCA2, CDH1, CHEK2, PALB2, PTEN, STK11 and TP53.  APC, ATM, AXIN2, BARD1, BMPR1A, BRCA1, BRCA2, BRIP1, CDH1, CDK4, CDKN2A (p14ARF), CDKN2A (p16INK4a), CHEK2, CTNNA1, DICER1, EPCAM (  Deletion/duplication testing only), GREM1 (promoter region deletion/duplication testing only), KIT, MEN1, MLH1, MSH2, MSH3, MSH6, MUTYH, NBN, NF1, NHTL1, PALB2, PDGFRA, PMS2, POLD1, POLE, PTEN, RAD50, RAD51C, RAD51D, RNF43, SDHB, SDHC, SDHD, SMAD4, SMARCA4. STK11, TP53, TSC1, TSC2, and VHL.  The following genes were evaluated for sequence changes only: SDHA and HOXB13 c.251G>A variant only.  (a) A variant of uncertain significance was detected in the MSH3 gene called c.803G>A (p.Arg268Gln). The report date is 08/09/2019.   PLAN: Dennice did well with radiation and now has completed local therapy for her breast cancer.  She has had her second dose of goserelin.  She is now functionally menopausal.  So far she is not having significant side effects from this.  Accordingly we again reviewed antiestrogens.  She will start anastrozole today.  She has a good understanding of the possible toxicities side effects and complications of this agent.  She is considering bilateral salpingo-oophorectomy.  I think this is very reasonable and I will save her the trouble of monthly injections for 5 years.  She is also considering a hysterectomy.  We discussed the fact that there is no particular  connection between the uterus and the breast in the absence of specific genetic mutations.  Removing the uterus would not further lower her risk of breast cancer recurrence.  Nevertheless she is considering this and will be discussing it with Dr. [name].  Robin Castillo is having some vaginal dryness issues already.  I offered her referral to our pelvic rehab program and she will let me know if she wishes for me to write for that.  She is borderline iron deficient.  I suggested she take ferrous sulfate once daily with food.  We will repeat her counts when she returns to see me in 6 weeks.  I expect her white cell count, possibly due to the recent radiation, will have normalized as well  She knows to call for any other issue that may develop before the next visit.  Total encounter time 35 minutes.Chauncey Cruel, MD   11/27/2019 3:35 PM Medical Oncology and Hematology [facility] [place], [place] [address] Tel. [phone]    Fax. [phone]   This document serves as a record of services personally performed by Lurline Del, MD. It was created on his behalf by Wilburn Mylar, a trained medical scribe. The creation of this record is based on the scribe's personal observations and the provider's statements to them.   I, Lurline Del MD, have reviewed the above documentation for accuracy and completeness, and I agree with the above.   *Total Encounter Time as defined by the Centers for Medicare and Medicaid Services includes, in addition to the face-to-face time of a patient visit (documented in the note above) non-face-to-face time: obtaining and reviewing outside history, ordering and reviewing medications, tests or procedures, care coordination (communications with other health care professionals or caregivers) and documentation in the medical record.

## 2019-11-27 ENCOUNTER — Inpatient Hospital Stay: Payer: 59 | Attending: Hematology and Oncology

## 2019-11-27 ENCOUNTER — Other Ambulatory Visit: Payer: Self-pay

## 2019-11-27 ENCOUNTER — Inpatient Hospital Stay (HOSPITAL_BASED_OUTPATIENT_CLINIC_OR_DEPARTMENT_OTHER): Payer: 59 | Admitting: Oncology

## 2019-11-27 VITALS — BP 153/97 | HR 70 | Temp 98.7°F | Resp 18 | Ht 65.0 in | Wt 142.7 lb

## 2019-11-27 DIAGNOSIS — Z79899 Other long term (current) drug therapy: Secondary | ICD-10-CM | POA: Diagnosis not present

## 2019-11-27 DIAGNOSIS — R232 Flushing: Secondary | ICD-10-CM | POA: Insufficient documentation

## 2019-11-27 DIAGNOSIS — Z5111 Encounter for antineoplastic chemotherapy: Secondary | ICD-10-CM | POA: Insufficient documentation

## 2019-11-27 DIAGNOSIS — Z170 Estrogen receptor positive status [ER+]: Secondary | ICD-10-CM

## 2019-11-27 DIAGNOSIS — Z818 Family history of other mental and behavioral disorders: Secondary | ICD-10-CM | POA: Insufficient documentation

## 2019-11-27 DIAGNOSIS — C50412 Malignant neoplasm of upper-outer quadrant of left female breast: Secondary | ICD-10-CM | POA: Diagnosis not present

## 2019-11-27 DIAGNOSIS — Z811 Family history of alcohol abuse and dependence: Secondary | ICD-10-CM | POA: Insufficient documentation

## 2019-11-27 DIAGNOSIS — Z79811 Long term (current) use of aromatase inhibitors: Secondary | ICD-10-CM | POA: Diagnosis not present

## 2019-11-27 DIAGNOSIS — Z8249 Family history of ischemic heart disease and other diseases of the circulatory system: Secondary | ICD-10-CM | POA: Insufficient documentation

## 2019-11-27 DIAGNOSIS — Z8042 Family history of malignant neoplasm of prostate: Secondary | ICD-10-CM | POA: Insufficient documentation

## 2019-11-27 LAB — COMPREHENSIVE METABOLIC PANEL
ALT: 23 U/L (ref 0–44)
AST: 23 U/L (ref 15–41)
Albumin: 4.1 g/dL (ref 3.5–5.0)
Alkaline Phosphatase: 69 U/L (ref 38–126)
Anion gap: 10 (ref 5–15)
BUN: 6 mg/dL (ref 6–20)
CO2: 26 mmol/L (ref 22–32)
Calcium: 9.2 mg/dL (ref 8.9–10.3)
Chloride: 106 mmol/L (ref 98–111)
Creatinine, Ser: 0.81 mg/dL (ref 0.44–1.00)
GFR calc Af Amer: >60 mL/min (ref >60)
GFR calc non Af Amer: >60 mL/min (ref >60)
Glucose, Bld: 87 mg/dL (ref 70–99)
Potassium: 3.6 mmol/L (ref 3.5–5.1)
Sodium: 142 mmol/L (ref 135–145)
Total Bilirubin: 0.3 mg/dL (ref 0.3–1.2)
Total Protein: 7.2 g/dL (ref 6.5–8.1)

## 2019-11-27 LAB — CBC WITH DIFFERENTIAL/PLATELET
Abs Immature Granulocytes: 0.01 10*3/uL (ref 0.00–0.07)
Basophils Absolute: 0 10*3/uL (ref 0.0–0.1)
Basophils Relative: 1 %
Eosinophils Absolute: 0 10*3/uL (ref 0.0–0.5)
Eosinophils Relative: 0 %
HCT: 32.1 % — ABNORMAL LOW (ref 36.0–46.0)
Hemoglobin: 9.4 g/dL — ABNORMAL LOW (ref 12.0–15.0)
Immature Granulocytes: 0 %
Lymphocytes Relative: 29 %
Lymphs Abs: 0.9 10*3/uL (ref 0.7–4.0)
MCH: 21.9 pg — ABNORMAL LOW (ref 26.0–34.0)
MCHC: 29.3 g/dL — ABNORMAL LOW (ref 30.0–36.0)
MCV: 74.8 fL — ABNORMAL LOW (ref 80.0–100.0)
Monocytes Absolute: 0.2 10*3/uL (ref 0.1–1.0)
Monocytes Relative: 6 %
Neutro Abs: 2 10*3/uL (ref 1.7–7.7)
Neutrophils Relative %: 64 %
Platelets: 251 10*3/uL (ref 150–400)
RBC: 4.29 MIL/uL (ref 3.87–5.11)
RDW: 15.3 % (ref 11.5–15.5)
WBC: 3.1 10*3/uL — ABNORMAL LOW (ref 4.0–10.5)
nRBC: 0 % (ref 0.0–0.2)

## 2019-11-28 ENCOUNTER — Telehealth: Payer: Self-pay | Admitting: Oncology

## 2019-11-28 NOTE — Telephone Encounter (Signed)
Scheduled appts per 6/1 los. Pt confirmed appt date and time.

## 2019-12-07 ENCOUNTER — Other Ambulatory Visit: Payer: Self-pay

## 2019-12-07 ENCOUNTER — Inpatient Hospital Stay: Payer: 59

## 2019-12-07 ENCOUNTER — Other Ambulatory Visit: Payer: Self-pay | Admitting: Oncology

## 2019-12-07 VITALS — BP 157/97 | HR 68 | Temp 98.1°F | Resp 18

## 2019-12-07 DIAGNOSIS — Z170 Estrogen receptor positive status [ER+]: Secondary | ICD-10-CM

## 2019-12-07 DIAGNOSIS — Z5111 Encounter for antineoplastic chemotherapy: Secondary | ICD-10-CM | POA: Diagnosis not present

## 2019-12-07 DIAGNOSIS — C50412 Malignant neoplasm of upper-outer quadrant of left female breast: Secondary | ICD-10-CM

## 2019-12-07 MED ORDER — GOSERELIN ACETATE 3.6 MG ~~LOC~~ IMPL
DRUG_IMPLANT | SUBCUTANEOUS | Status: AC
  Filled 2019-12-07: qty 3.6

## 2019-12-07 MED ORDER — GOSERELIN ACETATE 3.6 MG ~~LOC~~ IMPL
3.6000 mg | DRUG_IMPLANT | Freq: Once | SUBCUTANEOUS | Status: AC
  Administered 2019-12-07: 3.6 mg via SUBCUTANEOUS

## 2019-12-24 ENCOUNTER — Ambulatory Visit: Payer: 59 | Attending: General Surgery

## 2019-12-24 DIAGNOSIS — C50412 Malignant neoplasm of upper-outer quadrant of left female breast: Secondary | ICD-10-CM | POA: Insufficient documentation

## 2019-12-24 DIAGNOSIS — Z170 Estrogen receptor positive status [ER+]: Secondary | ICD-10-CM

## 2019-12-24 NOTE — Therapy (Signed)
[place], [place], [address] Phone: [phone]   Fax:  [phone]  Physical Therapy Treatment  Patient Details  Name: Robin Castillo MRN: [id number] Date of Birth: [birth year] Referring Provider (PT): [name], [place]   Encounter Date: 12/24/2019   PT End of Session - 12/24/19 1530    Visit Number 7   no number change due to screen only   Number of Visits 15    PT Start Time 1515    PT Stop Time 1529    PT Time Calculation (min) 14 min           Past Medical History:  Diagnosis Date  . Arthritis    per patient new onset in finger joints  . BV (bacterial vaginosis) 2012  . Cancer ([place]) 2021   left breast   . Cyst of ovary, right 2003  . Family history of prostate cancer in father   . GBS carrier   . H/O rubella   . H/O varicella   . History of PCOS 06/2005  . Hypertension   . Infertility, female   . Irregular menses 11/2001  . Leukorrhea 04/2007   phsiological  . Pregnancy induced hypertension   . Psychosocial stressors 10/2009    Past Surgical History:  Procedure Laterality Date  . BREAST ENHANCEMENT SURGERY    . BREAST LUMPECTOMY WITH RADIOACTIVE SEED AND SENTINEL LYMPH NODE BIOPSY Left 08/21/2019   Procedure: LEFT BREAST LUMPECTOMY WITH RADIOACTIVE SEED AND LEFT AXILLARY SENTINEL LYMPH NODE BIOPSY;  Surgeon: [name], MD;  Location: [facility];  Service: General;  Laterality: Left;  . CESAREAN SECTION    . RE-EXCISION OF BREAST LUMPECTOMY Left 09/11/2019   Procedure: LEFT RE-EXCISION OF BREAST MARGIN;  Surgeon: [name], MD;  Location: [facility];  Service: General;  Laterality: Left;    There were no vitals filed for this visit.   Subjective Assessment - 12/24/19 1517    Subjective Pt returns for L-dex screen.                                          PT Long Term Goals - 10/29/19 1210       PT LONG TERM GOAL #1   Title Patient will be independent with performing self manual lymph drainage for left breast to reduce swelling.    Baseline No knowledge    Time 4    Period Weeks    Status New    Target Date 11/26/19      PT LONG TERM GOAL #2   Title Patient will report >/= 50% less discomfort in her left breast after performing manual lymph drainage.    Time 4    Period Weeks    Status New    Target Date 11/26/19                 Plan - 12/24/19 1531    Clinical Impression Statement Pts L-dex screen within normal range. Encouraged her to cont self MLD while recovering from radiation.    PT Next Visit Plan Return for L-dex in 3 months    Consulted and Agree with Plan of Care Patient           Patient will benefit from skilled therapeutic intervention in order to improve the following deficits and impairments:     Visit Diagnosis: Malignant  neoplasm of upper-outer quadrant of left breast in female, estrogen receptor positive ([place])     Problem List Patient Active Problem List   Diagnosis Date Noted  . Genetic testing 08/10/2019  . Family history of prostate cancer in father   . Malignant neoplasm of upper-outer quadrant of left breast in female, estrogen receptor positive ([place]) 07/30/2019    [name], PTA 12/24/2019, 3:32 PM  [place], [place], [address] Phone: [phone]   Fax:  [phone]  Name: Robin Castillo MRN: [id number] Date of Birth: 16-Jul-1970

## 2019-12-25 ENCOUNTER — Other Ambulatory Visit: Payer: Self-pay

## 2019-12-28 ENCOUNTER — Telehealth: Payer: Self-pay | Admitting: Oncology

## 2019-12-28 NOTE — Telephone Encounter (Signed)
R/s appt per 7/2 sch msg - pt is aware of new appt date and time.

## 2019-12-30 NOTE — Progress Notes (Signed)
  Radiation Oncology         [phone] ________________________________  Name: Robin Castillo MRN: [id number]  Date: 11/13/2019  DOB: 06/05/1971  End of Treatment Note  Diagnosis:   left-sided breast cancer     Indication for treatment:  Curative       Radiation treatment dates:   10/17/19 - 11/13/19  Site/dose:   The patient initially received a dose of 42.56 Gy in 16 fractions to the breast using whole-breast tangent fields. This was delivered using a 3-D conformal technique. The patient then received a boost to the seroma. This delivered an additional 10 Gy in 43fractions using an en face electron field due to the depth of the seroma. The total dose was 52.56Gy.  Narrative: The patient tolerated radiation treatment relatively well.   The patient had some expected skin irritation as she progressed during treatment. Moist desquamation was not present at the end of treatment.  Plan: The patient has completed radiation treatment. The patient will return to radiation oncology clinic for routine followup in one month. I advised the patient to call or return sooner if they have any questions or concerns related to their recovery or treatment. ________________________________  Jodelle Gross, M.D., Ph.D.

## 2020-01-02 ENCOUNTER — Inpatient Hospital Stay: Payer: 59 | Attending: Hematology and Oncology

## 2020-01-02 ENCOUNTER — Other Ambulatory Visit: Payer: Self-pay

## 2020-01-02 ENCOUNTER — Inpatient Hospital Stay: Payer: 59

## 2020-01-02 VITALS — BP 160/100 | HR 72 | Temp 98.3°F | Resp 18

## 2020-01-02 DIAGNOSIS — Z170 Estrogen receptor positive status [ER+]: Secondary | ICD-10-CM

## 2020-01-02 DIAGNOSIS — Z8042 Family history of malignant neoplasm of prostate: Secondary | ICD-10-CM | POA: Insufficient documentation

## 2020-01-02 DIAGNOSIS — C50412 Malignant neoplasm of upper-outer quadrant of left female breast: Secondary | ICD-10-CM

## 2020-01-02 DIAGNOSIS — Z818 Family history of other mental and behavioral disorders: Secondary | ICD-10-CM | POA: Insufficient documentation

## 2020-01-02 DIAGNOSIS — Z811 Family history of alcohol abuse and dependence: Secondary | ICD-10-CM | POA: Insufficient documentation

## 2020-01-02 DIAGNOSIS — Z5111 Encounter for antineoplastic chemotherapy: Secondary | ICD-10-CM | POA: Diagnosis not present

## 2020-01-02 DIAGNOSIS — Z8249 Family history of ischemic heart disease and other diseases of the circulatory system: Secondary | ICD-10-CM | POA: Insufficient documentation

## 2020-01-02 DIAGNOSIS — R232 Flushing: Secondary | ICD-10-CM | POA: Insufficient documentation

## 2020-01-02 DIAGNOSIS — Z79899 Other long term (current) drug therapy: Secondary | ICD-10-CM | POA: Insufficient documentation

## 2020-01-02 LAB — COMPREHENSIVE METABOLIC PANEL
ALT: 20 U/L (ref 0–44)
AST: 18 U/L (ref 15–41)
Albumin: 4.4 g/dL (ref 3.5–5.0)
Alkaline Phosphatase: 65 U/L (ref 38–126)
Anion gap: 8 (ref 5–15)
BUN: 4 mg/dL — ABNORMAL LOW (ref 6–20)
CO2: 30 mmol/L (ref 22–32)
Calcium: 10 mg/dL (ref 8.9–10.3)
Chloride: 105 mmol/L (ref 98–111)
Creatinine, Ser: 0.86 mg/dL (ref 0.44–1.00)
GFR calc Af Amer: >60 mL/min (ref >60)
GFR calc non Af Amer: >60 mL/min (ref >60)
Glucose, Bld: 87 mg/dL (ref 70–99)
Potassium: 3.7 mmol/L (ref 3.5–5.1)
Sodium: 143 mmol/L (ref 135–145)
Total Bilirubin: 0.3 mg/dL (ref 0.3–1.2)
Total Protein: 7.7 g/dL (ref 6.5–8.1)

## 2020-01-02 LAB — CBC WITH DIFFERENTIAL/PLATELET
Abs Immature Granulocytes: 0.01 10*3/uL (ref 0.00–0.07)
Basophils Absolute: 0 10*3/uL (ref 0.0–0.1)
Basophils Relative: 1 %
Eosinophils Absolute: 0.1 10*3/uL (ref 0.0–0.5)
Eosinophils Relative: 2 %
HCT: 40.9 % (ref 36.0–46.0)
Hemoglobin: 12.6 g/dL (ref 12.0–15.0)
Immature Granulocytes: 0 %
Lymphocytes Relative: 40 %
Lymphs Abs: 1.4 10*3/uL (ref 0.7–4.0)
MCH: 24.9 pg — ABNORMAL LOW (ref 26.0–34.0)
MCHC: 30.8 g/dL (ref 30.0–36.0)
MCV: 80.8 fL (ref 80.0–100.0)
Monocytes Absolute: 0.2 10*3/uL (ref 0.1–1.0)
Monocytes Relative: 6 %
Neutro Abs: 1.8 10*3/uL (ref 1.7–7.7)
Neutrophils Relative %: 51 %
Platelets: 239 10*3/uL (ref 150–400)
RBC: 5.06 MIL/uL (ref 3.87–5.11)
RDW: 23.1 % — ABNORMAL HIGH (ref 11.5–15.5)
WBC: 3.4 10*3/uL — ABNORMAL LOW (ref 4.0–10.5)
nRBC: 0 % (ref 0.0–0.2)

## 2020-01-02 MED ORDER — GOSERELIN ACETATE 3.6 MG ~~LOC~~ IMPL
3.6000 mg | DRUG_IMPLANT | Freq: Once | SUBCUTANEOUS | Status: AC
  Administered 2020-01-02: 3.6 mg via SUBCUTANEOUS

## 2020-01-02 MED ORDER — GOSERELIN ACETATE 3.6 MG ~~LOC~~ IMPL
DRUG_IMPLANT | SUBCUTANEOUS | Status: AC
  Filled 2020-01-02: qty 3.6

## 2020-01-04 ENCOUNTER — Other Ambulatory Visit: Payer: BC Managed Care – PPO

## 2020-01-04 ENCOUNTER — Ambulatory Visit: Payer: BC Managed Care – PPO

## 2020-01-04 ENCOUNTER — Ambulatory Visit: Payer: BC Managed Care – PPO | Admitting: Oncology

## 2020-01-16 ENCOUNTER — Inpatient Hospital Stay (HOSPITAL_BASED_OUTPATIENT_CLINIC_OR_DEPARTMENT_OTHER): Payer: 59 | Admitting: Medical

## 2020-01-16 ENCOUNTER — Telehealth: Payer: Self-pay | Admitting: *Deleted

## 2020-01-16 ENCOUNTER — Inpatient Hospital Stay: Payer: 59

## 2020-01-16 ENCOUNTER — Other Ambulatory Visit: Payer: Self-pay

## 2020-01-16 VITALS — HR 65 | Temp 98.4°F | Resp 17 | Ht 65.0 in | Wt 143.2 lb

## 2020-01-16 DIAGNOSIS — C50412 Malignant neoplasm of upper-outer quadrant of left female breast: Secondary | ICD-10-CM

## 2020-01-16 DIAGNOSIS — Z170 Estrogen receptor positive status [ER+]: Secondary | ICD-10-CM | POA: Diagnosis not present

## 2020-01-16 DIAGNOSIS — Z5111 Encounter for antineoplastic chemotherapy: Secondary | ICD-10-CM | POA: Diagnosis not present

## 2020-01-16 LAB — COMPREHENSIVE METABOLIC PANEL
ALT: 21 U/L (ref 0–44)
AST: 19 U/L (ref 15–41)
Albumin: 4.2 g/dL (ref 3.5–5.0)
Alkaline Phosphatase: 71 U/L (ref 38–126)
Anion gap: 8 (ref 5–15)
BUN: 6 mg/dL (ref 6–20)
CO2: 29 mmol/L (ref 22–32)
Calcium: 10.2 mg/dL (ref 8.9–10.3)
Chloride: 104 mmol/L (ref 98–111)
Creatinine, Ser: 0.8 mg/dL (ref 0.44–1.00)
GFR calc Af Amer: >60 mL/min (ref >60)
GFR calc non Af Amer: >60 mL/min (ref >60)
Glucose, Bld: 84 mg/dL (ref 70–99)
Potassium: 3.5 mmol/L (ref 3.5–5.1)
Sodium: 141 mmol/L (ref 135–145)
Total Bilirubin: 0.4 mg/dL (ref 0.3–1.2)
Total Protein: 7.3 g/dL (ref 6.5–8.1)

## 2020-01-16 LAB — CBC WITH DIFFERENTIAL/PLATELET
Abs Immature Granulocytes: 0.01 10*3/uL (ref 0.00–0.07)
Basophils Absolute: 0 10*3/uL (ref 0.0–0.1)
Basophils Relative: 0 %
Eosinophils Absolute: 0 10*3/uL (ref 0.0–0.5)
Eosinophils Relative: 1 %
HCT: 40.2 % (ref 36.0–46.0)
Hemoglobin: 12.5 g/dL (ref 12.0–15.0)
Immature Granulocytes: 0 %
Lymphocytes Relative: 34 %
Lymphs Abs: 1 10*3/uL (ref 0.7–4.0)
MCH: 25.3 pg — ABNORMAL LOW (ref 26.0–34.0)
MCHC: 31.1 g/dL (ref 30.0–36.0)
MCV: 81.2 fL (ref 80.0–100.0)
Monocytes Absolute: 0.2 10*3/uL (ref 0.1–1.0)
Monocytes Relative: 7 %
Neutro Abs: 1.8 10*3/uL (ref 1.7–7.7)
Neutrophils Relative %: 58 %
Platelets: 219 10*3/uL (ref 150–400)
RBC: 4.95 MIL/uL (ref 3.87–5.11)
RDW: 22.9 % — ABNORMAL HIGH (ref 11.5–15.5)
WBC: 3.1 10*3/uL — ABNORMAL LOW (ref 4.0–10.5)
nRBC: 0 % (ref 0.0–0.2)

## 2020-01-16 NOTE — Telephone Encounter (Signed)
This RN spoke with pt p

## 2020-01-16 NOTE — Telephone Encounter (Signed)
This RN spoke with pt per her call stating concern relating to recent zoladex injection-  She states she had the injection on 01/02/2020- " and when she took the needle out it bleed a lot - like saturated several 2x2s ".  She developed a hematoma, bruising and pain ( she took pictures ) and applied heat and ice for comfort.  She now states she feels at the injection site " like a knot- and I think it is the pellet sitting right under my skin "  She states she also is having increased vaginal wetness, vaginal blood spotting and " feel horny "- which she states is " like when I wasn't getting the zoladex - and that may mean the shot isn't working and then the [employer] working. "  She is wondering if she needs an additional shot.  She is also concerned that the nurse who administered is aware ( she is not filing a formal complaint) is made aware " for possible teaching of administration because she was actually training another nurse "  She states Corene Cornea is who she has requested to give her shots- and that is on her chart " and he was there that day but that is not who gave me my shot "  Per MD review- pt needs to come in for labs to check ovarian function and then be seen by Lucianne Lei for review of injection site.  If test come back showing non menopause status then can follow up appropriately regarding getting zoladex injection sooner then currently scheduled on 8/8.  Melanie RN AD has been notified of above as well due to pt's request as well as if early injection needed due to failure of the injection on 7/7 " I would think my insurance would not pay for this and it should be done at no cost to me "  Srija was very pleasant in her speech during conversation- stated " I understand how things can happen - just want to know if I need to have another shot and that the nurse who gave the injection is aware for possible additional training"  Plan - pt will come in for lab and visit today.

## 2020-01-17 LAB — FOLLICLE STIMULATING HORMONE: FSH: 8.3 m[IU]/mL

## 2020-01-17 LAB — ESTRADIOL: Estradiol: <5 pg/mL

## 2020-01-18 NOTE — Progress Notes (Signed)
[facility] Progress Note   Robin Castillo [id number] 04/10/1971 49 y.o.  Robin Castillo is managed by Dr. [name]  Actively treated with chemotherapy/immunotherapy/hormonal therapy: yes  Current therapy: Zoladex and anastrozole  Next scheduled appointment with provider: To be scheduled  Assessment: Plan:    Malignant neoplasm of upper-outer quadrant of left breast in female, estrogen receptor positive ([place])   ER positive malignant neoplasm of the left breast: The patient continues to be managed by Dr. [name] and is treated with Zoladex and anastrozole.  Please see After Visit Summary for patient specific instructions.  Future Appointments  Date Time Provider [place]  02/01/2020  8:15 AM [facility] 4 [facility] None  02/01/2020  8:45 AM [facility] FLUSH [facility] None  02/29/2020  8:15 AM [facility] LAB 4 [facility] None  02/29/2020  8:45 AM [facility] FLUSH [facility] None  03/28/2020  8:15 AM [facility] LAB 4 [facility] None  03/28/2020  8:45 AM [facility] [place] FLUSH [facility] None  04/25/2020  8:15 AM [facility] LAB 4 [facility] None  04/25/2020  8:45 AM [facility] [place] FLUSH [facility] None  05/23/2020  8:15 AM [facility] LAB 4 [facility] None  05/23/2020  8:45 AM [facility] [place] FLUSH [facility] None  06/19/2020  8:15 AM [facility] LAB 1 [facility] None  06/19/2020  8:45 AM [facility] [place] FLUSH [facility] None    No orders of the defined types were placed in this encounter.      Subjective:   Patient ID:  Robin Castillo is a 49 y.o. (DOB August 20, 1970) female.  Chief Complaint: No chief complaint on file.   HPI Robin Castillo is a 49 y.o. female with a diagnosis of an ER positive malignant neoplasm of the left breast. She is managed by Dr. [name] and is treated with Zoladex and anastrozole.  She presents to the clinic today with a report that she has bruising at the site of her last Zoladex injection and feels a small nodular density  at the site.  She is concerned that the Zoladex "pellet" was placed with in the subcutaneous tissue rather than with and the fat layer.  She is concerned that it is not being absorbed into her tissue as it should.  She is concerned that her hormonal levels may not be suppressed.  She reports that she has had spotting and has had an increased libido which is unusual.  An estradiol and epistasis are pending.   Medications: I have reviewed the patient's current medications.  Allergies: No Known Allergies  Past Medical History:  Diagnosis Date  . Arthritis    per patient new onset in finger joints  . BV (bacterial vaginosis) 2012  . Cancer ([place]) 2021   left breast   . Cyst of ovary, right 2003  . Family history of prostate cancer in father   . GBS carrier   . H/O rubella   . H/O varicella   . History of PCOS 06/2005  . Hypertension   . Infertility, female   . Irregular menses 11/2001  . Leukorrhea 04/2007   phsiological  . Pregnancy induced hypertension   . Psychosocial stressors 10/2009    Past Surgical History:  Procedure Laterality Date  . BREAST ENHANCEMENT SURGERY    . BREAST LUMPECTOMY WITH RADIOACTIVE SEED AND SENTINEL LYMPH NODE BIOPSY Left 08/21/2019   Procedure: LEFT BREAST LUMPECTOMY WITH RADIOACTIVE SEED AND LEFT AXILLARY SENTINEL LYMPH NODE BIOPSY;  Surgeon: [name], MD;  Location: [place];  Service: General;  Laterality: Left;  .  CESAREAN SECTION    . RE-EXCISION OF BREAST LUMPECTOMY Left 09/11/2019   Procedure: LEFT RE-EXCISION OF BREAST MARGIN;  Surgeon: [name], MD;  Location: [place];  Service: General;  Laterality: Left;    Family History  Problem Relation Age of Onset  . Heart disease Mother   . Hypertension Mother   . Heart disease Father   . Depression Father   . Alcohol abuse Father   . Prostate cancer Father 19  . Heart disease Maternal Grandmother   . Heart disease Maternal Grandfather      Social History   Socioeconomic History  . Marital status: Married    Spouse name: Not on file  . Number of children: Not on file  . Years of education: Not on file  . Highest education level: Not on file  Occupational History  . Not on file  Tobacco Use  . Smoking status: Never Smoker  . Smokeless tobacco: Never Used  Vaping Use  . Vaping Use: Never used  Substance and Sexual Activity  . Alcohol use: Not Currently    Comment: Glass of champagne once a month  . Drug use: No  . Sexual activity: Yes  Other Topics Concern  . Not on file  Social History Narrative  . Not on file   Social Determinants of Health   Financial Resource Strain:   . Difficulty of Paying Living Expenses:   Food Insecurity:   . Worried About Charity fundraiser in the Last Year:   . Arboriculturist in the Last Year:   Transportation Needs:   . Film/video editor (Medical):   Marland Kitchen Lack of Transportation (Non-Medical):   Physical Activity:   . Days of Exercise per Week:   . Minutes of Exercise per Session:   Stress:   . Feeling of Stress :   Social Connections:   . Frequency of Communication with Friends and Family:   . Frequency of Social Gatherings with Friends and Family:   . Attends Religious Services:   . Active Member of Clubs or Organizations:   . Attends Archivist Meetings:   Marland Kitchen Marital Status:   Intimate Partner Violence:   . Fear of Current or Ex-Partner:   . Emotionally Abused:   Marland Kitchen Physically Abused:   . Sexually Abused:     Past Medical History, Surgical history, Social history, and Family history were reviewed and updated as appropriate.   Please see review of systems for further details on the patient's review from today.   Review of Systems:  Review of Systems  Genitourinary: Positive for vaginal bleeding.       Increased libido  Skin:       Small pellet-like density is noted in the abdomen at the site of a recent Zoladex injection.  Hematological:  Bruises/bleeds easily.    Objective:   Physical Exam:  Pulse 65   Temp 98.4 F (36.9 C) (Oral)   Resp 17   Ht 5\' 5"  (1.651 m)   Wt 143 lb 3.2 oz (65 kg)   SpO2 100%   BMI 23.83 kg/m  ECOG: 0  Physical Exam Constitutional:      General: She is not in acute distress.    Appearance: Normal appearance. She is not ill-appearing, toxic-appearing or diaphoretic.  HENT:     Head: Normocephalic and atraumatic.  Abdominal:    Skin:    Findings: Bruising present.  Neurological:     Mental Status: She is  alert.  Psychiatric:        Mood and Affect: Mood normal.        Behavior: Behavior normal.        Thought Content: Thought content normal.        Judgment: Judgment normal.     Lab Review:     Component Value Date/Time   NA 141 01/16/2020 1232   K 3.5 01/16/2020 1232   CL 104 01/16/2020 1232   CO2 29 01/16/2020 1232   GLUCOSE 84 01/16/2020 1232   BUN 6 01/16/2020 1232   CREATININE 0.80 01/16/2020 1232   CREATININE 0.77 08/01/2019 1234   CALCIUM 10.2 01/16/2020 1232   PROT 7.3 01/16/2020 1232   ALBUMIN 4.2 01/16/2020 1232   AST 19 01/16/2020 1232   AST 15 08/01/2019 1234   ALT 21 01/16/2020 1232   ALT 13 08/01/2019 1234   ALKPHOS 71 01/16/2020 1232   BILITOT 0.4 01/16/2020 1232   BILITOT 0.3 08/01/2019 1234   GFRNONAA >60 01/16/2020 1232   GFRNONAA >60 08/01/2019 1234   GFRAA >60 01/16/2020 1232   GFRAA >60 08/01/2019 1234       Component Value Date/Time   WBC 3.1 (L) 01/16/2020 1232   RBC 4.95 01/16/2020 1232   HGB 12.5 01/16/2020 1232   HGB 11.2 (L) 08/01/2019 1234   HCT 40.2 01/16/2020 1232   PLT 219 01/16/2020 1232   PLT 313 08/01/2019 1234   MCV 81.2 01/16/2020 1232   MCH 25.3 (L) 01/16/2020 1232   MCHC 31.1 01/16/2020 1232   RDW 22.9 (H) 01/16/2020 1232   LYMPHSABS 1.0 01/16/2020 1232   MONOABS 0.2 01/16/2020 1232   EOSABS 0.0 01/16/2020 1232   BASOSABS 0.0 01/16/2020 1232   -------------------------------  Imaging from last 24 hours (if  applicable):  Radiology interpretation: No results found.

## 2020-01-21 ENCOUNTER — Telehealth: Payer: Self-pay | Admitting: *Deleted

## 2020-01-21 NOTE — Telephone Encounter (Signed)
This RN spoke with pt per her call due to noted continued elevation in BP readings now with dizziness.  Lynette states she started having symptoms relating to high blood pressure ( " genetically predisposed and have been on medication since I was 17") - with noted dizziness being more of a concern.  She started checking her bp at home and noted am elevations of 160/100 with one reading 160/108.  She usually takes lisinopril/hctz 10/12.5mg  daily in am.  She takes the anastrozole in the evening.  She contacted her primary MD per bp readings and was advised to " double up " on BP medication which she did over the weekend.  [facility] is concerned about why her BP is elevated and if this is a side effect of her current anti- estrogen medications- anastrozole and zoladex.  This RN discussed above - with current plan for her to take BP twice a day- for the " double dose " advice she received from her primary MD.  She will not change the anastrozole dosing at present.  This RN scheduled her for MD follow up prior to next scheduled injection for further discussion of current therapy.

## 2020-01-22 ENCOUNTER — Telehealth: Payer: Self-pay | Admitting: Radiation Oncology

## 2020-01-22 NOTE — Telephone Encounter (Signed)
  Radiation Oncology         [phone]) [phone] ________________________________  Name: Robin Castillo MRN: [id number]  Date of Service: 01/22/2020  DOB: [birth year]  Post Treatment Telephone Note  Diagnosis:   Stage IA, [id number] grade 2, ER/PR positive invasive ductal carcinoma of the left breast.  Interval Since Last Radiation: 10 weeks    10/17/19 - 11/13/19: The patient initially received a dose of 42.56 Gy in 16 fractions to the breast using whole-breast tangent fields. This was delivered using a 3-D conformal technique. The patient then received a boost to the seroma. This delivered an additional 10 Gy in 41fractions using an en face electron field due to the depth of the seroma. The total dose was 52.56Gy.  Narrative:  The patient was contacted today for routine follow-up. During treatment she did very well with radiotherapy and did not have significant desquamation of the breast but reports this was more noticeable and uncomfortable at the base of the axilla. She reports she is doing better and about 75% healed and the appearence is back to normal of the breast. She has had an episode of brown vaginal spotting x1, and has been having trouble with managing her BP levels and has been working with her PCP as well to manage this with medication.  Impression/Plan: 1. Stage IA, [id number] grade 2, ER/PR positive invasive ductal carcinoma of the left breast. The patient has been doing well since completion of radiotherapy. We discussed that we would be happy to continue to follow her as needed, but she will also continue to follow up with Dr. [name] in medical oncology. She was counseled on skin care as well as measures to avoid sun exposure to this area. She plans to meet with Dr. [name] next Tuesday to discuss her hormone test results, management of her blood pressure, and next steps to proceed with surveillance.     Carola Rhine, PAC

## 2020-01-24 ENCOUNTER — Other Ambulatory Visit: Payer: Self-pay | Admitting: Oncology

## 2020-01-28 ENCOUNTER — Telehealth: Payer: Self-pay | Admitting: *Deleted

## 2020-01-28 NOTE — Telephone Encounter (Signed)
Called to go over labs with pt. Received vm. Left message message advising pt that [facility] and Estradiol results were wnl and no problems noted. Advised to call office with any other questions or concerns.

## 2020-01-29 ENCOUNTER — Inpatient Hospital Stay: Payer: 59 | Attending: Hematology and Oncology | Admitting: Oncology

## 2020-01-29 ENCOUNTER — Other Ambulatory Visit: Payer: Self-pay

## 2020-01-29 VITALS — BP 147/103 | HR 78 | Temp 98.9°F | Resp 20 | Ht 65.0 in | Wt 140.2 lb

## 2020-01-29 DIAGNOSIS — Z5111 Encounter for antineoplastic chemotherapy: Secondary | ICD-10-CM | POA: Diagnosis not present

## 2020-01-29 DIAGNOSIS — C50412 Malignant neoplasm of upper-outer quadrant of left female breast: Secondary | ICD-10-CM | POA: Diagnosis not present

## 2020-01-29 DIAGNOSIS — Z8042 Family history of malignant neoplasm of prostate: Secondary | ICD-10-CM | POA: Insufficient documentation

## 2020-01-29 DIAGNOSIS — Z79899 Other long term (current) drug therapy: Secondary | ICD-10-CM | POA: Diagnosis not present

## 2020-01-29 DIAGNOSIS — Z170 Estrogen receptor positive status [ER+]: Secondary | ICD-10-CM | POA: Diagnosis not present

## 2020-01-29 DIAGNOSIS — Z818 Family history of other mental and behavioral disorders: Secondary | ICD-10-CM | POA: Insufficient documentation

## 2020-01-29 DIAGNOSIS — Z811 Family history of alcohol abuse and dependence: Secondary | ICD-10-CM | POA: Insufficient documentation

## 2020-01-29 DIAGNOSIS — Z79811 Long term (current) use of aromatase inhibitors: Secondary | ICD-10-CM | POA: Insufficient documentation

## 2020-01-29 DIAGNOSIS — Z8249 Family history of ischemic heart disease and other diseases of the circulatory system: Secondary | ICD-10-CM | POA: Insufficient documentation

## 2020-01-29 NOTE — Progress Notes (Signed)
[facility]  Telephone:([phone]) [phone] Fax:(336) [phone]     ID: Robin Castillo DOB: [birth year]  MR#: [id number]  YHC#:[id number]  Patient Care Team: [name], MD as PCP - General (Internal Medicine) Rockwell Germany, RN as Oncology Nurse Navigator Mauro Kaufmann, RN as Oncology Nurse Navigator Kyung Rudd, MD as Consulting Physician (Radiation Oncology) [name], MD as Consulting Physician (General Surgery) Deylan Canterbury, Virgie Dad, MD as Consulting Physician (Oncology) Earnstine Regal, PA-C as Consulting Physician (Obstetrics and Gynecology) Belva Crome, MD as Consulting Physician (Cardiology) [name], MD as Consulting Physician (Obstetrics and Gynecology) Chauncey Cruel, MD OTHER MD:  CHIEF COMPLAINT: Invasive ductal breast cancer, estrogen receptor positive  CURRENT TREATMENT: goserelin, anastrozole   INTERVAL HISTORY: Robin Castillo returns today for follow up of her estrogen receptor positive breast cancer.   She started anastrozole at her last visit on 11/27/2019.  She started Zoladex/goserelin 10/12/2019.  She had a heavier than usua as expected.  After the second dose, 11/09/2019, her periods stopped.  With the last dose she received however 01/02/2020 she had significant problems.  She says the pellet was inserted very slowly, she immediately felt a vein or artery had been injured, she had a very large hematoma, and when the hematoma was reabsorbed she could feel the pellet under the skin not being absorbed.  In fact she had some breakthrough bleeding.  This has caused her a great deal of distress and concern and she is strongly considering bilateral salpingo-oophorectomy.  She has discussed this with Dr. [name].   REVIEW OF SYSTEMS: [name] continues to work full-time and goes to [employer] all the time.  She also walks 45 to 60 minutes on days that she does not go to the gym.  A second problem is that she has noted increased blood pressure since starting  anastrozole and goserelin.  Her primary care physician increased her Zestoretic to twice daily and that has helped but she is still having elevated blood pressures in a range that she did not have before.  She wonders if the anastrozole or the goserelin could be responsible for this.  A detailed review of systems today was otherwise stable   HISTORY OF CURRENT ILLNESS: From the original intake note:  Robin Castillo presented with a palpable left breast lump. She underwent bilateral diagnostic mammography with tomography and left breast ultrasonography at [facility] on 07/25/2019 showing: breast density category B; mass with associated clustered calcifications (overall dimension up to 1.2 cm on mammogram and 0.9 cm on ultrasound) within upper-outer left breast at 1 o'clock; left axillary lymph node appears within normal limits.  Accordingly on 07/26/2019 she proceeded to biopsy of the left breast area in question. The pathology from this procedure ([id number]) showed: invasive ductal carcinoma, grade 2. Prognostic indicators significant for: estrogen receptor, 70% positive with moderate staining intensity and progesterone receptor, 95% positive with strong staining intensity. Proliferation marker Ki67 at 2%. HER2 negative by immunohistochemistry (1+).  The patient's subsequent history is as detailed below.   PAST MEDICAL HISTORY: Past Medical History:  Diagnosis Date  . Arthritis    per patient new onset in finger joints  . BV (bacterial vaginosis) 2012  . Cancer ([place]) 2021   left breast   . Cyst of ovary, right 2003  . Family history of prostate cancer in father   . GBS carrier   . H/O rubella   . H/O varicella   . History of PCOS 06/2005  . Hypertension   .  Infertility, female   . Irregular menses 11/2001  . Leukorrhea 04/2007   phsiological  . Pregnancy induced hypertension   . Psychosocial stressors 10/2009    PAST SURGICAL HISTORY: Past Surgical History:  Procedure  Laterality Date  . BREAST ENHANCEMENT SURGERY    . BREAST LUMPECTOMY WITH RADIOACTIVE SEED AND SENTINEL LYMPH NODE BIOPSY Left 08/21/2019   Procedure: LEFT BREAST LUMPECTOMY WITH RADIOACTIVE SEED AND LEFT AXILLARY SENTINEL LYMPH NODE BIOPSY;  Surgeon: [name], MD;  Location: [place] [place] [facility];  Service: General;  Laterality: Left;  . CESAREAN SECTION    . RE-EXCISION OF BREAST LUMPECTOMY Left 09/11/2019   Procedure: LEFT RE-EXCISION OF BREAST MARGIN;  Surgeon: [name], MD;  Location: [place];  Service: General;  Laterality: Left;    FAMILY HISTORY: Family History  Problem Relation Age of Onset  . Heart disease Mother   . Hypertension Mother   . Heart disease Father   . Depression Father   . Alcohol abuse Father   . Prostate cancer Father 93  . Heart disease Maternal Grandmother   . Heart disease Maternal Grandfather    Patient's father is 51 years old and patient's mother 42 years old (as of 07/2019) The patient denies a family hx of breast or ovarian cancer. She does report her father was recently diagnosed with prostate cancer. She has 1 sister.   GYNECOLOGIC HISTORY:  No LMP recorded. Menarche: 49 years old Age at first live birth: 49 years old [place] P 2 LMP 07/28/2019, periods are irregular Contraceptive: yes, used for more than 20 years HRT n/a  Hysterectomy? no BSO? 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 Husband Robin Castillo is a self-employed Forensic psychologist. She lives at home with her husband and two sons: Robin Castillo  14 and Robin Castillo. She attends a local [employer]   ADVANCED DIRECTIVES: In the absence of any documentation to the contrary, the patient's spouse is their HCPOA.    HEALTH MAINTENANCE: Social History   Tobacco Use  . Smoking status: Never Smoker  . Smokeless tobacco: Never Used  Vaping Use  . Vaping  Use: Never used  Substance Use Topics  . Alcohol use: Not Currently    Comment: Glass of champagne once a month  . Drug use: No     Colonoscopy: n/a (age)  PAP: 06/2018  Bone density: n/a (age)   No Known Allergies  Current Outpatient Medications  Medication Sig Dispense Refill  . anastrozole (ARIMIDEX) 1 MG tablet Take 1 tablet (1 mg total) by mouth daily. 90 tablet 0  . diclofenac (VOLTAREN) 75 MG EC tablet Take 75 mg by mouth daily as needed for moderate pain.    Marland Kitchen lidocaine-prilocaine (EMLA) cream Apply 1 application topically as needed. Use as directed 1 hour prior to injection 30 g 0  . lisinopril-hydrochlorothiazide (PRINZIDE,ZESTORETIC) 10-12.5 MG per tablet Take 1 tablet by mouth daily.    Marland Kitchen oxyCODONE (OXY IR/ROXICODONE) 5 MG immediate release tablet Take 1 tablet (5 mg total) by mouth every 6 (six) hours as needed for moderate pain, severe pain or breakthrough pain. (Patient not taking: Reported on 10/09/2019) 10 tablet 0   No current facility-administered medications for this visit.    OBJECTIVE:  Native American woman in no acute distress  Vitals:   01/29/20 1604  BP: (!) 147/103  Pulse: 78  Resp: 20  Temp: 98.9 F (37.2 C)  SpO2: 100%     Body mass index is 23.33 kg/m.   Wt Readings from Last 3 Encounters:  01/29/20 140 lb 3.2 oz (63.6 kg)  01/16/20 143 lb 3.2 oz (65 kg)  11/27/19 142 lb 11.2 oz (64.7 kg)      ECOG FS:1 - Symptomatic but completely ambulatory  Sclerae unicteric, EOMs intact Wearing a mask No cervical or supraclavicular adenopathy Lungs no rales or rhonchi Heart regular rate and rhythm Abd soft, nontender, positive bowel sounds MSK no focal spinal tenderness, no upper extremity lymphedema Neuro: nonfocal, well oriented, appropriate affect Breasts: The right breast is unremarkable.  The left breast is status post lumpectomy and radiation with no evidence of local recurrence.  Both axillae are benign.  LAB RESULTS:  CMP     Component  Value Date/Time   NA 141 01/16/2020 1232   K 3.5 01/16/2020 1232   CL 104 01/16/2020 1232   CO2 29 01/16/2020 1232   GLUCOSE 84 01/16/2020 1232   BUN 6 01/16/2020 1232   CREATININE 0.80 01/16/2020 1232   CREATININE 0.77 08/01/2019 1234   CALCIUM 10.2 01/16/2020 1232   PROT 7.3 01/16/2020 1232   ALBUMIN 4.2 01/16/2020 1232   AST 19 01/16/2020 1232   AST 15 08/01/2019 1234   ALT 21 01/16/2020 1232   ALT 13 08/01/2019 1234   ALKPHOS 71 01/16/2020 1232   BILITOT 0.4 01/16/2020 1232   BILITOT 0.3 08/01/2019 1234   GFRNONAA >60 01/16/2020 1232   GFRNONAA >60 08/01/2019 1234   GFRAA >60 01/16/2020 1232   GFRAA >60 08/01/2019 1234    No results found for: TOTALPROTELP, ALBUMINELP, A1GS, A2GS, BETS, BETA2SER, GAMS, MSPIKE, SPEI  Lab Results  Component Value Date   WBC 3.1 (L) 01/16/2020   NEUTROABS 1.8 01/16/2020   HGB 12.5 01/16/2020   HCT 40.2 01/16/2020   MCV 81.2 01/16/2020   PLT 219 01/16/2020    No results found for: LABCA2  No components found for: [id number]  No results for input(s): INR in the last 168 hours.  No results found for: LABCA2  No results found for: EKC003  No results found for: KJZ791  No results found for: TAV697  No results found for: CA2729  No components found for: HGQUANT  No results found for: CEA1 / No results found for: CEA1   No results found for: AFPTUMOR  No results found for: CHROMOGRNA  No results found for: KPAFRELGTCHN, LAMBDASER, KAPLAMBRATIO (kappa/lambda light chains)  No results found for: HGBA, HGBA2QUANT, HGBFQUANT, HGBSQUAN (Hemoglobinopathy evaluation)   No results found for: LDH  No results found for: IRON, TIBC, IRONPCTSAT (Iron and TIBC)  No results found for: FERRITIN  Urinalysis No results found for: COLORURINE, APPEARANCEUR, LABSPEC, PHURINE, GLUCOSEU, HGBUR, BILIRUBINUR, KETONESUR, PROTEINUR, UROBILINOGEN, NITRITE, LEUKOCYTESUR   STUDIES: No results found.   ELIGIBLE FOR AVAILABLE RESEARCH  PROTOCOL: no  ASSESSMENT: 49 y.o. [place] woman status post left breast upper outer quadrant biopsy 07/26/2019 for a clinical T1b-c N0, stage Ia invasive ductal carcinoma, grade 2, estrogen and progesterone receptor positive, HER-2 not amplified, with an MIB-1 of 2  (1) status post left lumpectomy and sentinel lymph node sampling 08/21/2019 for a pT1c pN1(mic), 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  (a) a single sentinel lymph node was removed  (b) additional surgery 09/11/2019 cleared the margins  (2) Oncotype score of 16 predicts a risk of recurrence outside the breast  in the next 9 years of 15% if the patient's only systemic therapy is tamoxifen for 5 years.  It also predicts no apparent benefit from chemotherapy.  (3) adjuvant radiation 10/17/2019 through 11/13/2019  (4) antiestrogens for a minimum of 5 years:  (a) goserelin/Zoladex started 10/12/2019  (b) anastrozole started 11/27/2019  (5) genetics testing 08/09/2019 through the [facility] Breast Cancer STAT and Common Hereditary Cancers panels found no deleterious mutations in ATM, BRCA1, BRCA2, CDH1, CHEK2, PALB2, PTEN, STK11 and TP53.  APC, ATM, AXIN2, BARD1, BMPR1A, BRCA1, BRCA2, BRIP1, CDH1, CDK4, CDKN2A (p14ARF), CDKN2A (p16INK4a), CHEK2, CTNNA1, DICER1, EPCAM (Deletion/duplication testing only), GREM1 (promoter region deletion/duplication testing only), KIT, MEN1, MLH1, MSH2, MSH3, MSH6, MUTYH, NBN, NF1, NHTL1, PALB2, PDGFRA, PMS2, POLD1, POLE, PTEN, RAD50, RAD51C, RAD51D, RNF43, SDHB, SDHC, SDHD, SMAD4, SMARCA4. STK11, TP53, TSC1, TSC2, and VHL.  The following genes were evaluated for sequence changes only: SDHA and HOXB13 c.251G>A variant only.  (a) A variant of uncertain significance was detected in the MSH3 gene called c.803G>A (p.Arg268Gln). The report date is 08/09/2019.   PLAN: Jilliane had some breakthrough menstrual bleeding despite the goserelin and we obtained an [facility] and estradiol  today.  These labs are very reassuring: The [facility] is suppressed and the estradiol is very very low.  This is what one expects from goserelin.  Nevertheless she found the last goserelin dose so traumatic she is considering removing her ovaries.  She understands in terms of effectiveness against breast cancer there is no real difference between these 2 options.  Certainly having the ovaries removed would be the easier way to go in terms of not having to have shots every month which can be so uncomfortable.    She specifically requested one of our nurses to give her the shot this week and I have placed a request with our nursing supervisors to see if that can be arranged.  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  In my experience anastrozole/goserelin does not usually increase blood pressure, but it is a possibility and we discussed going off the goserelin now. What she would prefer to do is to receive the dose this week as scheduled, giving her more time to plan her BSO for next month.  Tentatively she will return to see me mid Sept; she knows to call for any issues that may develop before then  Total encounter time 25 minutes.Chauncey Cruel, MD   01/29/2020 6:06 PM Medical Oncology and Hematology [facility] [place], [place] [address] Tel. [phone]    Fax. [phone]   This document serves as a record of services personally performed by Lurline Del, MD. It was created on his behalf by Wilburn Mylar, a trained medical scribe. The creation of this record is based on the scribe's personal observations and the provider's statements to them.   I, Lurline Del MD, have reviewed the above documentation for accuracy and completeness, and I agree with the above.   *Total  Encounter Time as defined by the Centers for Medicare and Medicaid Services includes, in addition to the face-to-face time of a patient visit (documented in the note above) non-face-to-face time: obtaining and reviewing outside history, ordering and reviewing medications, tests or procedures, care coordination (communications with other health care professionals or caregivers) and documentation in the  medical record.

## 2020-01-30 ENCOUNTER — Telehealth: Payer: Self-pay | Admitting: Oncology

## 2020-01-30 NOTE — Telephone Encounter (Signed)
Pt already had appts for injections and labs scheduled from previous LOS. Added office visit and labs per 8/3 los. Left voicemail with appt dates and times.

## 2020-02-01 ENCOUNTER — Other Ambulatory Visit: Payer: Self-pay

## 2020-02-01 ENCOUNTER — Other Ambulatory Visit: Payer: Self-pay | Admitting: *Deleted

## 2020-02-01 ENCOUNTER — Other Ambulatory Visit: Payer: BC Managed Care – PPO

## 2020-02-01 ENCOUNTER — Inpatient Hospital Stay: Payer: 59

## 2020-02-01 VITALS — BP 146/95 | HR 78 | Temp 98.2°F | Resp 20

## 2020-02-01 DIAGNOSIS — C50412 Malignant neoplasm of upper-outer quadrant of left female breast: Secondary | ICD-10-CM

## 2020-02-01 DIAGNOSIS — Z170 Estrogen receptor positive status [ER+]: Secondary | ICD-10-CM

## 2020-02-01 DIAGNOSIS — Z5111 Encounter for antineoplastic chemotherapy: Secondary | ICD-10-CM | POA: Diagnosis not present

## 2020-02-01 MED ORDER — GOSERELIN ACETATE 3.6 MG ~~LOC~~ IMPL
DRUG_IMPLANT | SUBCUTANEOUS | Status: AC
  Filled 2020-02-01: qty 3.6

## 2020-02-01 MED ORDER — GOSERELIN ACETATE 3.6 MG ~~LOC~~ IMPL: 3.6000 mg | DRUG_IMPLANT | Freq: Once | SUBCUTANEOUS | Status: DC

## 2020-02-01 MED ORDER — GOSERELIN ACETATE 3.6 MG ~~LOC~~ IMPL
3.6000 mg | DRUG_IMPLANT | Freq: Once | SUBCUTANEOUS | Status: AC
  Administered 2020-02-01: 3.6 mg via SUBCUTANEOUS

## 2020-02-01 NOTE — Addendum Note (Signed)
Addended by: Dicie Beam D on: 02/01/2020 09:28 AM   Modules accepted: Orders

## 2020-02-01 NOTE — Patient Instructions (Signed)

## 2020-02-11 ENCOUNTER — Other Ambulatory Visit: Payer: Self-pay

## 2020-02-14 ENCOUNTER — Other Ambulatory Visit: Payer: Self-pay | Admitting: *Deleted

## 2020-02-14 MED ORDER — ANASTROZOLE 1 MG PO TABS: 1.0000 mg | ORAL_TABLET | Freq: Every day | ORAL | 4 refills | Status: DC

## 2020-02-29 ENCOUNTER — Other Ambulatory Visit: Payer: Self-pay | Admitting: Obstetrics and Gynecology

## 2020-02-29 ENCOUNTER — Other Ambulatory Visit: Payer: Self-pay

## 2020-02-29 ENCOUNTER — Inpatient Hospital Stay: Payer: 59 | Attending: Hematology and Oncology

## 2020-02-29 ENCOUNTER — Inpatient Hospital Stay: Payer: 59

## 2020-02-29 ENCOUNTER — Other Ambulatory Visit: Payer: Self-pay | Admitting: Hematology and Oncology

## 2020-02-29 VITALS — BP 148/106 | HR 61 | Temp 98.7°F

## 2020-02-29 DIAGNOSIS — R232 Flushing: Secondary | ICD-10-CM | POA: Diagnosis not present

## 2020-02-29 DIAGNOSIS — Z170 Estrogen receptor positive status [ER+]: Secondary | ICD-10-CM | POA: Diagnosis not present

## 2020-02-29 DIAGNOSIS — L409 Psoriasis, unspecified: Secondary | ICD-10-CM | POA: Insufficient documentation

## 2020-02-29 DIAGNOSIS — Z79899 Other long term (current) drug therapy: Secondary | ICD-10-CM | POA: Insufficient documentation

## 2020-02-29 DIAGNOSIS — Z79811 Long term (current) use of aromatase inhibitors: Secondary | ICD-10-CM | POA: Diagnosis not present

## 2020-02-29 DIAGNOSIS — Z818 Family history of other mental and behavioral disorders: Secondary | ICD-10-CM | POA: Insufficient documentation

## 2020-02-29 DIAGNOSIS — C50412 Malignant neoplasm of upper-outer quadrant of left female breast: Secondary | ICD-10-CM | POA: Insufficient documentation

## 2020-02-29 DIAGNOSIS — Z5111 Encounter for antineoplastic chemotherapy: Secondary | ICD-10-CM | POA: Diagnosis not present

## 2020-02-29 DIAGNOSIS — Z811 Family history of alcohol abuse and dependence: Secondary | ICD-10-CM | POA: Insufficient documentation

## 2020-02-29 DIAGNOSIS — Z8249 Family history of ischemic heart disease and other diseases of the circulatory system: Secondary | ICD-10-CM | POA: Diagnosis not present

## 2020-02-29 DIAGNOSIS — Z8042 Family history of malignant neoplasm of prostate: Secondary | ICD-10-CM | POA: Insufficient documentation

## 2020-02-29 LAB — COMPREHENSIVE METABOLIC PANEL
ALT: 12 U/L (ref 0–44)
AST: 16 U/L (ref 15–41)
Albumin: 4 g/dL (ref 3.5–5.0)
Alkaline Phosphatase: 67 U/L (ref 38–126)
Anion gap: 7 (ref 5–15)
BUN: 5 mg/dL — ABNORMAL LOW (ref 6–20)
CO2: 31 mmol/L (ref 22–32)
Calcium: 9.4 mg/dL (ref 8.9–10.3)
Chloride: 102 mmol/L (ref 98–111)
Creatinine, Ser: 0.75 mg/dL (ref 0.44–1.00)
GFR calc Af Amer: >60 mL/min (ref >60)
GFR calc non Af Amer: >60 mL/min (ref >60)
Glucose, Bld: 74 mg/dL (ref 70–99)
Potassium: 3.3 mmol/L — ABNORMAL LOW (ref 3.5–5.1)
Sodium: 140 mmol/L (ref 135–145)
Total Bilirubin: 0.5 mg/dL (ref 0.3–1.2)
Total Protein: 7 g/dL (ref 6.5–8.1)

## 2020-02-29 LAB — CBC WITH DIFFERENTIAL/PLATELET
Abs Immature Granulocytes: 0.01 10*3/uL (ref 0.00–0.07)
Basophils Absolute: 0 10*3/uL (ref 0.0–0.1)
Basophils Relative: 1 %
Eosinophils Absolute: 0 10*3/uL (ref 0.0–0.5)
Eosinophils Relative: 1 %
HCT: 41 % (ref 36.0–46.0)
Hemoglobin: 13 g/dL (ref 12.0–15.0)
Immature Granulocytes: 0 %
Lymphocytes Relative: 36 %
Lymphs Abs: 1 10*3/uL (ref 0.7–4.0)
MCH: 26.9 pg (ref 26.0–34.0)
MCHC: 31.7 g/dL (ref 30.0–36.0)
MCV: 84.9 fL (ref 80.0–100.0)
Monocytes Absolute: 0.2 10*3/uL (ref 0.1–1.0)
Monocytes Relative: 8 %
Neutro Abs: 1.6 10*3/uL — ABNORMAL LOW (ref 1.7–7.7)
Neutrophils Relative %: 54 %
Platelets: 204 10*3/uL (ref 150–400)
RBC: 4.83 MIL/uL (ref 3.87–5.11)
RDW: 15.8 % — ABNORMAL HIGH (ref 11.5–15.5)
WBC: 2.9 10*3/uL — ABNORMAL LOW (ref 4.0–10.5)
nRBC: 0 % (ref 0.0–0.2)

## 2020-02-29 MED ORDER — GOSERELIN ACETATE 3.6 MG ~~LOC~~ IMPL
3.6000 mg | DRUG_IMPLANT | Freq: Once | SUBCUTANEOUS | Status: AC
  Administered 2020-02-29: 3.6 mg via SUBCUTANEOUS

## 2020-02-29 MED ORDER — GOSERELIN ACETATE 3.6 MG ~~LOC~~ IMPL
DRUG_IMPLANT | SUBCUTANEOUS | Status: AC
  Filled 2020-02-29: qty 3.6

## 2020-02-29 NOTE — Patient Instructions (Signed)

## 2020-03-14 ENCOUNTER — Inpatient Hospital Stay: Payer: 59

## 2020-03-14 ENCOUNTER — Inpatient Hospital Stay (HOSPITAL_BASED_OUTPATIENT_CLINIC_OR_DEPARTMENT_OTHER): Payer: 59 | Admitting: Oncology

## 2020-03-14 ENCOUNTER — Other Ambulatory Visit: Payer: Self-pay

## 2020-03-14 VITALS — BP 148/100 | HR 71 | Temp 97.0°F | Resp 18 | Ht 65.0 in | Wt 141.1 lb

## 2020-03-14 DIAGNOSIS — C50412 Malignant neoplasm of upper-outer quadrant of left female breast: Secondary | ICD-10-CM

## 2020-03-14 DIAGNOSIS — Z5111 Encounter for antineoplastic chemotherapy: Secondary | ICD-10-CM | POA: Diagnosis not present

## 2020-03-14 DIAGNOSIS — Z170 Estrogen receptor positive status [ER+]: Secondary | ICD-10-CM

## 2020-03-14 LAB — COMPREHENSIVE METABOLIC PANEL
ALT: 16 U/L (ref 0–44)
AST: 17 U/L (ref 15–41)
Albumin: 4.2 g/dL (ref 3.5–5.0)
Alkaline Phosphatase: 68 U/L (ref 38–126)
Anion gap: 8 (ref 5–15)
BUN: 6 mg/dL (ref 6–20)
CO2: 30 mmol/L (ref 22–32)
Calcium: 9.8 mg/dL (ref 8.9–10.3)
Chloride: 102 mmol/L (ref 98–111)
Creatinine, Ser: 0.77 mg/dL (ref 0.44–1.00)
GFR calc Af Amer: >60 mL/min (ref >60)
GFR calc non Af Amer: >60 mL/min (ref >60)
Glucose, Bld: 80 mg/dL (ref 70–99)
Potassium: 3.7 mmol/L (ref 3.5–5.1)
Sodium: 140 mmol/L (ref 135–145)
Total Bilirubin: 0.4 mg/dL (ref 0.3–1.2)
Total Protein: 7.4 g/dL (ref 6.5–8.1)

## 2020-03-14 LAB — CBC WITH DIFFERENTIAL/PLATELET
Abs Immature Granulocytes: 0 10*3/uL (ref 0.00–0.07)
Basophils Absolute: 0 10*3/uL (ref 0.0–0.1)
Basophils Relative: 1 %
Eosinophils Absolute: 0 10*3/uL (ref 0.0–0.5)
Eosinophils Relative: 1 %
HCT: 42.5 % (ref 36.0–46.0)
Hemoglobin: 13.8 g/dL (ref 12.0–15.0)
Immature Granulocytes: 0 %
Lymphocytes Relative: 36 %
Lymphs Abs: 1.3 10*3/uL (ref 0.7–4.0)
MCH: 27.9 pg (ref 26.0–34.0)
MCHC: 32.5 g/dL (ref 30.0–36.0)
MCV: 86 fL (ref 80.0–100.0)
Monocytes Absolute: 0.3 10*3/uL (ref 0.1–1.0)
Monocytes Relative: 8 %
Neutro Abs: 2 10*3/uL (ref 1.7–7.7)
Neutrophils Relative %: 54 %
Platelets: 215 10*3/uL (ref 150–400)
RBC: 4.94 MIL/uL (ref 3.87–5.11)
RDW: 14 % (ref 11.5–15.5)
WBC: 3.6 10*3/uL — ABNORMAL LOW (ref 4.0–10.5)
nRBC: 0 % (ref 0.0–0.2)

## 2020-03-14 NOTE — Progress Notes (Signed)
Robin Castillo  [phone] [phone]) [phone]     ID: [name] DOB: [birth year]  MR#: [id number]  TZG#:[id number]  Patient Care Team: [name], MD as PCP - General (Internal Medicine) Rockwell Germany, RN as Oncology Nurse Navigator Mauro Kaufmann, RN as Oncology Nurse Navigator Kyung Rudd, MD as Consulting Physician (Radiation Oncology) [name], MD as Consulting Physician (General Surgery) Loui Massenburg, Virgie Dad, MD as Consulting Physician (Oncology) Earnstine Regal, PA-C as Consulting Physician (Obstetrics and Gynecology) Belva Crome, MD as Consulting Physician (Cardiology) [name], MD as Consulting Physician (Obstetrics and Gynecology) Chauncey Cruel, MD OTHER MD:  CHIEF COMPLAINT: Invasive ductal breast cancer, estrogen receptor positive  CURRENT TREATMENT: goserelin, anastrozole   INTERVAL HISTORY: [name] returns today for follow up of her estrogen receptor positive breast cancer.   She started anastrozole on 11/27/2019.  She is tolerating this well and in particular has had no further problems with the injection itself.  She started Zoladex/goserelin 10/12/2019.  Her most recent dose was 02/29/2020.  She is now functionally postmenopausal and is tolerating that well.  Her hot flashes have decreased.  She tells me she is scheduled for bilateral salpingo-oophorectomy under Dr. [name] 03/20/2020 so no further shots will be scheduled.   REVIEW OF SYSTEMS: [name] is working very hard at her job and really enjoys it.  She tells me coming here depresses her and she prays for all the patients she sees who are so much worse off than she has.  This increases her blood pressure.  She tells me that Dr. [name] recently her blood pressure was 116/70.  Aside from these issues a detailed review of systems today was stable   HISTORY OF CURRENT ILLNESS: From the original intake note:  Robin Castillo presented with a palpable left breast  lump. She underwent bilateral diagnostic mammography with tomography and left breast ultrasonography at [facility] on 07/25/2019 showing: breast density category B; mass with associated clustered calcifications (overall dimension up to 1.2 cm on mammogram and 0.9 cm on ultrasound) within upper-outer left breast at 1 o'clock; left axillary lymph node appears within normal limits.  Accordingly on 07/26/2019 she proceeded to biopsy of the left breast area in question. The pathology from this procedure ([id number]) showed: invasive ductal carcinoma, grade 2. Prognostic indicators significant for: estrogen receptor, 70% positive with moderate staining intensity and progesterone receptor, 95% positive with strong staining intensity. Proliferation marker Ki67 at 2%. HER2 negative by immunohistochemistry (1+).  The patient's subsequent history is as detailed below.   PAST MEDICAL HISTORY: Past Medical History:  Diagnosis Date  . Arthritis    per patient new onset in finger joints  . BV (bacterial vaginosis) 2012  . Cancer ([place]) 2021   left breast   . Cyst of ovary, right 2003  . Family history of prostate cancer in father   . GBS carrier   . H/O rubella   . H/O varicella   . History of PCOS 06/2005  . Hypertension   . Infertility, female   . Irregular menses 11/2001  . Leukorrhea 04/2007   phsiological  . Pregnancy induced hypertension   . Psychosocial stressors 10/2009    PAST SURGICAL HISTORY: Past Surgical History:  Procedure Laterality Date  . BREAST ENHANCEMENT SURGERY    . BREAST LUMPECTOMY WITH RADIOACTIVE SEED AND SENTINEL LYMPH NODE BIOPSY Left 08/21/2019   Procedure: LEFT BREAST LUMPECTOMY WITH RADIOACTIVE SEED AND LEFT AXILLARY SENTINEL LYMPH NODE BIOPSY;  Surgeon: [name], MD;  Location: [place];  Service: General;  Laterality: Left;  . CESAREAN SECTION    . RE-EXCISION OF BREAST LUMPECTOMY Left 09/11/2019   Procedure: LEFT RE-EXCISION OF  BREAST MARGIN;  Surgeon: [name], MD;  Location: [place];  Service: General;  Laterality: Left;    FAMILY HISTORY: Family History  Problem Relation Age of Onset  . Heart disease Mother   . Hypertension Mother   . Heart disease Father   . Depression Father   . Alcohol abuse Father   . Prostate cancer Father 94  . Heart disease Maternal Grandmother   . Heart disease Maternal Grandfather    Patient's father is 78 years old and patient's mother 23 years old (as of 07/2019) The patient denies a family hx of breast or ovarian cancer. She does report her father was recently diagnosed with prostate cancer. She has 1 sister.   GYNECOLOGIC HISTORY:  No LMP recorded. Menarche: 49 years old Age at first live birth: 49 years old [place] P 2 LMP 07/28/2019, periods are irregular Contraceptive: yes, used for more than 20 years HRT n/a  Hysterectomy? no BSO? no   SOCIAL HISTORY: (updated 11/2019)  [name] is of [place] extraction.  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 Husband [name] is a self-employed Forensic psychologist. She lives at home with her husband and two sons: [name]  14 and [name]. She attends a local [employer]   ADVANCED DIRECTIVES: In the absence of any documentation to the contrary, the patient's spouse is their HCPOA.    HEALTH MAINTENANCE: Social History   Tobacco Use  . Smoking status: Never Smoker  . Smokeless tobacco: Never Used  Vaping Use  . Vaping Use: Never used  Substance Use Topics  . Alcohol use: Not Currently    Comment: Glass of champagne once a month  . Drug use: No     Colonoscopy: n/a (age)  PAP: 06/2018  Bone density: n/a (age)   No Known Allergies  Current Outpatient Medications  Medication Sig Dispense Refill  . anastrozole (ARIMIDEX) 1 MG tablet Take 1 tablet (1 mg total) by mouth daily. 90 tablet 4  . diclofenac (VOLTAREN) 75 MG EC tablet Take  75 mg by mouth daily as needed for moderate pain.    Marland Kitchen lidocaine-prilocaine (EMLA) cream Apply 1 application topically as needed. Use as directed 1 hour prior to injection 30 g 0  . lisinopril-hydrochlorothiazide (PRINZIDE,ZESTORETIC) 10-12.5 MG per tablet Take 1 tablet by mouth daily.    Marland Kitchen oxyCODONE (OXY IR/ROXICODONE) 5 MG immediate release tablet Take 1 tablet (5 mg total) by mouth every 6 (six) hours as needed for moderate pain, severe pain or breakthrough pain. (Patient not taking: Reported on 10/09/2019) 10 tablet 0   No current facility-administered medications for this visit.    OBJECTIVE: Lung BP woman who appears well  Vitals:   03/14/20 1107  BP: (!) 148/100  Pulse: 71  Resp: 18  Temp: (!) 97 F (36.1 C)  SpO2: 100%     Body mass index is 23.48 kg/m.   Wt Readings from Last 3 Encounters:  03/14/20 141 lb 1.6 oz (64 kg)  01/29/20 140 lb 3.2 oz (63.6 kg)  01/16/20 143 lb 3.2 oz (65 kg)      ECOG FS:1 - Symptomatic but completely ambulatory  Sclerae unicteric, EOMs intact Wearing a mask No cervical or supraclavicular  adenopathy Lungs no rales or rhonchi Heart regular rate and rhythm Abd soft, nontender, positive bowel sounds MSK no focal spinal tenderness, no upper extremity lymphedema Neuro: nonfocal, well oriented, appropriate affect Breasts: The right breast is benign.  On the left the patient is status post lumpectomy and radiation with no evidence of local recurrence.  Both axillae are benign.   LAB RESULTS:  CMP     Component Value Date/Time   NA 140 03/14/2020 1053   K 3.7 03/14/2020 1053   CL 102 03/14/2020 1053   CO2 30 03/14/2020 1053   GLUCOSE 80 03/14/2020 1053   BUN 6 03/14/2020 1053   CREATININE 0.77 03/14/2020 1053   CREATININE 0.77 08/01/2019 1234   CALCIUM 9.8 03/14/2020 1053   PROT 7.4 03/14/2020 1053   ALBUMIN 4.2 03/14/2020 1053   AST 17 03/14/2020 1053   AST 15 08/01/2019 1234   ALT 16 03/14/2020 1053   ALT 13 08/01/2019 1234    ALKPHOS 68 03/14/2020 1053   BILITOT 0.4 03/14/2020 1053   BILITOT 0.3 08/01/2019 1234   GFRNONAA >60 03/14/2020 1053   GFRNONAA >60 08/01/2019 1234   GFRAA >60 03/14/2020 1053   GFRAA >60 08/01/2019 1234    No results found for: TOTALPROTELP, ALBUMINELP, A1GS, A2GS, BETS, BETA2SER, GAMS, MSPIKE, SPEI  Lab Results  Component Value Date   WBC 3.6 (L) 03/14/2020   NEUTROABS 2.0 03/14/2020   HGB 13.8 03/14/2020   HCT 42.5 03/14/2020   MCV 86.0 03/14/2020   PLT 215 03/14/2020    No results found for: LABCA2  No components found for: [id number]  No results for input(s): INR in the last 168 hours.  No results found for: LABCA2  No results found for: HWT888  No results found for: KCM034  No results found for: JZP915  No results found for: CA2729  No components found for: HGQUANT  No results found for: CEA1 / No results found for: CEA1   No results found for: AFPTUMOR  No results found for: CHROMOGRNA  No results found for: KPAFRELGTCHN, LAMBDASER, KAPLAMBRATIO (kappa/lambda light chains)  No results found for: HGBA, HGBA2QUANT, HGBFQUANT, HGBSQUAN (Hemoglobinopathy evaluation)   No results found for: LDH  No results found for: IRON, TIBC, IRONPCTSAT (Iron and TIBC)  No results found for: FERRITIN  Urinalysis No results found for: COLORURINE, APPEARANCEUR, LABSPEC, PHURINE, GLUCOSEU, HGBUR, BILIRUBINUR, KETONESUR, PROTEINUR, UROBILINOGEN, NITRITE, LEUKOCYTESUR   STUDIES: No results found.   ELIGIBLE FOR AVAILABLE RESEARCH PROTOCOL: no  ASSESSMENT: 49 y.o. [place] woman status post left breast upper outer quadrant biopsy 07/26/2019 for a clinical T1b-c N0, stage Ia invasive ductal carcinoma, grade 2, estrogen and progesterone receptor positive, HER-2 not amplified, with an MIB-1 of 2  (1) status post left lumpectomy and sentinel lymph node sampling 08/21/2019 for a pT1c pN1(mic), stage IA invasive ductal carcinoma grade 2, with a positive  anterior/inferior margin.  (a) a single sentinel lymph node was removed  (b) additional surgery 09/11/2019 cleared the margins  (2) Oncotype score of 16 predicts a risk of recurrence outside the breast in the next 9 years of 15% if the patient's only systemic therapy is tamoxifen for 5 years.  It also predicts no apparent benefit from chemotherapy.  (3) adjuvant radiation 10/17/2019 through 11/13/2019 Site/dose:   The patient initially received a dose of 42.56 Gy in 16 fractions to the breast using whole-breast tangent fields. This was delivered using a 3-D conformal technique. The patient then received a boost to the seroma. This  delivered an additional 10 Gy in 28factions using an en face electron field due to the depth of the seroma. The total dose was 52.56Gy.  (4) antiestrogens for a minimum of 5 years:  (a) goserelin/Zoladex started 10/12/2019, discontinued after 02/29/2020 dose  (b) anastrozole started 11/27/2019  (c) bilateral salpingo-oophorectomy 03/20/2020  (5) genetics testing 08/09/2019 through the [facility] Breast Cancer STAT and Common Hereditary Cancers panels found no deleterious mutations in ATM, BRCA1, BRCA2, CDH1, CHEK2, PALB2, PTEN, STK11 and TP53.  APC, ATM, AXIN2, BARD1, BMPR1A, BRCA1, BRCA2, BRIP1, CDH1, CDK4, CDKN2A (p14ARF), CDKN2A (p16INK4a), CHEK2, CTNNA1, DICER1, EPCAM (Deletion/duplication testing only), GREM1 (promoter region deletion/duplication testing only), KIT, MEN1, MLH1, MSH2, MSH3, MSH6, MUTYH, NBN, NF1, NHTL1, PALB2, PDGFRA, PMS2, POLD1, POLE, PTEN, RAD50, RAD51C, RAD51D, RNF43, SDHB, SDHC, SDHD, SMAD4, SMARCA4. STK11, TP53, TSC1, TSC2, and VHL.  The following genes were evaluated for sequence changes only: SDHA and HOXB13 c.251G>A variant only.  (a) A variant of uncertain significance was detected in the MSH3 gene called c.803G>A (p.Arg268Gln). The report date is 08/09/2019.   PLAN: EShalonis tolerating menopause well and she is planning on bilateral  salpingo-oophorectomy next week which will make that permanent.  The plan then is to continue anastrozole and minimum of 5 years  She is due for a bone density and she will set that up through Dr. RBoyd Kerbsoffice.  She requested thyroid labs at the next visit and I am glad to obtain that for her.  She has mild psoriasis.  This may be causing some arthritis as well.  I do not think the anastrozole is involved in that.  She has an excellent exercise program.  I commended that  She will see me again in December.  At that point likely we will initiate long-term follow-up  Total encounter time 25 minutes.*Chauncey Cruel MD   03/15/2020 8:03 AM Medical Oncology and [facility] [place] [place] [address]Tel. [phone]   Fax. [phone]  This document serves as a record of services personally performed by GLurline Del MD. It was created on his behalf by KWilburn Mylar a trained medical scribe. The creation of this record is based on the scribe's personal observations and the provider's statements to them.   I, GLurline DelMD, have reviewed the above documentation for accuracy and completeness, and I agree with the above.   *Total Encounter Time as defined by the Centers for Medicare and Medicaid Services includes, in addition to the face-to-face time of a patient visit (documented in the note above) non-face-to-face time: obtaining and reviewing outside history, ordering and reviewing medications, tests or procedures, care coordination (communications with other health care professionals or caregivers) and documentation in the medical record.

## 2020-03-17 ENCOUNTER — Telehealth: Payer: Self-pay | Admitting: *Deleted

## 2020-03-17 ENCOUNTER — Other Ambulatory Visit (HOSPITAL_COMMUNITY)
Admission: RE | Admit: 2020-03-17 | Discharge: 2020-03-17 | Disposition: A | Discharge status: Home / Self Care | Payer: 59 | Source: Ambulatory Visit | Attending: Obstetrics and Gynecology | Admitting: Obstetrics and Gynecology

## 2020-03-17 ENCOUNTER — Encounter (HOSPITAL_BASED_OUTPATIENT_CLINIC_OR_DEPARTMENT_OTHER): Payer: Self-pay | Admitting: Obstetrics and Gynecology

## 2020-03-17 ENCOUNTER — Other Ambulatory Visit: Payer: Self-pay

## 2020-03-17 ENCOUNTER — Telehealth: Payer: Self-pay | Admitting: Oncology

## 2020-03-17 DIAGNOSIS — Z20822 Contact with and (suspected) exposure to covid-19: Secondary | ICD-10-CM | POA: Diagnosis not present

## 2020-03-17 DIAGNOSIS — Z01812 Encounter for preprocedural laboratory examination: Secondary | ICD-10-CM | POA: Diagnosis not present

## 2020-03-17 LAB — SARS CORONAVIRUS 2 (TAT 6-24 HRS): SARS Coronavirus 2: NEGATIVE

## 2020-03-17 NOTE — Telephone Encounter (Signed)
Scheduled appts per 9/20 los. Pt confirmed appt date and time.

## 2020-03-17 NOTE — Telephone Encounter (Signed)
This RN spoke with pt per her call stating concern she incurred post her visit with Dr [name] on 03/14/2020.  She is having an oophorectomy on 9/23 and will continue on Anastrozole.  " I woke up at 2am and heard look up the side effects of anastrozole - and when I did I see that there is chance of endometrial cancer and then I thought why am I not getting my uterus out too "  This RN validated Robin Castillo's concerns but also reviewed how anastrozole works and endometrial cancer is not an issue.  This RN informed her per medical opinion based on her diagnosis and negative genetic work up - removing her uterus is not indicated - but also understanding her concern and peace of mind.  Post 20 minute conversation - Robin Castillo requested I review her concerns with Dr [name] and call her back.  Per review with MD as well as review of current literature - anastrozole and endometrial cancer is not associated.  This RN returned call to pt and obtained her VM.  VM left by this RN requesting a return call.

## 2020-03-17 NOTE — H&P (Signed)
Robin Castillo is a 49 y.o.  female, P: 2-0-2-2  who presents for bilateral salpingo-oophorectomy because of an estrogen receptor positive breast cancer. In January 2021 the patient was found, on a screening mammogram,  to have a new cluster of calcifications within a irregular mass.  Post biopsy results of this area returned Grade II invasive ductal carcinoma. She went on the have a lumpectomy with the final staging of ,  T1b-c N0, stage Ia invasive ductal carcinoma, grade 2, estrogen and progesterone receptor positive, She underwent radiation therapy and started Zoladex 10/12/2019-02/29/2020 and Anastrazole started 11/27/2019.  She denies any pelvic pain, changes in bowel or bladder function nor vaginitis symptoms.  In February 2021 the patient had an endometrial biopsy due to irregular bleeding  that returned benign proliferative endometrium and  endocervical polyp with no hyperplasia, atypia or malignancy.  She denies any pelvic pain, changes in her bowel or bladder function or vaginitis symptoms.  Since beginning her estrogen suppression therapy, the patient has not had any further vaginal bleeding. The patient now wants to proceed with removal of both of her ovaries and tubes in order to achieve a permanent menopausal state and further decrease her risk of breast cancer recurrence.   Past Medical History  OB History: G: 4;  P: 2-0-2-2; C-section 2006 and SVB 2008  GYN History: menarche: 49 YO;  LMP: 11/26/2019; Contraception: Abstinence;  Denies history of abnormal PAP smear. Last PAP smear: January 2020-normal  Medical History: Hypertension, Polycystic Ovarian Syndrome, Finger Joint Arthritis, Psoriasis and Left Breast Invasive Ductal Carcinoma (Estrogen Receptor Positive)  Surgical History: 2001 Left Breast Excision-Dermatofibroma; 2006 C-Section;   2013 Breast Enhancement Surgery;  2021 Left Lumpectomy and Re-excision of the same Denies problems with anesthesia or history of blood  transfusions  Family History: Cardiovascular Disease, Depression, Hypertension, Prostate Cancer, Psoriasis and Alcoholism.  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   Medications: Anastrozole 1 mg daily Lisinopril/HCTZ 10/12.5 mg twice daily Zoladex 3.6 mg Sub-Q Implant every 28 days Diclofenac 75 mg  po pc daily prn  No Known Allergies  Denies sensitivity to peanuts, shellfish, soy, latex or adhesives.  ROS: Admits to finger joint pain and swelling, skin rash between breast but denies corrective lenses,  headache, vision changes, nasal congestion, dysphagia, tinnitus, dizziness, hoarseness, cough,  chest pain, shortness of breath, nausea, vomiting, diarrhea,constipation,  urinary frequency, urgency  dysuria, hematuria, vaginitis symptoms, pelvic pain, ,easy bruising,  myalgias,  unexplained weight loss and except as is mentioned in the history of present illness, patient's review of systems is otherwise negative.   Physical Exam  Bp: 116/78;  P: 72 bpm; R: 16;  Weight: 142 lbs.;  Height: 5' 4.5";  BMI: 24; O2Sat.: 100 % (room air)  Neck: supple without masses or thyromegaly Lungs: clear to auscultation Heart: regular rate and rhythm Abdomen: soft, non-tender and no organomegaly Pelvic:EGBUS- wnl; vagina-normal rugae; uterus-normal size, cervix without lesions or motion tenderness; adnexae-no tenderness or masses Extremities:  no clubbing, cyanosis or edema   Assesment: Estrogen Receptor Positive Breast Cancer   Disposition:  A robot-assisted salpingo-oophorectomy was reviewed with the patient: [Reviewed with patient that hysterectomy is not the  recommended standard of care for her diagnosis.]  Benefits of the robotic approach include lesser postoperative pain, less blood loss during surgery, reduced risk of injury to other organs due to better visualization with a 3-D HD 10 times magnifying camera, shorter hospital stay   and rapid recovery with return to  daily routine in 1 week .  Although robot-assisted hysterectomy has a longer operative time than traditional laparotomy, in a patient with good medical history, the benefits usually outweigh the risks.   Risks include bleeding, infection, injury to other organs, need for laparotomy, transient post-operative facial edema, as well as immediate and permanent  onset of menopause. PAP smear screening would continue as currently recommended.  The patient verbalized understanding of these risks and has consented to proceed with a Robot Assisted Bilateral Salpingo-oophorectomy at [facility] on March 20, 2020 @ 1:45 pm.  CSN# [id number]   Robin Claytor J. Florene Glen, PA-C  for Dr. Dede Castillo. Robin Castillo

## 2020-03-17 NOTE — Progress Notes (Addendum)
Spoke w/ via phone for pre-op interview---pt Lab needs dos----   Urine preg            Lab results------cbc with dif, cmet 03-14-2020 epic ekg 08-21-19 epic COVID test ------02-2020 at 825 am Arrive at ------1200 pm 03-20-20 NPO after MN NO Solid Food.  Clear liquids from MN until---1100 am drink ensure drink at 110 am then npo Medications to take morning of surgery -----none Diabetic medication -----n/a Patient Special Instructions -----pt to pick up ensure drink 03-17-20 at 1630 pm at [place] special Istructions -----none Patient verbalized understanding of instructions that were given at this phone interview. Patient denies shortness of breath, chest pain, fever, cough at this phone interview.

## 2020-03-18 ENCOUNTER — Telehealth: Payer: Self-pay

## 2020-03-18 NOTE — Telephone Encounter (Signed)
Returned pt's call since Hinda Lenis, RN spoke with pt 9/20. This nurse reiterated what was said by Dr [name] for pt in Valerie's note. Pt verbalized thanks and understanding, but voiced concern about aggressive cancer.

## 2020-03-20 ENCOUNTER — Encounter (HOSPITAL_BASED_OUTPATIENT_CLINIC_OR_DEPARTMENT_OTHER)
Admission: RE | Disposition: A | Discharge status: Home / Self Care | Payer: Self-pay | Source: Home / Self Care | Attending: Obstetrics and Gynecology

## 2020-03-20 ENCOUNTER — Ambulatory Visit (HOSPITAL_BASED_OUTPATIENT_CLINIC_OR_DEPARTMENT_OTHER): Payer: 59 | Admitting: Certified Registered"

## 2020-03-20 ENCOUNTER — Ambulatory Visit (HOSPITAL_BASED_OUTPATIENT_CLINIC_OR_DEPARTMENT_OTHER)
Admission: RE | Admit: 2020-03-20 | Discharge: 2020-03-20 | Disposition: A | Discharge status: Home / Self Care | Payer: 59 | Attending: Obstetrics and Gynecology | Admitting: Obstetrics and Gynecology

## 2020-03-20 ENCOUNTER — Encounter (HOSPITAL_BASED_OUTPATIENT_CLINIC_OR_DEPARTMENT_OTHER): Payer: Self-pay | Admitting: Obstetrics and Gynecology

## 2020-03-20 DIAGNOSIS — Z79899 Other long term (current) drug therapy: Secondary | ICD-10-CM | POA: Insufficient documentation

## 2020-03-20 DIAGNOSIS — N83202 Unspecified ovarian cyst, left side: Secondary | ICD-10-CM | POA: Insufficient documentation

## 2020-03-20 DIAGNOSIS — C50912 Malignant neoplasm of unspecified site of left female breast: Secondary | ICD-10-CM | POA: Insufficient documentation

## 2020-03-20 DIAGNOSIS — M1990 Unspecified osteoarthritis, unspecified site: Secondary | ICD-10-CM | POA: Diagnosis not present

## 2020-03-20 DIAGNOSIS — Z4002 Encounter for prophylactic removal of ovary: Secondary | ICD-10-CM | POA: Diagnosis not present

## 2020-03-20 DIAGNOSIS — I10 Essential (primary) hypertension: Secondary | ICD-10-CM | POA: Diagnosis not present

## 2020-03-20 DIAGNOSIS — N83201 Unspecified ovarian cyst, right side: Secondary | ICD-10-CM | POA: Diagnosis not present

## 2020-03-20 DIAGNOSIS — Z170 Estrogen receptor positive status [ER+]: Secondary | ICD-10-CM | POA: Diagnosis not present

## 2020-03-20 DIAGNOSIS — Z853 Personal history of malignant neoplasm of breast: Secondary | ICD-10-CM

## 2020-03-20 HISTORY — DX: Migraine, unspecified, not intractable, without status migrainosus: G43.909

## 2020-03-20 HISTORY — DX: Psoriasis, unspecified: L40.9

## 2020-03-20 LAB — POCT PREGNANCY, URINE: Preg Test, Ur: NEGATIVE

## 2020-03-20 SURGERY — SALPINGO-OOPHORECTOMY, BILATERAL, ROBOT-ASSISTED
Anesthesia: General | Laterality: Bilateral

## 2020-03-20 MED ORDER — SCOPOLAMINE 1 MG/3DAYS TD PT72
MEDICATED_PATCH | TRANSDERMAL | Status: AC
  Filled 2020-03-20: qty 1

## 2020-03-20 MED ORDER — SILVER NITRATE-POT NITRATE 75-25 % EX MISC
CUTANEOUS | Status: AC
  Filled 2020-03-20: qty 10

## 2020-03-20 MED ORDER — ACETAMINOPHEN 500 MG PO TABS
ORAL_TABLET | ORAL | Status: AC
  Filled 2020-03-20: qty 2

## 2020-03-20 MED ORDER — ENSURE PRE-SURGERY PO LIQD: 296.0000 mL | Freq: Once | ORAL | Status: DC

## 2020-03-20 MED ORDER — ROPIVACAINE HCL 0.5 % EP SOLN
EPIDURAL | Status: DC | PRN
  Administered 2020-03-20: 50 mL via SURGICAL_CAVITY

## 2020-03-20 MED ORDER — CEFAZOLIN SODIUM-DEXTROSE 2-4 GM/100ML-% IV SOLN
INTRAVENOUS | Status: AC
  Filled 2020-03-20: qty 100

## 2020-03-20 MED ORDER — FENTANYL CITRATE (PF) 250 MCG/5ML IJ SOLN
INTRAMUSCULAR | Status: AC
  Filled 2020-03-20: qty 5

## 2020-03-20 MED ORDER — MIDAZOLAM HCL 5 MG/5ML IJ SOLN
INTRAMUSCULAR | Status: DC | PRN
  Administered 2020-03-20: 2 mg via INTRAVENOUS

## 2020-03-20 MED ORDER — SUGAMMADEX SODIUM 200 MG/2ML IV SOLN
INTRAVENOUS | Status: DC | PRN
  Administered 2020-03-20: 200 mg via INTRAVENOUS

## 2020-03-20 MED ORDER — CEFAZOLIN SODIUM-DEXTROSE 2-4 GM/100ML-% IV SOLN
2.0000 g | INTRAVENOUS | Status: AC
  Administered 2020-03-20: 2 g via INTRAVENOUS

## 2020-03-20 MED ORDER — DEXMEDETOMIDINE (PRECEDEX) IN NS 20 MCG/5ML (4 MCG/ML) IV SYRINGE
PREFILLED_SYRINGE | INTRAVENOUS | Status: AC
  Filled 2020-03-20: qty 5

## 2020-03-20 MED ORDER — CELECOXIB 200 MG PO CAPS
400.0000 mg | ORAL_CAPSULE | ORAL | Status: AC
  Administered 2020-03-20: 400 mg via ORAL

## 2020-03-20 MED ORDER — PROPOFOL 10 MG/ML IV BOLUS
INTRAVENOUS | Status: AC
  Filled 2020-03-20: qty 20

## 2020-03-20 MED ORDER — LACTATED RINGERS IV SOLN: INTRAVENOUS | Status: DC

## 2020-03-20 MED ORDER — ACETAMINOPHEN 160 MG/5ML PO SOLN: 325.0000 mg | ORAL | Status: DC | PRN

## 2020-03-20 MED ORDER — SODIUM CHLORIDE (PF) 0.9 % IJ SOLN
INTRAMUSCULAR | Status: DC | PRN
  Administered 2020-03-20: 50 mL

## 2020-03-20 MED ORDER — OXYCODONE HCL 5 MG PO TABS: 5.0000 mg | ORAL_TABLET | Freq: Once | ORAL | Status: DC | PRN

## 2020-03-20 MED ORDER — PHENYLEPHRINE 40 MCG/ML (10ML) SYRINGE FOR IV PUSH (FOR BLOOD PRESSURE SUPPORT)
PREFILLED_SYRINGE | INTRAVENOUS | Status: DC | PRN
  Administered 2020-03-20 (×3): 80 ug via INTRAVENOUS

## 2020-03-20 MED ORDER — ACETAMINOPHEN 500 MG PO TABS: ORAL_TABLET | ORAL | 1 refills | Status: DC

## 2020-03-20 MED ORDER — ACETAMINOPHEN 500 MG PO TABS
1000.0000 mg | ORAL_TABLET | Freq: Once | ORAL | Status: AC
  Administered 2020-03-20: 1000 mg via ORAL

## 2020-03-20 MED ORDER — KETOROLAC TROMETHAMINE 30 MG/ML IJ SOLN: 30.0000 mg | Freq: Once | INTRAMUSCULAR | Status: DC | PRN

## 2020-03-20 MED ORDER — LIDOCAINE 2% (20 MG/ML) 5 ML SYRINGE
INTRAMUSCULAR | Status: AC
  Filled 2020-03-20: qty 5

## 2020-03-20 MED ORDER — OXYCODONE HCL 5 MG PO TABS: ORAL_TABLET | ORAL | 0 refills | Status: DC

## 2020-03-20 MED ORDER — PROPOFOL 10 MG/ML IV BOLUS
INTRAVENOUS | Status: DC | PRN
  Administered 2020-03-20: 200 mg via INTRAVENOUS

## 2020-03-20 MED ORDER — LIDOCAINE HCL 2 % IJ SOLN
INTRAMUSCULAR | Status: AC
  Filled 2020-03-20: qty 20

## 2020-03-20 MED ORDER — OXYCODONE HCL 5 MG/5ML PO SOLN: 5.0000 mg | Freq: Once | ORAL | Status: DC | PRN

## 2020-03-20 MED ORDER — SODIUM CHLORIDE 0.9 % IR SOLN
Status: DC | PRN
  Administered 2020-03-20: 3000 mL

## 2020-03-20 MED ORDER — ONDANSETRON HCL 4 MG/2ML IJ SOLN
INTRAMUSCULAR | Status: DC | PRN
  Administered 2020-03-20: 4 mg via INTRAVENOUS

## 2020-03-20 MED ORDER — MIDAZOLAM HCL 2 MG/2ML IJ SOLN
INTRAMUSCULAR | Status: AC
  Filled 2020-03-20: qty 2

## 2020-03-20 MED ORDER — DEXAMETHASONE SODIUM PHOSPHATE 10 MG/ML IJ SOLN
INTRAMUSCULAR | Status: DC | PRN
  Administered 2020-03-20: 10 mg via INTRAVENOUS

## 2020-03-20 MED ORDER — ONDANSETRON HCL 4 MG/2ML IJ SOLN
INTRAMUSCULAR | Status: AC
  Filled 2020-03-20: qty 2

## 2020-03-20 MED ORDER — LIDOCAINE HCL (PF) 2 % IJ SOLN: INTRAMUSCULAR | Status: DC | PRN

## 2020-03-20 MED ORDER — ROCURONIUM BROMIDE 10 MG/ML (PF) SYRINGE
PREFILLED_SYRINGE | INTRAVENOUS | Status: AC
  Filled 2020-03-20: qty 10

## 2020-03-20 MED ORDER — ENSURE PRE-SURGERY PO LIQD: 592.0000 mL | Freq: Once | ORAL | Status: DC

## 2020-03-20 MED ORDER — GABAPENTIN 300 MG PO CAPS
ORAL_CAPSULE | ORAL | Status: AC
  Filled 2020-03-20: qty 1

## 2020-03-20 MED ORDER — PHENYLEPHRINE 40 MCG/ML (10ML) SYRINGE FOR IV PUSH (FOR BLOOD PRESSURE SUPPORT)
PREFILLED_SYRINGE | INTRAVENOUS | Status: AC
  Filled 2020-03-20: qty 20

## 2020-03-20 MED ORDER — ACETAMINOPHEN 325 MG PO TABS: 325.0000 mg | ORAL_TABLET | ORAL | Status: DC | PRN

## 2020-03-20 MED ORDER — CELECOXIB 200 MG PO CAPS
ORAL_CAPSULE | ORAL | Status: AC
  Filled 2020-03-20: qty 2

## 2020-03-20 MED ORDER — LIDOCAINE 2% (20 MG/ML) 5 ML SYRINGE
INTRAMUSCULAR | Status: DC | PRN
  Administered 2020-03-20: 100 mg via INTRAVENOUS

## 2020-03-20 MED ORDER — STERILE WATER FOR IRRIGATION IR SOLN
Status: DC | PRN
  Administered 2020-03-20: 500 mL

## 2020-03-20 MED ORDER — DEXAMETHASONE SODIUM PHOSPHATE 10 MG/ML IJ SOLN
INTRAMUSCULAR | Status: AC
  Filled 2020-03-20: qty 1

## 2020-03-20 MED ORDER — GABAPENTIN 300 MG PO CAPS
300.0000 mg | ORAL_CAPSULE | ORAL | Status: AC
  Administered 2020-03-20: 300 mg via ORAL

## 2020-03-20 MED ORDER — SCOPOLAMINE 1 MG/3DAYS TD PT72
1.0000 | MEDICATED_PATCH | TRANSDERMAL | Status: DC
  Administered 2020-03-20: 1.5 mg via TRANSDERMAL

## 2020-03-20 MED ORDER — MEPERIDINE HCL 25 MG/ML IJ SOLN: 6.2500 mg | INTRAMUSCULAR | Status: DC | PRN

## 2020-03-20 MED ORDER — POVIDONE-IODINE 10 % EX SWAB: 2.0000 "application " | Freq: Once | CUTANEOUS | Status: DC

## 2020-03-20 MED ORDER — LIDOCAINE HCL (PF) 2 % IJ SOLN
INTRAMUSCULAR | Status: DC | PRN
  Administered 2020-03-20: 1.5 mg/kg/h

## 2020-03-20 MED ORDER — ONDANSETRON HCL 4 MG/2ML IJ SOLN: 4.0000 mg | Freq: Once | INTRAMUSCULAR | Status: DC | PRN

## 2020-03-20 MED ORDER — ROCURONIUM BROMIDE 10 MG/ML (PF) SYRINGE
PREFILLED_SYRINGE | INTRAVENOUS | Status: DC | PRN
  Administered 2020-03-20: 60 mg via INTRAVENOUS

## 2020-03-20 MED ORDER — DEXMEDETOMIDINE (PRECEDEX) IN NS 20 MCG/5ML (4 MCG/ML) IV SYRINGE
PREFILLED_SYRINGE | INTRAVENOUS | Status: DC | PRN
  Administered 2020-03-20: 8 ug via INTRAVENOUS

## 2020-03-20 MED ORDER — FENTANYL CITRATE (PF) 100 MCG/2ML IJ SOLN
INTRAMUSCULAR | Status: DC | PRN
  Administered 2020-03-20: 150 ug via INTRAVENOUS

## 2020-03-20 MED ORDER — FENTANYL CITRATE (PF) 100 MCG/2ML IJ SOLN: 25.0000 ug | INTRAMUSCULAR | Status: DC | PRN

## 2020-03-20 MED ORDER — IBUPROFEN 600 MG PO TABS: ORAL_TABLET | ORAL | 1 refills | Status: DC

## 2020-03-20 SURGICAL SUPPLY — 72 items
ADH SKN CLS APL DERMABOND .7 (GAUZE/BANDAGES/DRESSINGS) ×1
BARRIER ADHS 3X4 INTERCEED (GAUZE/BANDAGES/DRESSINGS) ×2 IMPLANT
BLADE LAP MORCELLATOR 15X9.5 (ELECTROSURGICAL) IMPLANT
BLADE MORCELLATOR EXT  12.5X15 (ELECTROSURGICAL)
BLADE MORCELLATOR EXT 12.5X15 (ELECTROSURGICAL) IMPLANT
BRR ADH 4X3 ABS CNTRL BYND (GAUZE/BANDAGES/DRESSINGS) ×1
CANISTER SUCT 3000ML PPV (MISCELLANEOUS) ×2 IMPLANT
CATH FOLEY 3WAY  5CC 16FR (CATHETERS) ×2
CATH FOLEY 3WAY 5CC 16FR (CATHETERS) ×1 IMPLANT
COVER BACK TABLE 60X90IN (DRAPES) ×2 IMPLANT
COVER TIP SHEARS 8 DVNC (MISCELLANEOUS) ×1 IMPLANT
COVER TIP SHEARS 8MM DA VINCI (MISCELLANEOUS) ×2
COVER WAND RF STERILE (DRAPES) ×2 IMPLANT
DECANTER SPIKE VIAL GLASS SM (MISCELLANEOUS) ×4 IMPLANT
DEFOGGER SCOPE WARMER CLEARIFY (MISCELLANEOUS) ×2 IMPLANT
DERMABOND ADVANCED (GAUZE/BANDAGES/DRESSINGS) ×1
DERMABOND ADVANCED .7 DNX12 (GAUZE/BANDAGES/DRESSINGS) ×1 IMPLANT
DILATOR CANAL MILEX (MISCELLANEOUS) IMPLANT
DRAPE ARM DVNC X/XI (DISPOSABLE) ×4 IMPLANT
DRAPE COLUMN DVNC XI (DISPOSABLE) ×1 IMPLANT
DRAPE DA VINCI XI ARM (DISPOSABLE) ×8
DRAPE DA VINCI XI COLUMN (DISPOSABLE) ×2
DRAPE UTILITY XL STRL (DRAPES) ×2 IMPLANT
DURAPREP 26ML APPLICATOR (WOUND CARE) ×2 IMPLANT
ELECT REM PT RETURN 9FT ADLT (ELECTROSURGICAL) ×2
ELECTRODE REM PT RTRN 9FT ADLT (ELECTROSURGICAL) ×1 IMPLANT
GAUZE 4X4 16PLY RFD (DISPOSABLE) ×2 IMPLANT
GLOVE BIO SURGEON STRL SZ 6.5 (GLOVE) ×2 IMPLANT
GLOVE BIO SURGEON STRL SZ7 (GLOVE) ×2 IMPLANT
GLOVE BIOGEL PI IND STRL 7.0 (GLOVE) ×6 IMPLANT
GLOVE BIOGEL PI INDICATOR 7.0 (GLOVE) ×6
GLOVE ECLIPSE 6.5 STRL STRAW (GLOVE) ×6 IMPLANT
HOLDER FOLEY CATH W/STRAP (MISCELLANEOUS) IMPLANT
IRRIG SUCT STRYKERFLOW 2 WTIP (MISCELLANEOUS) ×2
IRRIGATION SUCT STRKRFLW 2 WTP (MISCELLANEOUS) ×1 IMPLANT
LEGGING LITHOTOMY PAIR STRL (DRAPES) ×2 IMPLANT
NEEDLE HYPO 22GX1.5 SAFETY (NEEDLE) ×2 IMPLANT
OBTURATOR OPTICAL STANDARD 8MM (TROCAR) ×2
OBTURATOR OPTICAL STND 8 DVNC (TROCAR) ×1
OBTURATOR OPTICALSTD 8 DVNC (TROCAR) ×1 IMPLANT
OCCLUDER COLPOPNEUMO (BALLOONS) ×2 IMPLANT
PACK ROBOT WH (CUSTOM PROCEDURE TRAY) ×2 IMPLANT
PACK ROBOTIC GOWN (GOWN DISPOSABLE) ×2 IMPLANT
PACK TRENDGUARD 450 HYBRID PRO (MISCELLANEOUS) ×1 IMPLANT
PAD OB MATERNITY 4.3X12.25 (PERSONAL CARE ITEMS) ×4 IMPLANT
PAD PREP 24X48 CUFFED NSTRL (MISCELLANEOUS) ×2 IMPLANT
POUCH LAPAROSCOPIC INSTRUMENT (MISCELLANEOUS) ×2 IMPLANT
PROTECTOR NERVE ULNAR (MISCELLANEOUS) ×6 IMPLANT
SEAL CANN UNIV 5-8 DVNC XI (MISCELLANEOUS) ×4 IMPLANT
SEAL XI 5MM-8MM UNIVERSAL (MISCELLANEOUS) ×8
SEALER VESSEL DA VINCI XI (MISCELLANEOUS) ×2
SEALER VESSEL EXT DVNC XI (MISCELLANEOUS) ×1 IMPLANT
SET IRRIG Y TYPE TUR BLADDER L (SET/KITS/TRAYS/PACK) IMPLANT
SET TRI-LUMEN FLTR TB AIRSEAL (TUBING) ×2 IMPLANT
SOL PREP PROV IODINE SCRUB 4OZ (MISCELLANEOUS) ×2 IMPLANT
STRIP CLOSURE SKIN 1/4X4 (GAUZE/BANDAGES/DRESSINGS) ×2 IMPLANT
SUT MNCRL AB 3-0 PS2 27 (SUTURE) ×4 IMPLANT
SUT VIC AB 0 CT1 27 (SUTURE) ×4
SUT VIC AB 0 CT1 27XBRD ANBCTR (SUTURE) ×2 IMPLANT
SUT VIC AB 2-0 UR6 27 (SUTURE) ×4 IMPLANT
SUT VICRYL 0 UR6 27IN ABS (SUTURE) ×2 IMPLANT
SUT VLOC 180 0 9IN  GS21 (SUTURE) ×4
SUT VLOC 180 0 9IN GS21 (SUTURE) ×2 IMPLANT
TIP RUMI ORANGE 6.7MMX12CM (TIP) IMPLANT
TIP UTERINE 5.1X6CM LAV DISP (MISCELLANEOUS) IMPLANT
TIP UTERINE 6.7X10CM GRN DISP (MISCELLANEOUS) IMPLANT
TIP UTERINE 6.7X6CM WHT DISP (MISCELLANEOUS) IMPLANT
TIP UTERINE 6.7X8CM BLUE DISP (MISCELLANEOUS) ×2 IMPLANT
TOWEL OR 17X26 10 PK STRL BLUE (TOWEL DISPOSABLE) ×2 IMPLANT
TRENDGUARD 450 HYBRID PRO PACK (MISCELLANEOUS) ×2
TROCAR PORT AIRSEAL 8X120 (TROCAR) ×2 IMPLANT
WATER STERILE IRR 1000ML POUR (IV SOLUTION) ×2 IMPLANT

## 2020-03-20 NOTE — Interval H&P Note (Signed)
History and Physical Interval Note:  03/20/2020 12:50 PM  Robin Castillo  has presented today for surgery, with the diagnosis of left breat cancer.  The various methods of treatment have been discussed with the patient and family. After consideration of risks, benefits and other options for treatment, the patient has consented to  Procedure(s) with comments: XI ROBOTIC ASSISTED BILATERAL SALPINGO OOPHORECTOMY (Bilateral) - Dr. [name] requesting 2 / 2 1/2 hours as a surgical intervention.  The patient's history has been reviewed, patient examined, no change in status, stable for surgery.  I have reviewed the patient's chart and labs.  Questions were answered to the patient's satisfaction.     Katharine Look A Athina Fahey

## 2020-03-20 NOTE — Anesthesia Procedure Notes (Signed)
Procedure Name: Intubation Date/Time: 03/20/2020 1:11 PM Performed by: Rogers Blocker, CRNA Pre-anesthesia Checklist: Patient identified, Emergency Drugs available, Suction available and Patient being monitored Patient Re-evaluated:Patient Re-evaluated prior to induction Oxygen Delivery Method: Circle System Utilized Preoxygenation: Pre-oxygenation with 100% oxygen Induction Type: IV induction Ventilation: Mask ventilation without difficulty Laryngoscope Size: Mac and 3 Grade View: Grade I Tube type: Oral Tube size: 7.0 mm Number of attempts: 1 Airway Equipment and Method: Stylet and Oral airway Placement Confirmation: ETT inserted through vocal cords under direct vision,  positive ETCO2 and breath sounds checked- equal and bilateral Tube secured with: Tape Dental Injury: Teeth and Oropharynx as per pre-operative assessment

## 2020-03-20 NOTE — Transfer of Care (Signed)
Immediate Anesthesia Transfer of Care Note  Patient: Robin Castillo  Procedure(s) Performed: XI ROBOTIC ASSISTED BILATERAL SALPINGO OOPHORECTOMY (Bilateral )  Patient Location: PACU  Anesthesia Type:General  Level of Consciousness: oriented, drowsy and patient cooperative  Airway & Oxygen Therapy: Patient Spontanous Breathing and Patient connected to nasal cannula oxygen  Post-op Assessment: Report given to RN and Post -op Vital signs reviewed and stable  Post vital signs: Reviewed and stable  Last Vitals:  Vitals Value Taken Time  BP 140/86 03/20/20 1505  Temp    Pulse 67 03/20/20 1508  Resp 11 03/20/20 1508  SpO2 100 % 03/20/20 1508  Vitals shown include unvalidated device data.  Last Pain:  Vitals:   03/20/20 1222  TempSrc: Oral  PainSc: 0-No pain      Patients Stated Pain Goal: 4 (40/10/27 2536)  Complications: No complications documented.

## 2020-03-20 NOTE — Op Note (Signed)
Preoperative diagnosis: Breast cancer  Postoperative diagnosis: same with pelvic adhesions  Anesthesia: General   Anesthesiologist: Dr. Jillyn Hidden  Procedure: Robotically assisted bilateral salpyngo-oophorectomy  Surgeon: Dr. Katharine Look Leora Platt   Assistant: Earnstine Regal P.A.-C .  Estimated blood loss: minimal  Procedure:   After being informed of the planned procedure with possible complications including but not limited to bleeding, infection, injury to other organs, need for laparotomy, expected hospital stay and recovery, informed consent is obtained and patient is taken to or #5. She is placed in lithotomy position on Treng[place] with both arms padded and tucked on each side and bilateral knee-high sequential compressive devices. She is given general anesthesia with endotracheal intubation without any complication. She is prepped and draped in a sterile fashion. A Foley catheter is inserted in her bladder.  Pelvic exam reveals: anteverted uterus with normal adnexa  A weighted speculum is inserted in the vagina and the anterior lip of the cervix is grasped with a tenaculum forcep. We proceed with a paracervical block and vaginal infiltration using ropivacaine 0.5% diluted 1 in 1 with saline. The uterus was then sounded at 8 cm. We easily dilate the cervix using Hegar dilator to #27 which allows for easy placement of the intrauterine RUMI manipulator.  Trocar placement is decided. We infiltrate at the umbilicus with 10 cc of ropivacaine per protocol and perform a 10 mm semi-elleptical incision which is brought down bluntly to the fascia. The fascia is identified and grasped with Coker forceps. The fascia is incised with Mayo scissors. Peritoneum is entered bluntly. A pursestring suture of 0 Vicryl is placed on the fascia and a 10 mm Hassan trocar is easily inserted in the abdominal cavity held in placed with a Purstring suture. This allows for easy insufflation of a pneumoperitoneum using warmed  CO2 at a maximum pressure of 15 mm of mercury. 60 cc of Ropivacaine 0.5 % diluted 1 in 1 is sent in the pelvis and the patient is positioned in reverse Trendelenburg. We then placed one 79mm robotic trocar on the left and one 50mm robotic trocar on the right  after infiltrating every site with ropivacaine per protocol. The robot is docked on the right of the patient after positioning her in Trendelenburg. Vessel Seal is inserted in arm #4 and a Long Bipolar Forceps is inserted in arm #2.  Preparation and docking is completed in 40 minutes.   Observation: Anterior and posterior cul-de-sac, uterus, tubes and ovaries are normal. There are fine grade 1 and 2 adhesions between the left ovary and the pelvic wall. Liver and gallbladder are normal. Appendix is not seen. Fine adhesions are seen between the colon and the right abdominal wall.  After identifying the left ureter, we Vessel Seal and cut the left infundibulopelvic ligament. We Vessel Seal and cut the left tube at its uterine insertion, the left utero-ovarian ligament and the mesosalpynx. Using monopolar scissors, we sharply dissect the ovarian adhesions. The left adnexa is placed in the posterior cul-de-sac.  We proceed the same way on the right side.   .Hemostasis is adequate.  Console time is 17 minutes. Instruments are removed and the robot is undocked.  The specimen is removed from the pelvis through the umbilical incision.   All trochars are removed under direct visualization after evacuating the pneumoperitoneum.   The fascia of the supraumbilical incision is closed with the previously placed pursestring suture of 0 Vicryl. All incisions are then closed with subcuticular suture of 3-0 Monocryl and Dermabond.   The  RUMI manipulator is removed. A speculum is inserted in the vagina. Hemostasis on the cervix is obtained with Silver Nitrate. Hemostasis is adequate.  Instrument and sponge count is complete x2. Estimated blood loss is  minimal.   The procedure is well tolerated by the patient who is taken to recovery room in a well and stable condition.   Specimen: Both ovaries and both tubes sent to pathology.

## 2020-03-20 NOTE — Discharge Instructions (Signed)
Call [facility] @ [phone] if:  You have a temperature greater than or equal to 100.4 degrees Farenheit orally You have pain that is not made better by the pain medication given and taken as directed You have excessive bleeding or problems urinating  Take Colace (Docusate Sodium/Stool Softener) 100 mg 2-3 times daily while taking narcotic pain medicine to avoid constipation or until bowel movements are regular. Starting at 6:30 p.m., day of surgery begin taking Ibuprofen 600 mg with food along with Tylenol  (acetaminophen) 500 mg  #2 tablets every 6 hours for 5 days then as needed for pain  You may drive after 24 hours You may walk up steps  You may shower tomorrow You may resume a regular diet  Keep incisions clean and dry Do not lift over 15 pounds for 6 weeks Avoid anything in vagina  until after your post-operative visit  DISCHARGE INSTRUCTIONS: Laparoscopy  The following instructions have been prepared to help you care for yourself upon your return home today.  Wound care: Marland Kitchen Do not get the incision wet for the first 24 hours. The incision should be kept clean and dry. . The Band-Aids or dressings may be removed the day after surgery. . Should the incision become sore, red, and swollen after the first week, check with your doctor.  Personal hygiene: . Shower the day after your procedure.  Activity and limitations: . Do NOT drive or operate any equipment today. . Do NOT lift anything more than 15 pounds for 2-3 weeks after surgery. . Do NOT rest in bed all day. . Walking is encouraged. Walk each day, starting slowly with 5-minute walks 3 or 4 times a day. Slowly increase the length of your walks. . Walk up and down stairs slowly. . Do NOT do strenuous activities, such as golfing, playing tennis, bowling, running, biking, weight lifting, gardening, mowing, or vacuuming for 2-4 weeks. Ask your doctor when it is okay to start.  Diet: Eat a light meal as desired  this evening. You may resume your usual diet tomorrow.  Return to work: This is dependent on the type of work you do. For the most part you can return to a desk job within a week of surgery. If you are more active at work, please discuss this with your doctor.  What to expect after your surgery: You may have a slight burning sensation when you urinate on the first day. You may have a very small amount of blood in the urine. Expect to have a small amount of vaginal discharge/light bleeding for 1-2 weeks. It is not unusual to have abdominal soreness and bruising for up to 2 weeks. You may be tired and need more rest for about 1 week. You may experience shoulder pain for 24-72 hours. Lying flat in bed may relieve it.  Call your doctor for any of the following: . Develop a fever of 100.4 or greater . Inability to urinate 6 hours after discharge from hospital . Severe pain not relieved by pain medications . Persistent of heavy bleeding at incision site . Redness or swelling around incision site after a week . Increasing nausea or vomiting  Post Anesthesia Home Care Instructions  Activity: Get plenty of rest for the remainder of the day. A responsible adult should stay with you for 24 hours following the procedure.  For the next 24 hours, DO NOT: -Drive a car -Paediatric nurse -Drink alcoholic beverages -Take any medication unless instructed by your physician -Make any  legal decisions or sign important papers.  Meals: Start with liquid foods such as gelatin or soup. Progress to regular foods as tolerated. Avoid greasy, spicy, heavy foods. If nausea and/or vomiting occur, drink only clear liquids until the nausea and/or vomiting subsides. Call your physician if vomiting continues.  Special Instructions/Symptoms: Your throat may feel dry or sore from the anesthesia or the breathing tube placed in your throat during surgery. If this causes discomfort, gargle with warm salt water. The discomfort  should disappear within 24 hours.  If you had a scopolamine patch placed behind your ear for the management of post- operative nausea and/or vomiting:  1. The medication in the patch is effective for 72 hours, after which it should be removed.  Wrap patch in a tissue and discard in the trash. Wash hands thoroughly with soap and water. 2. You may remove the patch earlier than 72 hours if you experience unpleasant side effects which may include dry mouth, dizziness or visual disturbances. 3. Avoid touching the patch. Wash your hands with soap and water after contact with the patch.

## 2020-03-20 NOTE — Anesthesia Preprocedure Evaluation (Signed)
Anesthesia Evaluation  Patient identified by MRN, date of birth, ID band Patient awake    Reviewed: Allergy & Precautions, NPO status , Patient's Chart, lab work & pertinent test results  Airway Mallampati: I  TM Distance: >3 FB Neck ROM: Full    Dental  (+) Teeth Intact, Dental Advisory Given   Pulmonary neg pulmonary ROS,    breath sounds clear to auscultation       Cardiovascular hypertension, Pt. on medications Normal cardiovascular exam Rhythm:Regular Rate:Normal     Neuro/Psych  Headaches, negative psych ROS   GI/Hepatic negative GI ROS, Neg liver ROS,   Endo/Other  negative endocrine ROS  Renal/GU negative Renal ROS  negative genitourinary   Musculoskeletal   Abdominal Normal abdominal exam  (+)   Peds  Hematology negative hematology ROS (+)   Anesthesia Other Findings   Reproductive/Obstetrics                             Anesthesia Physical  Anesthesia Plan  ASA: II  Anesthesia Plan: General   Post-op Pain Management:    Induction: Intravenous  PONV Risk Score and Plan: Ondansetron, Dexamethasone and Midazolam  Airway Management Planned: Oral ETT  Additional Equipment: None  Intra-op Plan:   Post-operative Plan:   Informed Consent: I have reviewed the patients History and Physical, chart, labs and discussed the procedure including the risks, benefits and alternatives for the proposed anesthesia with the patient or authorized representative who has indicated his/her understanding and acceptance.     Dental advisory given  Plan Discussed with: CRNA  Anesthesia Plan Comments:         Anesthesia Quick Evaluation

## 2020-03-21 ENCOUNTER — Encounter (HOSPITAL_BASED_OUTPATIENT_CLINIC_OR_DEPARTMENT_OTHER): Payer: Self-pay | Admitting: Obstetrics and Gynecology

## 2020-03-21 NOTE — Anesthesia Postprocedure Evaluation (Signed)
Anesthesia Post Note  Patient: Robin Castillo  Procedure(s) Performed: XI ROBOTIC ASSISTED BILATERAL SALPINGO OOPHORECTOMY (Bilateral )     Patient location during evaluation: PACU Anesthesia Type: General Level of consciousness: awake Pain management: pain level controlled Vital Signs Assessment: post-procedure vital signs reviewed and stable Respiratory status: spontaneous breathing Cardiovascular status: stable Postop Assessment: no apparent nausea or vomiting Anesthetic complications: no   No complications documented.  Last Vitals:  Vitals:   03/20/20 1615 03/20/20 1730  BP: (!) 151/98 (!) 156/94  Pulse: 65 71  Resp: 12 14  Temp: (!) 36.4 C 36.6 C  SpO2: 99% 100%    Last Pain:  Vitals:   03/20/20 1730  TempSrc:   PainSc: 0-No pain   Pain Goal: Patients Stated Pain Goal: 4 (03/20/20 1222)                 Huston Foley

## 2020-03-24 LAB — SURGICAL PATHOLOGY

## 2020-03-24 MED FILL — Ropivacaine HCl Inj 5 MG/ML: INTRAMUSCULAR | Qty: 20 | Status: AC

## 2020-03-24 MED FILL — Ropivacaine HCl Inj 5 MG/ML: INTRAMUSCULAR | Qty: 30 | Status: AC

## 2020-03-28 ENCOUNTER — Ambulatory Visit: Payer: BC Managed Care – PPO

## 2020-03-28 ENCOUNTER — Other Ambulatory Visit: Payer: BC Managed Care – PPO

## 2020-04-15 ENCOUNTER — Encounter (HOSPITAL_COMMUNITY): Payer: Self-pay

## 2020-04-25 ENCOUNTER — Other Ambulatory Visit: Payer: BC Managed Care – PPO

## 2020-04-25 ENCOUNTER — Ambulatory Visit: Payer: BC Managed Care – PPO

## 2020-05-15 ENCOUNTER — Other Ambulatory Visit: Payer: Self-pay | Admitting: Internal Medicine

## 2020-05-15 DIAGNOSIS — E2839 Other primary ovarian failure: Secondary | ICD-10-CM

## 2020-05-23 ENCOUNTER — Other Ambulatory Visit: Payer: BC Managed Care – PPO

## 2020-05-23 ENCOUNTER — Ambulatory Visit: Payer: BC Managed Care – PPO

## 2020-06-05 ENCOUNTER — Encounter: Payer: Self-pay | Admitting: Neurology

## 2020-06-05 ENCOUNTER — Other Ambulatory Visit: Payer: Self-pay

## 2020-06-05 DIAGNOSIS — R202 Paresthesia of skin: Secondary | ICD-10-CM

## 2020-06-08 NOTE — Progress Notes (Signed)
Robin Castillo  [phone] [phone]     ID: Robin Castillo DOB: Jun 28, 1971  MR#: [id number]  ERD#:[id number]  Patient Care Team: [name], MD as PCP - General (Internal Medicine) Rockwell Germany, RN as Oncology Nurse Navigator Mauro Kaufmann, RN as Oncology Nurse Navigator Kyung Rudd, MD as Consulting Physician (Radiation Oncology) [name], MD as Consulting Physician (General Surgery) Zeeva Courser, Virgie Dad, MD as Consulting Physician (Oncology) Earnstine Regal, PA-C as Consulting Physician (Obstetrics and Gynecology) Belva Crome, MD as Consulting Physician (Cardiology) [name], MD as Consulting Physician (Obstetrics and Gynecology) Charlotte Crumb, MD as Consulting Physician (Orthopedic Surgery) Chauncey Cruel, MD OTHER MD:  CHIEF COMPLAINT: Invasive ductal breast cancer, estrogen receptor positive  CURRENT TREATMENT: anastrozole   INTERVAL HISTORY: Robin Castillo returns today for follow up of her estrogen receptor positive breast cancer.   She continues on anastrozole with good tolerance.  She is having some hot flashes which do wake her up at night.  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  Since her last visit, she underwent bilateral salpingo-oophorectomy under Dr. [name] on 03/20/2020. Pathology from the procedure ([id number]) was benign.  Delois tells me since having the surgery her hair has grown back more normally than before and she generally feels better.  She had a bone density ordered by Dr. Mellody Drown on 05/19/2020.  This found a T score of -0.1.   REVIEW OF SYSTEMS: Robin Castillo developed some numbness in her fingertips and has significant stiffness in her hands bilaterally particularly at night and early morning.  She saw Dr. Burney Gauze for this and he is evaluating her for carpal tunnel.  He has suggested a splint which she is wearing regularly and he is planning on some tests due later this  month.  Robin Castillo tells me her new blood pressure medication has also been described as causing some arthritis symptoms.   COVID 19 VACCINATION STATUS: Status post Pfizer x2 most recently May 2021   HISTORY OF CURRENT ILLNESS: From the original intake note:  Robin Castillo presented with a palpable left breast lump. She underwent bilateral diagnostic mammography with tomography and left breast ultrasonography at [facility] on 07/25/2019 showing: breast density category B; mass with associated clustered calcifications (overall dimension up to 1.2 cm on mammogram and 0.9 cm on ultrasound) within upper-outer left breast at 1 o'clock; left axillary lymph node appears within normal limits.  Accordingly on 07/26/2019 she proceeded to biopsy of the left breast area in question. The pathology from this procedure ([id number]) showed: invasive ductal carcinoma, grade 2. Prognostic indicators significant for: estrogen receptor, 70% positive with moderate staining intensity and progesterone receptor, 95% positive with strong staining intensity. Proliferation marker Ki67 at 2%. HER2 negative by immunohistochemistry (1+).  The patient's subsequent history is as detailed below.   PAST MEDICAL HISTORY: Past Medical History:  Diagnosis Date  . Arthritis    per patient new onset in finger joints  . BV (bacterial vaginosis) 2012  . Cancer ([place]) 2021   left breast   . Cyst of ovary, right 2003  . Family history of prostate cancer in father   . H/O rubella   . H/O varicella as child  . History of PCOS 06/2005  . Hypertension   . Infertility, female   . Irregular menses 11/2001  . Migraine   . Pregnancy induced hypertension   . Psoriasis   . Psychosocial stressors 10/2009    PAST SURGICAL HISTORY: Past Surgical History:  Procedure Laterality Date  . BREAST ENHANCEMENT SURGERY Bilateral 2013  . BREAST LUMPECTOMY WITH RADIOACTIVE SEED AND SENTINEL LYMPH NODE BIOPSY Left 08/21/2019   Procedure:  LEFT BREAST LUMPECTOMY WITH RADIOACTIVE SEED AND LEFT AXILLARY SENTINEL LYMPH NODE BIOPSY;  Surgeon: [name], MD;  Location: [place];  Service: General;  Laterality: Left;  . BREAST SURGERY    . CESAREAN SECTION     x 1   . RE-EXCISION OF BREAST LUMPECTOMY Left 09/11/2019   Procedure: LEFT RE-EXCISION OF BREAST MARGIN;  Surgeon: [name], MD;  Location: [place];  Service: General;  Laterality: Left;  . ROBOTIC ASSISTED BILATERAL SALPINGO OOPHERECTOMY Bilateral 03/20/2020   Procedure: XI ROBOTIC ASSISTED BILATERAL SALPINGO OOPHORECTOMY;  Surgeon: [name], MD;  Location: [place];  Service: Gynecology;  Laterality: Bilateral;  Dr. [name] requesting 2 / 2 1/2 hours    FAMILY HISTORY: Family History  Problem Relation Age of Onset  . Heart disease Mother   . Hypertension Mother   . Heart disease Father   . Depression Father   . Alcohol abuse Father   . Prostate cancer Father 31  . Heart disease Maternal Grandmother   . Heart disease Maternal Grandfather    Patient's father is 32 years old and patient's mother 64 years old (as of 07/2019) The patient denies a family hx of breast or ovarian cancer. She does report her father was recently diagnosed with prostate cancer. She has 1 sister.   GYNECOLOGIC HISTORY:  No LMP recorded. (Menstrual status: Irregular Periods). Menarche: 49 years old Age at first live birth: 49 years old [place] P 2 LMP 07/28/2019, periods are irregular Contraceptive: yes, used for more than 20 years HRT n/a  Hysterectomy? no BSO? yes   SOCIAL HISTORY: (updated 11/2019)  [name] is of [place] extraction.  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 Husband Robin Castillo is a self-employed Forensic psychologist. She lives at home with her husband and two sons: Robin Castillo  14 and [name]. She attends a local [employer]   ADVANCED  DIRECTIVES: In the absence of any documentation to the contrary, the patient's spouse is their HCPOA.    HEALTH MAINTENANCE: Social History   Tobacco Use  . Smoking status: Never Smoker  . Smokeless tobacco: Never Used  Vaping Use  . Vaping Use: Never used  Substance Use Topics  . Alcohol use: Not Currently  . Drug use: No     Colonoscopy: n/a (age)  PAP: 06/2018  Bone density: n/a (age)   No Known Allergies  Current Outpatient Medications  Medication Sig Dispense Refill  . anastrozole (ARIMIDEX) 1 MG tablet Take 1 tablet (1 mg total) by mouth daily. 90 tablet 4  . Azilsartan-Chlorthalidone (EDARBYCLOR) 40-12.5 MG TABS Take by mouth. 30 tablet   . fluocinolone (VANOS) 0.01 % cream Apply topically as needed.    . gabapentin (NEURONTIN) 300 MG capsule Take 1 capsule (300 mg total) by mouth at bedtime. 90 capsule 4   No current facility-administered medications for this visit.    OBJECTIVE: [place] woman who appears with  Vitals:   06/09/20 0940  BP: 124/65  Pulse: 88  Resp: 18  Temp: 99.5 F (37.5 C)  SpO2: 100%     Body mass index is 24.13 kg/m.   Wt Readings from Last 3 Encounters:  06/09/20 145 lb (65.8 kg)  03/20/20 142 lb (  64.4 kg)  03/14/20 141 lb 1.6 oz (64 kg)      ECOG FS:1 - Symptomatic but completely ambulatory  Sclerae unicteric, EOMs intact Wearing a mask No cervical or supraclavicular adenopathy Lungs no rales or rhonchi Heart regular rate and rhythm Abd soft, nontender, positive bowel sounds MSK no focal spinal tenderness, no upper extremity lymphedema Neuro: nonfocal, well oriented, appropriate affect Breasts: The right breast is unremarkable.  The left breast is status post lumpectomy and radiation.  There is no evidence of local recurrence.  Both axillae are benign Skin: There is a lesion on the anterior right chest above the breast imaged below, which has irregular borders and at least 2 colors.        LAB RESULTS:  CBC and c-Met  obtained through Dr. Adela Ports office were unremarkable except for a low B12, which he is replacing.  CMP     Component Value Date/Time   NA 140 03/14/2020 1053   K 3.7 03/14/2020 1053   CL 102 03/14/2020 1053   CO2 30 03/14/2020 1053   GLUCOSE 80 03/14/2020 1053   BUN 6 03/14/2020 1053   CREATININE 0.77 03/14/2020 1053   CREATININE 0.77 08/01/2019 1234   CALCIUM 9.8 03/14/2020 1053   PROT 7.4 03/14/2020 1053   ALBUMIN 4.2 03/14/2020 1053   AST 17 03/14/2020 1053   AST 15 08/01/2019 1234   ALT 16 03/14/2020 1053   ALT 13 08/01/2019 1234   ALKPHOS 68 03/14/2020 1053   BILITOT 0.4 03/14/2020 1053   BILITOT 0.3 08/01/2019 1234   GFRNONAA >60 03/14/2020 1053   GFRNONAA >60 08/01/2019 1234   GFRAA >60 03/14/2020 1053   GFRAA >60 08/01/2019 1234    No results found for: TOTALPROTELP, ALBUMINELP, A1GS, A2GS, BETS, BETA2SER, GAMS, MSPIKE, SPEI  Lab Results  Component Value Date   WBC 3.6 (L) 03/14/2020   NEUTROABS 2.0 03/14/2020   HGB 13.8 03/14/2020   HCT 42.5 03/14/2020   MCV 86.0 03/14/2020   PLT 215 03/14/2020    No results found for: LABCA2  No components found for: IRWERX540  No results for input(s): INR in the last 168 hours.  No results found for: LABCA2  No results found for: GQQ761  No results found for: PJK932  No results found for: IZT245  No results found for: CA2729  No components found for: HGQUANT  No results found for: CEA1 / No results found for: CEA1   No results found for: AFPTUMOR  No results found for: CHROMOGRNA  No results found for: KPAFRELGTCHN, LAMBDASER, KAPLAMBRATIO (kappa/lambda light chains)  No results found for: HGBA, HGBA2QUANT, HGBFQUANT, HGBSQUAN (Hemoglobinopathy evaluation)   No results found for: LDH  No results found for: IRON, TIBC, IRONPCTSAT (Iron and TIBC)  No results found for: FERRITIN  Urinalysis No results found for: COLORURINE, APPEARANCEUR, LABSPEC, PHURINE, GLUCOSEU, HGBUR, BILIRUBINUR,  KETONESUR, PROTEINUR, UROBILINOGEN, NITRITE, LEUKOCYTESUR   STUDIES: No results found.   ELIGIBLE FOR AVAILABLE RESEARCH PROTOCOL: no  ASSESSMENT: 49 y.o. [place] woman status post left breast upper outer quadrant biopsy 07/26/2019 for a clinical T1b-c N0, stage Ia invasive ductal carcinoma, grade 2, estrogen and progesterone receptor positive, HER-2 not amplified, with an MIB-1 of 2  (1) status post left lumpectomy and sentinel lymph node sampling 08/21/2019 for a pT1c pN1(mic), 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  (a) a single sentinel lymph node was removed  (b) additional surgery 09/11/2019 cleared the margins  (2) Oncotype score of  16 predicts a risk of recurrence outside the breast in the next 9 years of 15% if the patient's only systemic therapy is tamoxifen for 5 years.  It also predicts no apparent benefit from chemotherapy.  (3) adjuvant radiation 10/17/2019 through 11/13/2019 Site/dose:   The patient initially received a dose of 42.56 Gy in 16 fractions to the breast using whole-breast tangent fields. This was delivered using a 3-D conformal technique. The patient then received a boost to the seroma. This delivered an additional 10 Gy in 14fractions using an en face electron field due to the depth of the seroma. The total dose was 52.56Gy.  (4) antiestrogens for a minimum of 5 years:  (a) goserelin/Zoladex started 10/12/2019, discontinued after 02/29/2020 dose  (b) anastrozole started 11/27/2019  (c) bilateral salpingo-oophorectomy 03/20/2020  (d) bone density 05/19/2020 shows a T score of -0.1 (normal).  (5) genetics testing 08/09/2019 through the [facility] Breast Cancer STAT and Common Hereditary Cancers panels found no deleterious mutations in ATM, BRCA1, BRCA2, CDH1, CHEK2, PALB2, PTEN, STK11 and TP53.  APC, ATM, AXIN2, BARD1, BMPR1A, BRCA1, BRCA2, BRIP1, CDH1, CDK4, CDKN2A (p14ARF), CDKN2A (p16INK4a), CHEK2, CTNNA1, DICER1,  EPCAM (Deletion/duplication testing only), GREM1 (promoter region deletion/duplication testing only), KIT, MEN1, MLH1, MSH2, MSH3, MSH6, MUTYH, NBN, NF1, NHTL1, PALB2, PDGFRA, PMS2, POLD1, POLE, PTEN, RAD50, RAD51C, RAD51D, RNF43, SDHB, SDHC, SDHD, SMAD4, SMARCA4. STK11, TP53, TSC1, TSC2, and VHL.  The following genes were evaluated for sequence changes only: SDHA and HOXB13 c.251G>A variant only.  (a) A variant of uncertain significance was detected in the MSH3 gene called c.803G>A (p.Arg268Gln). The report date is 08/09/2019.   PLAN: Rya will soon be a year out from definitive surgery for her breast cancer with no evidence of disease recurrence.  This is very febrile.  She is tolerating anastrozole generally well.  As far as the nighttime hot flashes are concerned we are going to give gabapentin a try.  We discussed that extensively today and I put in the prescription for her.  Dr. Burney Gauze believes I think from his note that she has carpal tunnel.  This is working progress.  I do not think we want to change the antiestrogen until that is all resolved.  She is interested in possibly switching to exemestane at some point and we will discuss that at her next visit.  The lesion on her skin above the right breast is 2 colors and has irregular borders.  I am referring her to Dr. Denna Haggard in dermatology for further evaluation and possible removal.  Her bone density result was very favorable  She will see me again in May.  She knows to call for any other issue that may develop before that visit  Total encounter time 40 minutes.Chauncey Cruel, MD   06/09/2020 10:09 AM Medical Oncology and Hematology [facility] [place], [place] [address] Tel. [phone]    Fax. [phone]   This document serves as a record of services personally performed by Lurline Del, MD. It was created on his behalf by Wilburn Mylar, a trained medical scribe. The creation of  this record is based on the scribe's personal observations and the provider's statements to them.   I, Lurline Del MD, have reviewed the above documentation for accuracy and completeness, and I agree with the above.   *Total Encounter Time as defined by the Centers for Medicare and Medicaid Services includes, in addition to the face-to-face time of a patient visit (documented in the  note above) non-face-to-face time: obtaining and reviewing outside history, ordering and reviewing medications, tests or procedures, care coordination (communications with other health care professionals or caregivers) and documentation in the medical record.

## 2020-06-09 ENCOUNTER — Other Ambulatory Visit: Payer: Self-pay

## 2020-06-09 ENCOUNTER — Inpatient Hospital Stay: Payer: 59 | Attending: Hematology and Oncology | Admitting: Oncology

## 2020-06-09 ENCOUNTER — Encounter: Payer: Self-pay | Admitting: Oncology

## 2020-06-09 ENCOUNTER — Inpatient Hospital Stay: Payer: 59

## 2020-06-09 ENCOUNTER — Ambulatory Visit: Payer: 59 | Attending: Radiation Oncology

## 2020-06-09 VITALS — BP 124/65 | HR 88 | Temp 99.5°F | Resp 18 | Ht 65.0 in | Wt 145.0 lb

## 2020-06-09 DIAGNOSIS — Z818 Family history of other mental and behavioral disorders: Secondary | ICD-10-CM | POA: Diagnosis not present

## 2020-06-09 DIAGNOSIS — Z8042 Family history of malignant neoplasm of prostate: Secondary | ICD-10-CM | POA: Insufficient documentation

## 2020-06-09 DIAGNOSIS — Z811 Family history of alcohol abuse and dependence: Secondary | ICD-10-CM | POA: Insufficient documentation

## 2020-06-09 DIAGNOSIS — Z483 Aftercare following surgery for neoplasm: Secondary | ICD-10-CM

## 2020-06-09 DIAGNOSIS — Z170 Estrogen receptor positive status [ER+]: Secondary | ICD-10-CM

## 2020-06-09 DIAGNOSIS — R200 Anesthesia of skin: Secondary | ICD-10-CM | POA: Insufficient documentation

## 2020-06-09 DIAGNOSIS — C50412 Malignant neoplasm of upper-outer quadrant of left female breast: Secondary | ICD-10-CM

## 2020-06-09 DIAGNOSIS — M19049 Primary osteoarthritis, unspecified hand: Secondary | ICD-10-CM | POA: Diagnosis not present

## 2020-06-09 DIAGNOSIS — R232 Flushing: Secondary | ICD-10-CM | POA: Insufficient documentation

## 2020-06-09 DIAGNOSIS — Z79899 Other long term (current) drug therapy: Secondary | ICD-10-CM | POA: Diagnosis not present

## 2020-06-09 DIAGNOSIS — Z8249 Family history of ischemic heart disease and other diseases of the circulatory system: Secondary | ICD-10-CM | POA: Diagnosis not present

## 2020-06-09 DIAGNOSIS — E538 Deficiency of other specified B group vitamins: Secondary | ICD-10-CM

## 2020-06-09 MED ORDER — ANASTROZOLE 1 MG PO TABS
1.0000 mg | ORAL_TABLET | Freq: Every day | ORAL | 4 refills | Status: DC
Start: 2020-06-09 — End: 2021-01-05

## 2020-06-09 MED ORDER — GABAPENTIN 300 MG PO CAPS: 300.0000 mg | ORAL_CAPSULE | Freq: Every day | ORAL | 4 refills | Status: DC

## 2020-06-09 NOTE — Therapy (Addendum)
[place], [place], [address] Phone: [phone]   Fax:  [phone]  Physical Therapy Treatment  Patient Details  Name: Robin Castillo MRN: [id number] Date of Birth: [birth year] Referring Provider (PT): [name], PA-C   Encounter Date: 06/09/2020   PT End of Session - 06/09/20 1048    Visit Number 7   # unchanged due to screen only   Number of Visits 15    Date for PT Re-Evaluation 11/26/19    PT Start Time 7290    PT Stop Time 1051    PT Time Calculation (min) 16 min    Activity Tolerance Patient tolerated treatment well    Behavior During Therapy [facility] for tasks assessed/performed           Past Medical History:  Diagnosis Date  . Arthritis    per patient new onset in finger joints  . BV (bacterial vaginosis) 2012  . Cancer ([place]) 2021   left breast   . Cyst of ovary, right 2003  . Family history of prostate cancer in father   . H/O rubella   . H/O varicella as child  . History of PCOS 06/2005  . Hypertension   . Infertility, female   . Irregular menses 11/2001  . Migraine   . Pregnancy induced hypertension   . Psoriasis   . Psychosocial stressors 10/2009    Past Surgical History:  Procedure Laterality Date  . BREAST ENHANCEMENT SURGERY Bilateral 2013  . BREAST LUMPECTOMY WITH RADIOACTIVE SEED AND SENTINEL LYMPH NODE BIOPSY Left 08/21/2019   Procedure: LEFT BREAST LUMPECTOMY WITH RADIOACTIVE SEED AND LEFT AXILLARY SENTINEL LYMPH NODE BIOPSY;  Surgeon: [name], MD;  Location: [place];  Service: General;  Laterality: Left;  . BREAST SURGERY    . CESAREAN SECTION     x 1   . RE-EXCISION OF BREAST LUMPECTOMY Left 09/11/2019   Procedure: LEFT RE-EXCISION OF BREAST MARGIN;  Surgeon: [name], MD;  Location: [facility];  Service: General;  Laterality: Left;  . ROBOTIC ASSISTED BILATERAL SALPINGO OOPHERECTOMY Bilateral 03/20/2020    Procedure: XI ROBOTIC ASSISTED BILATERAL SALPINGO OOPHORECTOMY;  Surgeon: [name], MD;  Location: [facility];  Service: Gynecology;  Laterality: Bilateral;  Dr. [name] requesting 2 / 2 1/2 hours    There were no vitals filed for this visit.   Subjective Assessment - 06/09/20 1042    Subjective Pt returns for L-dex screen.    Pertinent History Patient was diagnosed on 07/25/2019 with left grade II invasive ductal carcinoma breast cancer. Patient reports she underwent a left lumpectomy and sentinel node biopsy (1 node removed with micromets) on 08/21/2019. It is ER/PR positive and HER2 negative with a Ki67 of 2%.                  L-DEX FLOWSHEETS - 06/09/20 1000      L-DEX LYMPHEDEMA SCREENING   Measurement Type Unilateral    L-DEX MEASUREMENT EXTREMITY Upper Extremity    POSITION  Standing    DOMINANT SIDE Right    At Risk Side Left    BASELINE SCORE (UNILATERAL) -0.8    L-DEX SCORE (UNILATERAL) 0.3    VALUE CHANGE (UNILAT) 1.1                                  PT Long Term Goals - 10/29/19 1210  PT LONG TERM GOAL #1   Title Patient will be independent with performing self manual lymph drainage for left breast to reduce swelling.    Baseline No knowledge    Time 4    Period Weeks    Status New    Target Date 11/26/19      PT LONG TERM GOAL #2   Title Patient will report >/= 50% less discomfort in her left breast after performing manual lymph drainage.    Time 4    Period Weeks    Status New    Target Date 11/26/19                 Plan - 06/09/20 1051    Clinical Impression Statement Pt returns for L-Dex screen. Her change from baseline of 1.1 is WNLs so no further treatment required at this time except to cont every 3 month L-Dex screens (she missed last appt so is resuming screens now). She wanted another prophylactic compression sleeve and gauntlet so reissued script from Dr. [name] per pts request.    PT  Next Visit Plan Return for L-dex in 3 months    Consulted and Agree with Plan of Care Patient           Patient will benefit from skilled therapeutic intervention in order to improve the following deficits and impairments:     Visit Diagnosis: Aftercare following surgery for neoplasm     Problem List Patient Active Problem List   Diagnosis Date Noted  . B12 deficiency 06/09/2020  . Genetic testing 08/10/2019  . Family history of prostate cancer in father   . Malignant neoplasm of upper-outer quadrant of left breast in female, estrogen receptor positive ([place]) 07/30/2019    [name], PTA 06/09/2020, 4:55 PM  [place] [place], [place], [address] Phone: [phone]   Fax:  [phone]  Name: Robin Castillo MRN: [id number] Date of Birth: 09/12/1970

## 2020-06-13 ENCOUNTER — Other Ambulatory Visit: Payer: Self-pay

## 2020-06-13 ENCOUNTER — Telehealth: Payer: Self-pay

## 2020-06-13 DIAGNOSIS — C50412 Malignant neoplasm of upper-outer quadrant of left female breast: Secondary | ICD-10-CM

## 2020-06-13 DIAGNOSIS — Z170 Estrogen receptor positive status [ER+]: Secondary | ICD-10-CM

## 2020-06-13 NOTE — Telephone Encounter (Signed)
Pt called to request new referral to dermatology be placed due to availability of appointments on previous referral.   Pt was not able to be seen at Dr. Onalee Hua office until March.   Pt reports she was a former patient at [facility] and would like to try this clinic first.   RN placed referral.  Successfully faxed referral request to [phone].   Pt aware, verbalized agreement to contact clinic if no availability at [facility].

## 2020-06-17 ENCOUNTER — Other Ambulatory Visit: Payer: Self-pay

## 2020-06-17 ENCOUNTER — Ambulatory Visit (INDEPENDENT_AMBULATORY_CARE_PROVIDER_SITE_OTHER): Payer: 59 | Admitting: Neurology

## 2020-06-17 DIAGNOSIS — R202 Paresthesia of skin: Secondary | ICD-10-CM

## 2020-06-17 DIAGNOSIS — G5601 Carpal tunnel syndrome, right upper limb: Secondary | ICD-10-CM

## 2020-06-17 NOTE — Procedures (Signed)
[facility]  [address]  [place], [place] [address] Tel: [phone] Fax:  [phone] Test Date:  06/17/2020  Patient: Robin Castillo DOB: 1970-11-08 Physician: Narda Amber, DO  Sex: Female Height: 5\' 5"  Ref Phys: Roseanne Kaufman  ID#: [id number]   Technician:    Patient Complaints: This is a 49 year old female referred for evaluation of bilateral hand paresthesias, worse on the right.  NCV & EMG Findings: Extensive electrodiagnostic testing of the right upper extremity shows:  1. Right median sensory response shows prolonged latency (4.0 ms).  Right ulnar sensory responses within normal limits. 2. Right median motor response shows latency (4.5 ms).  Right ulnar motor responses within normal limits.  3. Sparse chronic motor axonal loss changes are isolated to right abductor pollicis brevis muscle, without accompanying active denervation.   Impression: 1. Right median neuropathy at or distal to the wrist (moderate), consistent with a clinical diagnosis of carpal tunnel syndrome.   2. Patient declined testing of the left upper extremity.   ___________________________ Narda Amber, DO    Nerve Conduction Studies Anti Sensory Summary Table   Stim Site NR Peak (ms) Norm Peak (ms) P-T Amp (V) Norm P-T Amp  Right Median Anti Sensory (2nd Digit)  32C  Wrist    4.0 <3.4 46.9 >20  Right Ulnar Anti Sensory (5th Digit)  32C  Wrist    2.9 <3.1 56.3 >12   Motor Summary Table   Stim Site NR Onset (ms) Norm Onset (ms) O-P Amp (mV) Norm O-P Amp Site1 Site2 Delta-0 (ms) Dist (cm) Vel (m/s) Norm Vel (m/s)  Right Median Motor (Abd Poll Brev)  32C  Wrist    4.5 <3.9 9.1 >6 Elbow Wrist 5.2 29.0 56 >50  Elbow    9.7  9.1         Right Ulnar Motor (Abd Dig Minimi)  32C  Wrist    2.8 <3.1 12.3 >7 B Elbow Wrist 3.5 21.0 60 >50  B Elbow    6.3  12.2  A Elbow B Elbow 1.9 10.0 53 >50  A Elbow    8.2  12.0          EMG   Side Muscle Ins Act Fibs Psw Fasc  Number Recrt Dur Dur. Amp Amp. Poly Poly. Comment  Right 1stDorInt Nml Nml Nml Nml Nml Nml Nml Nml Nml Nml Nml Nml N/A  Right Abd Poll Brev Nml Nml Nml Nml 1- Rapid Few 1+ Few 1+ Nml Nml N/A  Right PronatorTeres Nml Nml Nml Nml Nml Nml Nml Nml Nml Nml Nml Nml N/A  Right Biceps Nml Nml Nml Nml Nml Nml Nml Nml Nml Nml Nml Nml N/A  Right Triceps Nml Nml Nml Nml Nml Nml Nml Nml Nml Nml Nml Nml N/A  Right Deltoid Nml Nml Nml Nml Nml Nml Nml Nml Nml Nml Nml Nml N/A      Waveforms:

## 2020-06-19 ENCOUNTER — Other Ambulatory Visit: Payer: BC Managed Care – PPO

## 2020-06-19 ENCOUNTER — Ambulatory Visit: Payer: BC Managed Care – PPO

## 2020-07-25 ENCOUNTER — Telehealth: Payer: Self-pay

## 2020-07-25 NOTE — Telephone Encounter (Signed)
Returned pt phone call.  Feels she has developed cording and might need to be seen for physical therapy.  Advised pt to get a new referral and schedule an evaluation.  We got cut off as she was in a parking deck in [place] and we could not hear one another.  Shirlean Mylar

## 2020-08-01 ENCOUNTER — Encounter: Payer: Self-pay | Admitting: Physical Therapy

## 2020-08-01 ENCOUNTER — Other Ambulatory Visit: Payer: Self-pay

## 2020-08-01 ENCOUNTER — Ambulatory Visit: Payer: BC Managed Care – PPO | Attending: Radiation Oncology | Admitting: Physical Therapy

## 2020-08-01 DIAGNOSIS — R600 Localized edema: Secondary | ICD-10-CM | POA: Diagnosis present

## 2020-08-01 DIAGNOSIS — R293 Abnormal posture: Secondary | ICD-10-CM | POA: Diagnosis present

## 2020-08-01 DIAGNOSIS — C50412 Malignant neoplasm of upper-outer quadrant of left female breast: Secondary | ICD-10-CM | POA: Insufficient documentation

## 2020-08-01 DIAGNOSIS — Z170 Estrogen receptor positive status [ER+]: Secondary | ICD-10-CM | POA: Insufficient documentation

## 2020-08-01 DIAGNOSIS — Z9189 Other specified personal risk factors, not elsewhere classified: Secondary | ICD-10-CM | POA: Diagnosis present

## 2020-08-01 DIAGNOSIS — Z483 Aftercare following surgery for neoplasm: Secondary | ICD-10-CM | POA: Diagnosis not present

## 2020-08-01 DIAGNOSIS — L599 Disorder of the skin and subcutaneous tissue related to radiation, unspecified: Secondary | ICD-10-CM | POA: Diagnosis present

## 2020-08-01 NOTE — Therapy (Signed)
[place], [place], [address] Phone: [phone]   Fax:  [phone]  Physical Therapy Evaluation  Patient Details  Name: Robin Castillo MRN: [id number] Date of Birth: 08/27/1970 Referring Provider (PT): Dr. Donne Hazel   Encounter Date: 08/01/2020   PT End of Session - 08/01/20 1301    Visit Number 1    Number of Visits 9    Date for PT Re-Evaluation 08/29/20    PT Start Time 1000    PT Stop Time 1050    PT Time Calculation (min) 50 min    Activity Tolerance Patient tolerated treatment well    Behavior During Therapy [facility] for tasks assessed/performed           Past Medical History:  Diagnosis Date  . Arthritis    per patient new onset in finger joints  . BV (bacterial vaginosis) 2012  . Cancer ([place]) 2021   left breast   . Cyst of ovary, right 2003  . Family history of prostate cancer in father   . H/O rubella   . H/O varicella as child  . History of PCOS 06/2005  . Hypertension   . Infertility, female   . Irregular menses 11/2001  . Migraine   . Pregnancy induced hypertension   . Psoriasis   . Psychosocial stressors 10/2009    Past Surgical History:  Procedure Laterality Date  . BREAST ENHANCEMENT SURGERY Bilateral 2013  . BREAST LUMPECTOMY WITH RADIOACTIVE SEED AND SENTINEL LYMPH NODE BIOPSY Left 08/21/2019   Procedure: LEFT BREAST LUMPECTOMY WITH RADIOACTIVE SEED AND LEFT AXILLARY SENTINEL LYMPH NODE BIOPSY;  Surgeon: [name], MD;  Location: [place];  Service: General;  Laterality: Left;  . BREAST SURGERY    . CESAREAN SECTION     x 1   . RE-EXCISION OF BREAST LUMPECTOMY Left 09/11/2019   Procedure: LEFT RE-EXCISION OF BREAST MARGIN;  Surgeon: [name], MD;  Location: [place];  Service: General;  Laterality: Left;  . ROBOTIC ASSISTED BILATERAL SALPINGO OOPHERECTOMY Bilateral 03/20/2020   Procedure: XI ROBOTIC ASSISTED BILATERAL  SALPINGO OOPHORECTOMY;  Surgeon: [name], MD;  Location: [place];  Service: Gynecology;  Laterality: Bilateral;  Dr. [name] requesting 2 / 2 1/2 hours    There were no vitals filed for this visit.    Subjective Assessment - 08/01/20 1024    Subjective Pt comes to PT with onset of cording  a few weeks ago of unknown cause. She has been working out with cycling and recent onset of swimming with freestyle and a little backstroke and weight machines a the gym for doing latt  strech and pec stretch to try to help herself.  She has been using her compression sleeve when she exercises her arms and has been using short episodes of moist heat to help decrease the inflammation around the cord.    Pertinent History Patient was diagnosed on 07/25/2019 with left grade II invasive ductal carcinoma breast cancer. Patient reports she underwent a left lumpectomy and sentinel node biopsy (1 node removed with micromets) on 08/21/2019. It is ER/PR positive and HER2 negative with a Ki67 of 2%.She had 20 radiation treatments with booster past histroy of breat implants in 2013, possible psoriatic arthritis and possible carpal tunnel on the right    Patient Stated Goals to get rid of the cording    Currently in Pain? No/denies  [facility] PT Assessment - 08/01/20 0001      Assessment   Medical Diagnosis lymphedema left breast    Referring Provider (PT) Dr. Donne Hazel    Onset Date/Surgical Date 10/19/19    Hand Dominance Right    Prior Therapy yes for breast lymphedema in 2021      Precautions   Precautions Other (comment)    Precaution Comments previous breast implant, lumpectomy with sentinel node and radiation      Restrictions   Weight Bearing Restrictions No      Balance Screen   Has the patient fallen in the past 6 months No    Has the patient had a decrease in activity level because of a fear of falling?  No    Is the patient reluctant to leave their home because of a  fear of falling?  No      Home Social worker Private residence    Living Arrangements Children;Spouse/significant other   Husband, 80 and 85 y.o. 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    Leisure stationary cycle at the gym with occasional swimming and weight machines 3-5 a week for at least one hour      Cognition   Overall Cognitive Status Within Functional Limits for tasks assessed      Observation/Other Assessments   Observations guitar string visible cord in left axilla into upper arm    Other Surveys  Quick Dash    Quick DASH  13.64      Coordination   Gross Motor Movements are Fluid and Coordinated Yes      Posture/Postural Control   Posture/Postural Control Postural limitations    Postural Limitations Rounded Shoulders;Forward head      ROM / Strength   AROM / PROM / Strength AROM;Strength      AROM   Overall AROM  Within functional limits for tasks performed    Overall AROM Comments Pt states she had a lot of tightness in her left shoulder after surgery, but it is now improved , scapulae move symmetrically    Left Shoulder Extension --    Left Shoulder Flexion --    Left Shoulder ABduction --    Left Shoulder Internal Rotation --    Left Shoulder External Rotation --      Strength   Overall Strength Within functional limits for tasks performed      Palpation   Palpation comment guitar string is moveable to tissue bending                    Quick Dash - 08/01/20 0001    Open a tight or new jar Mild difficulty    Do heavy household chores (wash walls, wash floors) No difficulty    Carry a shopping bag or briefcase No difficulty    Wash your back Mild difficulty    Use a knife to cut food No difficulty    Recreational activities in which you take some force or impact  through your arm, shoulder, or hand (golf, hammering, tennis) Mild difficulty    During the past week, to what extent has your arm, shoulder or hand problem interfered with your normal social activities with family, friends, neighbors, or groups? Slightly    During the past week, to what extent  has your arm, shoulder or hand problem limited your work or other regular daily activities Slightly    Arm, shoulder, or hand pain. None    Tingling (pins and needles) in your arm, shoulder, or hand Mild    Difficulty Sleeping No difficulty    DASH Score 13.64 %            Objective measurements completed on examination: See above findings.       [facility] Adult PT Treatment/Exercise - 08/01/20 0001      Manual Therapy   Manual Therapy Myofascial release;Scapular mobilization    Soft tissue mobilization gentle tissue bending along length of cord to lessen adhesions    Myofascial Release began myofascial release along cord longitudinally                       PT Long Term Goals - 08/01/20 1307      PT LONG TERM GOAL #1   Title Patient will be independent with performing self manual techniques and stretching to decrease axillary cording    Time 4    Status New      PT LONG TERM GOAL #2   Title Patient will report >/= 50% less evidence of axiallary cording per her self assessment    Period Weeks    Status New      PT LONG TERM GOAL #3   Title Pt will decreasae Quick DASH score to < 10 demonstrating an improvement in function of her left arm    Baseline 13.64 on 08/01/2020    Time 4    Period Weeks    Status New                  Plan - 08/01/20 1301    Clinical Impression Statement Pt comes to PT with recent onset fo axillary cording about 10 months after surgery. She has been doing self care at home and her symptoms have decreased but she would like more intensive treatment and instruction about what else she can do at home to lessen the cording    Personal Factors  and Comorbidities Comorbidity 3+    Comorbidities breast impant, lumpectomy with sentinel node removeal, radiation, psoriasis with possible psoriatic arthritis    Examination-Activity Limitations Other   pt reports intermittent functional limitations   Stability/Clinical Decision Making Stable/Uncomplicated    Clinical Decision Making Low    Rehab Potential Excellent    PT Frequency 2x / week    PT Duration 4 weeks    PT Treatment/Interventions ADLs/Self Care Home Management;DME Instruction;Therapeutic activities;Therapeutic exercise;Neuromuscular re-education;Orthotic Fit/Training;Patient/family education;Compression bandaging;Manual lymph drainage;Manual techniques;Scar mobilization;Passive range of motion;Dry needling;Taping    PT Next Visit Plan Myofascial release and soft tissue work with neural stretches and instruction in home managment of cording    Consulted and Agree with Plan of Care Patient           Patient will benefit from skilled therapeutic intervention in order to improve the following deficits and impairments:  Increased fascial restricitons,Postural dysfunction,Impaired UE functional use  Visit Diagnosis: Aftercare following surgery for neoplasm - Plan: PT plan of care cert/re-cert  Disorder of the skin and subcutaneous tissue related to radiation, unspecified - Plan: PT plan of care cert/re-cert     Problem List Patient Active Problem List   Diagnosis Date Noted  . B12 deficiency 06/09/2020  . Genetic testing 08/10/2019  . Family history of prostate cancer in father   . Malignant neoplasm of upper-outer  quadrant of left breast in female, estrogen receptor positive ([place]) 07/30/2019   Donato Heinz. Owens Shark PT  Norwood Levo 08/01/2020, 1:12 PM  [place], [place], [address] Phone: [phone]   Fax:  [phone]  Name: Robin Castillo MRN: [id number] Date of Birth: 1970-12-26

## 2020-08-07 ENCOUNTER — Ambulatory Visit: Payer: BC Managed Care – PPO

## 2020-08-12 ENCOUNTER — Ambulatory Visit: Payer: BC Managed Care – PPO

## 2020-08-12 ENCOUNTER — Other Ambulatory Visit: Payer: Self-pay

## 2020-08-12 DIAGNOSIS — R293 Abnormal posture: Secondary | ICD-10-CM

## 2020-08-12 DIAGNOSIS — R600 Localized edema: Secondary | ICD-10-CM

## 2020-08-12 DIAGNOSIS — L599 Disorder of the skin and subcutaneous tissue related to radiation, unspecified: Secondary | ICD-10-CM

## 2020-08-12 DIAGNOSIS — Z483 Aftercare following surgery for neoplasm: Secondary | ICD-10-CM | POA: Diagnosis not present

## 2020-08-12 DIAGNOSIS — Z170 Estrogen receptor positive status [ER+]: Secondary | ICD-10-CM

## 2020-08-12 DIAGNOSIS — Z9189 Other specified personal risk factors, not elsewhere classified: Secondary | ICD-10-CM

## 2020-08-12 NOTE — Therapy (Signed)
[place], [place], [address] Phone: [phone]   Fax:  [phone]  Physical Therapy Treatment  Patient Details  Name: Robin Castillo MRN: [id number] Date of Birth: January 16, 1971 Referring Provider (PT): Dr. Donne Hazel   Encounter Date: 08/12/2020   PT End of Session - 08/12/20 1004    Visit Number 2    Number of Visits 9    Date for PT Re-Evaluation 08/29/20    PT Start Time 0910    PT Stop Time 0956    PT Time Calculation (min) 46 min    Activity Tolerance Patient tolerated treatment well    Behavior During Therapy [facility] for tasks assessed/performed           Past Medical History:  Diagnosis Date  . Arthritis    per patient new onset in finger joints  . BV (bacterial vaginosis) 2012  . Cancer ([place]) 2021   left breast   . Cyst of ovary, right 2003  . Family history of prostate cancer in father   . H/O rubella   . H/O varicella as child  . History of PCOS 06/2005  . Hypertension   . Infertility, female   . Irregular menses 11/2001  . Migraine   . Pregnancy induced hypertension   . Psoriasis   . Psychosocial stressors 10/2009    Past Surgical History:  Procedure Laterality Date  . BREAST ENHANCEMENT SURGERY Bilateral 2013  . BREAST LUMPECTOMY WITH RADIOACTIVE SEED AND SENTINEL LYMPH NODE BIOPSY Left 08/21/2019   Procedure: LEFT BREAST LUMPECTOMY WITH RADIOACTIVE SEED AND LEFT AXILLARY SENTINEL LYMPH NODE BIOPSY;  Surgeon: [name], MD;  Location: [place];  Service: General;  Laterality: Left;  . BREAST SURGERY    . CESAREAN SECTION     x 1   . RE-EXCISION OF BREAST LUMPECTOMY Left 09/11/2019   Procedure: LEFT RE-EXCISION OF BREAST MARGIN;  Surgeon: [name], MD;  Location: [place];  Service: General;  Laterality: Left;  . ROBOTIC ASSISTED BILATERAL SALPINGO OOPHERECTOMY Bilateral 03/20/2020   Procedure: XI ROBOTIC ASSISTED BILATERAL  SALPINGO OOPHORECTOMY;  Surgeon: [name], MD;  Location: [place];  Service: Gynecology;  Laterality: Bilateral;  Dr. [name] requesting 2 / 2 1/2 hours    There were no vitals filed for this visit.   Subjective Assessment - 08/12/20 0906    Subjective Cord is still there.  Want to learn what things I can do to make it go away. sometimes have soreness at medial arm but not presently.   Pertinent History Patient was diagnosed on 07/25/2019 with left grade II invasive ductal carcinoma breast cancer. Patient reports she underwent a left lumpectomy and sentinel node biopsy (1 node removed with micromets) on 08/21/2019. It is ER/PR positive and HER2 negative with a Ki67 of 2%.She had 20 radiation treatments with booster past histroy of breat implants in 2013, possible psoriatic arthritis and possible carpal tunnel on the right    Patient Stated Goals to get rid of the cording    Currently in Pain? No/denies                             [facility] Adult PT Treatment/Exercise - 08/12/20 0001      Manual Therapy   Manual Therapy Myofascial release;Scapular mobilization    Soft tissue mobilization gentle tissue bending along length of cord to lessen adhesions    Myofascial Release myofascial  release along cord longitudinally. cord movement with "S", up and down with arm in varying degrees of abd                  PT Education - 08/12/20 1003    Education Details Pt was educated how to pin one side of cord and the other end of cord using thumbs so her husband can do in various degrees of Abd ROM    Person(s) Educated Patient    Methods Explanation;Demonstration    Comprehension Verbalized understanding               PT Long Term Goals - 08/01/20 1307      PT LONG TERM GOAL #1   Title Patient will be independent with performing self manual techniques and stretching to decrease axillary cording    Time 4    Status New      PT LONG TERM GOAL #2    Title Patient will report >/= 50% less evidence of axiallary cording per her self assessment    Period Weeks    Status New      PT LONG TERM GOAL #3   Title Pt will decreasae Quick DASH score to < 10 demonstrating an improvement in function of her left arm    Baseline 13.64 on 08/01/2020    Time 4    Period Weeks    Status New                 Plan - 08/12/20 1005    Clinical Impression Statement Pt returns for PT to work on cording.  Therapy consisted of manual work with MFR and pinning done in various ROM of abd to get the most stretch, in addition to S movement of cord and up and down movement.  Discussed how to teach her husband to do MFR as well.  Pt. had multiple questions and had done research herself.  She has done an excellent job working on her shoulder ROM.  She is not functionally limited by cording.  Pt plans to hold on therapy and continue work on cording at home.    Personal Factors and Comorbidities Comorbidity 3+    Comorbidities breast impant, lumpectomy with sentinel node removeal, radiation, psoriasis with possible psoriatic arthritis    Examination-Activity Limitations Other    Stability/Clinical Decision Making Stable/Uncomplicated    Rehab Potential Excellent    PT Frequency 2x / week    PT Duration 4 weeks    PT Treatment/Interventions ADLs/Self Care Home Management;DME Instruction;Therapeutic activities;Therapeutic exercise;Neuromuscular re-education;Orthotic Fit/Training;Patient/family education;Compression bandaging;Manual lymph drainage;Manual techniques;Scar mobilization;Passive range of motion;Dry needling;Taping    PT Next Visit Plan Myofascial release and soft tissue work with neural stretches and instruction in home managment of cording    Consulted and Agree with Plan of Care Patient           Patient will benefit from skilled therapeutic intervention in order to improve the following deficits and impairments:  Increased fascial  restricitons,Postural dysfunction,Impaired UE functional use  Visit Diagnosis: Aftercare following surgery for neoplasm  Disorder of the skin and subcutaneous tissue related to radiation, unspecified  Malignant neoplasm of upper-outer quadrant of left breast in female, estrogen receptor positive ([facility])  Abnormal posture  Localized edema  At risk for lymphedema     Problem List Patient Active Problem List   Diagnosis Date Noted  . B12 deficiency 06/09/2020  . Genetic testing 08/10/2019  . Family history of prostate cancer in father   .  Malignant neoplasm of upper-outer quadrant of left breast in female, estrogen receptor positive ([place]) 07/30/2019    Claris Pong 08/12/2020, 10:11 AM  [place], [place], [address] Phone: [phone]   Fax:  [phone]  Name: Robin Castillo MRN: [id number] Date of Birth: July 16, 1970  Cheral Almas, PT 08/12/20 10:13 AM

## 2020-09-08 ENCOUNTER — Ambulatory Visit: Payer: BC Managed Care – PPO | Attending: Radiation Oncology

## 2020-09-08 ENCOUNTER — Other Ambulatory Visit: Payer: Self-pay

## 2020-09-08 DIAGNOSIS — Z483 Aftercare following surgery for neoplasm: Secondary | ICD-10-CM | POA: Insufficient documentation

## 2020-09-08 NOTE — Therapy (Signed)
[place], [place], [address] Phone: [phone]   Fax:  [phone]  Physical Therapy Treatment  Patient Details  Name: Robin Castillo MRN: [id number] Date of Birth: [birth year] Referring Provider (PT): Dr. Donne Hazel   Encounter Date: 09/08/2020   PT End of Session - 09/08/20 0929    Visit Number 2   # unchanged due to screen only   Number of Visits 9    Date for PT Re-Evaluation 08/29/20    PT Start Time 0917    PT Stop Time 0928    PT Time Calculation (min) 11 min    Activity Tolerance Patient tolerated treatment well    Behavior During Therapy [facility] for tasks assessed/performed           Past Medical History:  Diagnosis Date  . Arthritis    per patient new onset in finger joints  . BV (bacterial vaginosis) 2012  . Cancer ([place]) 2021   left breast   . Cyst of ovary, right 2003  . Family history of prostate cancer in father   . H/O rubella   . H/O varicella as child  . History of PCOS 06/2005  . Hypertension   . Infertility, female   . Irregular menses 11/2001  . Migraine   . Pregnancy induced hypertension   . Psoriasis   . Psychosocial stressors 10/2009    Past Surgical History:  Procedure Laterality Date  . BREAST ENHANCEMENT SURGERY Bilateral 2013  . BREAST LUMPECTOMY WITH RADIOACTIVE SEED AND SENTINEL LYMPH NODE BIOPSY Left 08/21/2019   Procedure: LEFT BREAST LUMPECTOMY WITH RADIOACTIVE SEED AND LEFT AXILLARY SENTINEL LYMPH NODE BIOPSY;  Surgeon: [name], MD;  Location: [place];  Service: General;  Laterality: Left;  . BREAST SURGERY    . CESAREAN SECTION     x 1   . RE-EXCISION OF BREAST LUMPECTOMY Left 09/11/2019   Procedure: LEFT RE-EXCISION OF BREAST MARGIN;  Surgeon: [name], MD;  Location: [place];  Service: General;  Laterality: Left;  . ROBOTIC ASSISTED BILATERAL SALPINGO OOPHERECTOMY Bilateral 03/20/2020   Procedure: XI  ROBOTIC ASSISTED BILATERAL SALPINGO OOPHORECTOMY;  Surgeon: [name], MD;  Location: [place];  Service: Gynecology;  Laterality: Bilateral;  Dr. [name] requesting 2 / 2 1/2 hours    There were no vitals filed for this visit.   Subjective Assessment - 09/08/20 0922    Subjective Pt returns for her 3 month (1 year out from baseline today) L-dex screen. "My son popped my cord strtching my arm! I was so happy and it felt so much better after."    Pertinent History Patient was diagnosed on 07/25/2019 with left grade II invasive ductal carcinoma breast cancer. Patient reports she underwent a left lumpectomy and sentinel node biopsy (1 node removed with micromets) on 08/21/2019. It is ER/PR positive and HER2 negative with a Ki67 of 2%.She had 20 radiation treatments with booster past histroy of breat implants in 2013, possible psoriatic arthritis and possible carpal tunnel on the right                  L-DEX FLOWSHEETS - 09/08/20 0900      L-DEX LYMPHEDEMA SCREENING   Measurement Type Unilateral    L-DEX MEASUREMENT EXTREMITY Upper Extremity    POSITION  Standing    DOMINANT SIDE Right    At Risk Side Left    BASELINE SCORE (UNILATERAL) -0.8    L-DEX SCORE (UNILATERAL) -1.1  VALUE CHANGE (UNILAT) -0.3                                  PT Long Term Goals - 08/01/20 1307      PT LONG TERM GOAL #1   Title Patient will be independent with performing self manual techniques and stretching to decrease axillary cording    Time 4    Status New      PT LONG TERM GOAL #2   Title Patient will report >/= 50% less evidence of axiallary cording per her self assessment    Period Weeks    Status New      PT LONG TERM GOAL #3   Title Pt will decreasae Quick DASH score to < 10 demonstrating an improvement in function of her left arm    Baseline 13.64 on 08/01/2020    Time 4    Period Weeks    Status New                 Plan - 09/08/20  0930    Clinical Impression Statement Pt returns for her 3 month L-Dex screen. She is now 1 full year out from baseline. Her change from baseline of -0.3 is WNLS so no further treatment is required at this time except to cont every 3 month L-Dex screens which pt is agreeable to.    PT Next Visit Plan Cont every 3 month L-Dex screens.    Consulted and Agree with Plan of Care Patient           Patient will benefit from skilled therapeutic intervention in order to improve the following deficits and impairments:     Visit Diagnosis: Aftercare following surgery for neoplasm     Problem List Patient Active Problem List   Diagnosis Date Noted  . B12 deficiency 06/09/2020  . Genetic testing 08/10/2019  . Family history of prostate cancer in father   . Malignant neoplasm of upper-outer quadrant of left breast in female, estrogen receptor positive ([place]) 07/30/2019    [name], PTA 09/08/2020, 9:31 AM  [place], [place], [address] Phone: [phone]   Fax:  [phone]  Name: Robin Castillo MRN: [id number] Date of Birth: 08/20/1970

## 2020-11-14 ENCOUNTER — Encounter: Payer: Self-pay | Admitting: Pharmacist

## 2020-11-17 NOTE — Progress Notes (Signed)
[facility]astroenterology [facility]  Telephone:(336) [phone] Fax:(336) [phone]     ID: Robin Castillo DOB: August 07, 1970  MR#: [id number]  DOO#:[id number]  Patient Care Team: Ralene Ok, MD as PCP - General (Internal Medicine) Donnelly Angelica, RN as Oncology Nurse Navigator Pershing Proud, RN as Oncology Nurse Navigator Dorothy Puffer, MD as Consulting Physician (Radiation Oncology) Emelia Loron, MD as Consulting Physician (General Surgery) Tayna Smethurst, Valentino Hue, MD as Consulting Physician (Oncology) Henreitta Leber, PA-C as Consulting Physician (Obstetrics and Gynecology) Lyn Records, MD as Consulting Physician (Cardiology) Silverio Lay, MD as Consulting Physician (Obstetrics and Gynecology) Dairl Ponder, MD as Consulting Physician (Orthopedic Surgery) Lowella Dell, MD OTHER MD:  CHIEF COMPLAINT: Invasive ductal breast cancer, estrogen receptor positive  CURRENT TREATMENT: [anastrozole]   INTERVAL HISTORY: [name] returns today for follow up of her estrogen receptor positive breast cancer.   She continues on anastrozole.  She has had hot flashes with this.  She finds gabapentin is useful when she takes it at night.  She is also avoiding sugar and caffeine which also helps.  She tries not to take gabapentin except very exceptionally.  By doing all that she has managed to get the hot flashes under control.  However she continues to have significant arthritis issues involving the hands.  She is being evaluated for this by Dr. Dierdre Forth.  She tells me he feels she does not have rheumatoid arthritis but she does have a history of psoriasis so psoriatic arthritis is a possibility.  Carpal tunnel particularly in the left side is also a possibility and then there is the issue that anastrozole certainly can cause very similar symptoms.  Her most recent bone density ordered by Dr. Ludwig Clarks on 05/19/2020 found a T score of -0.1.  He also obtained a TSH on her 06/05/2020 which was 0.77 and as  she did not want to have that repeated today   REVIEW OF SYSTEMS: Tuleen had the skin lesion noted in the last visit removed.  It was benign.  She has a small skin keloid there.  She exercises on her bike (both stationary and outside) at least an hour a day.  She tells me that by wearing wrist splints every night as she has gotten rid of the numbness in her fingers particularly on the left side.  A detailed review of systems today was otherwise stable.   COVID 19 VACCINATION STATUS: Status post Pfizer x2 most recently May 2021   HISTORY OF CURRENT ILLNESS: From the original intake note:  Robin Castillo presented with a palpable left breast lump. She underwent bilateral diagnostic mammography with tomography and left breast ultrasonography at [facility] on 07/25/2019 showing: breast density category B; mass with associated clustered calcifications (overall dimension up to 1.2 cm on mammogram and 0.9 cm on ultrasound) within upper-outer left breast at 1 o'clock; left axillary lymph node appears within normal limits.  Accordingly on 07/26/2019 she proceeded to biopsy of the left breast area in question. The pathology from this procedure ([id number]) showed: invasive ductal carcinoma, grade 2. Prognostic indicators significant for: estrogen receptor, 70% positive with moderate staining intensity and progesterone receptor, 95% positive with strong staining intensity. Proliferation marker Ki67 at 2%. HER2 negative by immunohistochemistry (1+).  The patient's subsequent history is as detailed below.   PAST MEDICAL HISTORY: Past Medical History:  Diagnosis Date  . Arthritis    per patient new onset in finger joints  . BV (bacterial vaginosis) 2012  . Cancer (HCC) 2021  left breast   . Cyst of ovary, right 2003  . Family history of prostate cancer in father   . H/O rubella   . H/O varicella as child  . History of PCOS 06/2005  . Hypertension   . Infertility, female   . Irregular  menses 11/2001  . Migraine   . Pregnancy induced hypertension   . Psoriasis   . Psychosocial stressors 10/2009    PAST SURGICAL HISTORY: Past Surgical History:  Procedure Laterality Date  . BREAST ENHANCEMENT SURGERY Bilateral 2013  . BREAST LUMPECTOMY WITH RADIOACTIVE SEED AND SENTINEL LYMPH NODE BIOPSY Left 08/21/2019   Procedure: LEFT BREAST LUMPECTOMY WITH RADIOACTIVE SEED AND LEFT AXILLARY SENTINEL LYMPH NODE BIOPSY;  Surgeon: [name], MD;  Location: [place];  Service: General;  Laterality: Left;  . BREAST SURGERY    . CESAREAN SECTION     x 1   . RE-EXCISION OF BREAST LUMPECTOMY Left 09/11/2019   Procedure: LEFT RE-EXCISION OF BREAST MARGIN;  Surgeon: [name], MD;  Location: [place];  Service: General;  Laterality: Left;  . ROBOTIC ASSISTED BILATERAL SALPINGO OOPHERECTOMY Bilateral 03/20/2020   Procedure: XI ROBOTIC ASSISTED BILATERAL SALPINGO OOPHORECTOMY;  Surgeon: [name], MD;  Location: [place];  Service: Gynecology;  Laterality: Bilateral;  Dr. [name] requesting 2 / 2 1/2 hours    FAMILY HISTORY: Family History  Problem Relation Age of Onset  . Heart disease Mother   . Hypertension Mother   . Heart disease Father   . Depression Father   . Alcohol abuse Father   . Prostate cancer Father 72  . Heart disease Maternal Grandmother   . Heart disease Maternal Grandfather   Patient's father is 67 years old and patient's mother 81 years old (as of 07/2019) The patient denies a family hx of breast or ovarian cancer. She does report her father was recently diagnosed with prostate cancer. She has 1 sister.   GYNECOLOGIC HISTORY:  No LMP recorded. (Menstrual status: Other). Menarche: 50 years old Age at first live birth: 50 years old Millen P 2 LMP 07/28/2019, periods are irregular Contraceptive: yes, used for more than 20 years HRT n/a  Hysterectomy? no BSO? yes   SOCIAL HISTORY: (updated 11/2019)   Faiza is of [place] extraction.  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 Husband [name] is a self-employed Forensic psychologist. She lives at home with her husband and two sons: [name]  14 and [name]. She attends a local [employer]   ADVANCED DIRECTIVES: In the absence of any documentation to the contrary, the patient's spouse is their HCPOA.    HEALTH MAINTENANCE: Social History   Tobacco Use  . Smoking status: Never Smoker  . Smokeless tobacco: Never Used  Vaping Use  . Vaping Use: Never used  Substance Use Topics  . Alcohol use: Not Currently  . Drug use: No     Colonoscopy: n/a (age)  PAP: 06/2018  Bone density: n/a (age)   No Known Allergies  Current Outpatient Medications  Medication Sig Dispense Refill  . anastrozole (ARIMIDEX) 1 MG tablet Take 1 tablet (1 mg total) by mouth daily. 90 tablet 4  . Azilsartan-Chlorthalidone (EDARBYCLOR) 40-12.5 MG TABS Take by mouth. 30 tablet   . fluocinolone (VANOS) 0.01 % cream Apply topically as needed.    . gabapentin (NEURONTIN) 300 MG capsule Take 1 capsule (300 mg total) by mouth  at bedtime. 90 capsule 4   No current facility-administered medications for this visit.    OBJECTIVE: [place] woman who appears stated age  47:   11/18/20 0901  BP: 109/79  Pulse: 96  Resp: 18  Temp: (!) 97.5 F (36.4 C)  SpO2: 100%     Body mass index is 24.56 kg/m.   Wt Readings from Last 3 Encounters:  11/18/20 147 lb 9.6 oz (67 kg)  06/09/20 145 lb (65.8 kg)  03/20/20 142 lb (64.4 kg)      ECOG FS:1 - Symptomatic but completely ambulatory  Sclerae unicteric, EOMs intact Wearing a mask No cervical or supraclavicular adenopathy Lungs no rales or rhonchi Heart regular rate and rhythm Abd soft, nontender, positive bowel sounds MSK no focal spinal tenderness, no upper extremity lymphedema Neuro: nonfocal, well oriented, appropriate affect Breasts: I do not palpate any  abnormality in either breast.  Specifically the left breast is status post lumpectomy and radiation with no evidence of local recurrence.  Both axillae are benign.   LAB RESULTS:  CMP     Component Value Date/Time   NA 141 11/18/2020 0838   K 3.5 11/18/2020 0838   CL 103 11/18/2020 0838   CO2 28 11/18/2020 0838   GLUCOSE 91 11/18/2020 0838   BUN 10 11/18/2020 0838   CREATININE 0.81 11/18/2020 0838   CREATININE 0.77 08/01/2019 1234   CALCIUM 10.0 11/18/2020 0838   PROT 7.6 11/18/2020 0838   ALBUMIN 4.3 11/18/2020 0838   AST 19 11/18/2020 0838   AST 15 08/01/2019 1234   ALT 17 11/18/2020 0838   ALT 13 08/01/2019 1234   ALKPHOS 85 11/18/2020 0838   BILITOT 0.7 11/18/2020 0838   BILITOT 0.3 08/01/2019 1234   GFRNONAA >60 11/18/2020 0838   GFRNONAA >60 08/01/2019 1234   GFRAA >60 03/14/2020 1053   GFRAA >60 08/01/2019 1234    No results found for: TOTALPROTELP, ALBUMINELP, A1GS, A2GS, BETS, BETA2SER, GAMS, MSPIKE, SPEI  Lab Results  Component Value Date   WBC 4.4 11/18/2020   NEUTROABS 2.4 11/18/2020   HGB 13.4 11/18/2020   HCT 40.2 11/18/2020   MCV 88.4 11/18/2020   PLT 219 11/18/2020    No results found for: LABCA2  No components found for: AJOINO676  No results for input(s): INR in the last 168 hours.  No results found for: LABCA2  No results found for: HMC947  No results found for: SJG283  No results found for: MOQ947  No results found for: CA2729  No components found for: HGQUANT  No results found for: CEA1 / No results found for: CEA1   No results found for: AFPTUMOR  No results found for: CHROMOGRNA  No results found for: KPAFRELGTCHN, LAMBDASER, KAPLAMBRATIO (kappa/lambda light chains)  No results found for: HGBA, HGBA2QUANT, HGBFQUANT, HGBSQUAN (Hemoglobinopathy evaluation)   No results found for: LDH  No results found for: IRON, TIBC, IRONPCTSAT (Iron and TIBC)  No results found for: FERRITIN  Urinalysis No results found for:  COLORURINE, APPEARANCEUR, LABSPEC, PHURINE, GLUCOSEU, HGBUR, BILIRUBINUR, KETONESUR, PROTEINUR, UROBILINOGEN, NITRITE, LEUKOCYTESUR   STUDIES: No results found.   ELIGIBLE FOR AVAILABLE RESEARCH PROTOCOL: no  ASSESSMENT: 50 y.o. [place] woman status post left breast upper outer quadrant biopsy 07/26/2019 for a clinical T1b-c N0, stage Ia invasive ductal carcinoma, grade 2, estrogen and progesterone receptor positive, HER-2 not amplified, with an MIB-1 of 2  (1) status post left lumpectomy and sentinel lymph node sampling 08/21/2019 for a pT1c pN1(mic), stage IA invasive ductal  carcinoma grade 2, with a positive anterior/inferior margin.  (a) a single sentinel lymph node was removed  (b) additional surgery 09/11/2019 cleared the margins  (2) Oncotype score of 16 predicts a risk of recurrence outside the breast in the next 9 years of 15% if the patient's only systemic therapy is tamoxifen for 5 years.  It also predicts no apparent benefit from chemotherapy.  (3) adjuvant radiation 10/17/2019 through 11/13/2019 Site/dose:   The patient initially received a dose of 42.56 Gy in 16 fractions to the breast using whole-breast tangent fields. This was delivered using a 3-D conformal technique. The patient then received a boost to the seroma. This delivered an additional 10 Gy in 59fractions using an en face electron field due to the depth of the seroma. The total dose was 52.56Gy.  (4) antiestrogens for a minimum of 5 years:  (a) goserelin/Zoladex started 10/12/2019, discontinued after 02/29/2020 dose  (b) anastrozole started 11/27/2019  (c) bilateral salpingo-oophorectomy 03/20/2020  (d) bone density 05/19/2020 shows a T score of -0.1 (normal).  (5) genetics testing 08/09/2019 through the [facility] Breast Cancer STAT and Common Hereditary Cancers panels found no deleterious mutations in ATM, BRCA1, BRCA2, CDH1, CHEK2, PALB2, PTEN, STK11 and TP53.  APC, ATM, AXIN2, BARD1, BMPR1A, BRCA1, BRCA2, BRIP1,  CDH1, CDK4, CDKN2A (p14ARF), CDKN2A (p16INK4a), CHEK2, CTNNA1, DICER1, EPCAM (Deletion/duplication testing only), GREM1 (promoter region deletion/duplication testing only), KIT, MEN1, MLH1, MSH2, MSH3, MSH6, MUTYH, NBN, NF1, NHTL1, PALB2, PDGFRA, PMS2, POLD1, POLE, PTEN, RAD50, RAD51C, RAD51D, RNF43, SDHB, SDHC, SDHD, SMAD4, SMARCA4. STK11, TP53, TSC1, TSC2, and VHL.  The following genes were evaluated for sequence changes only: SDHA and HOXB13 c.251G>A variant only.  (a) A variant of uncertain significance was detected in the MSH3 gene called c.803G>A (p.Arg268Gln). The report date is 08/09/2019.   PLAN: [name] is a little over a year out from definitive surgery for her breast cancer with no evidence of disease recurrence.  This is very favorable.  It is impossible to tell how much the anastrozole is contributing to the feeling she has in her hands particularly in the morning.  The only way to tell is to stop the anastrozole.  I reassured her this is very safe.  I suggested she do it for 8 weeks but she agreed to a 6 weeks moratorium on anastrozole.  She will have a virtual visit with me at that point to decide whether his symptoms have changed at all or not.  If she is no better in 6 months and we will simply go back to anastrozole.  Otherwise we will likely change to exemestane.  In any case she will return to see me in September in person.  I did encourage her to obtain a booster for SARS-CoV-2.  Total encounter time 25 minutes.Chauncey Cruel, MD   11/18/2020 9:51 AM Medical Oncology and Hematology [facility] [place], [place] [address] Tel. [phone]    Fax. [phone]   This document serves as a record of services personally performed by Lurline Del, MD. It was created on his behalf by Wilburn Mylar, a trained medical scribe. The creation of this record is based on the scribe's personal observations and the provider's statements to them.    I, Lurline Del MD, have reviewed the above documentation for accuracy and completeness, and I agree with the above.   *Total Encounter Time as defined by the Centers for Medicare and Medicaid Services includes, in addition to the face-to-face time  of a patient visit (documented in the note above) non-face-to-face time: obtaining and reviewing outside history, ordering and reviewing medications, tests or procedures, care coordination (communications with other health care professionals or caregivers) and documentation in the medical record.

## 2020-11-18 ENCOUNTER — Other Ambulatory Visit: Payer: Self-pay

## 2020-11-18 ENCOUNTER — Inpatient Hospital Stay: Payer: BC Managed Care – PPO

## 2020-11-18 ENCOUNTER — Telehealth: Payer: Self-pay | Admitting: Oncology

## 2020-11-18 ENCOUNTER — Inpatient Hospital Stay: Payer: BC Managed Care – PPO | Attending: Oncology | Admitting: Oncology

## 2020-11-18 VITALS — BP 109/79 | HR 96 | Temp 97.5°F | Resp 18 | Ht 65.0 in | Wt 147.6 lb

## 2020-11-18 DIAGNOSIS — R232 Flushing: Secondary | ICD-10-CM | POA: Insufficient documentation

## 2020-11-18 DIAGNOSIS — C50412 Malignant neoplasm of upper-outer quadrant of left female breast: Secondary | ICD-10-CM | POA: Diagnosis not present

## 2020-11-18 DIAGNOSIS — N6489 Other specified disorders of breast: Secondary | ICD-10-CM | POA: Insufficient documentation

## 2020-11-18 DIAGNOSIS — Z170 Estrogen receptor positive status [ER+]: Secondary | ICD-10-CM | POA: Diagnosis not present

## 2020-11-18 DIAGNOSIS — Z90722 Acquired absence of ovaries, bilateral: Secondary | ICD-10-CM | POA: Diagnosis not present

## 2020-11-18 DIAGNOSIS — Z811 Family history of alcohol abuse and dependence: Secondary | ICD-10-CM | POA: Insufficient documentation

## 2020-11-18 DIAGNOSIS — Z8042 Family history of malignant neoplasm of prostate: Secondary | ICD-10-CM | POA: Insufficient documentation

## 2020-11-18 DIAGNOSIS — Z79811 Long term (current) use of aromatase inhibitors: Secondary | ICD-10-CM | POA: Insufficient documentation

## 2020-11-18 DIAGNOSIS — Z79899 Other long term (current) drug therapy: Secondary | ICD-10-CM | POA: Insufficient documentation

## 2020-11-18 DIAGNOSIS — M1990 Unspecified osteoarthritis, unspecified site: Secondary | ICD-10-CM | POA: Diagnosis not present

## 2020-11-18 DIAGNOSIS — Z818 Family history of other mental and behavioral disorders: Secondary | ICD-10-CM | POA: Diagnosis not present

## 2020-11-18 DIAGNOSIS — Z8249 Family history of ischemic heart disease and other diseases of the circulatory system: Secondary | ICD-10-CM | POA: Diagnosis not present

## 2020-11-18 DIAGNOSIS — L989 Disorder of the skin and subcutaneous tissue, unspecified: Secondary | ICD-10-CM | POA: Insufficient documentation

## 2020-11-18 LAB — CBC WITH DIFFERENTIAL/PLATELET
Abs Immature Granulocytes: 0.01 10*3/uL (ref 0.00–0.07)
Basophils Absolute: 0 10*3/uL (ref 0.0–0.1)
Basophils Relative: 1 %
Eosinophils Absolute: 0.1 10*3/uL (ref 0.0–0.5)
Eosinophils Relative: 2 %
HCT: 40.2 % (ref 36.0–46.0)
Hemoglobin: 13.4 g/dL (ref 12.0–15.0)
Immature Granulocytes: 0 %
Lymphocytes Relative: 36 %
Lymphs Abs: 1.6 10*3/uL (ref 0.7–4.0)
MCH: 29.5 pg (ref 26.0–34.0)
MCHC: 33.3 g/dL (ref 30.0–36.0)
MCV: 88.4 fL (ref 80.0–100.0)
Monocytes Absolute: 0.3 10*3/uL (ref 0.1–1.0)
Monocytes Relative: 7 %
Neutro Abs: 2.4 10*3/uL (ref 1.7–7.7)
Neutrophils Relative %: 54 %
Platelets: 219 10*3/uL (ref 150–400)
RBC: 4.55 MIL/uL (ref 3.87–5.11)
RDW: 12.2 % (ref 11.5–15.5)
WBC: 4.4 10*3/uL (ref 4.0–10.5)
nRBC: 0 % (ref 0.0–0.2)

## 2020-11-18 LAB — COMPREHENSIVE METABOLIC PANEL
ALT: 17 U/L (ref 0–44)
AST: 19 U/L (ref 15–41)
Albumin: 4.3 g/dL (ref 3.5–5.0)
Alkaline Phosphatase: 85 U/L (ref 38–126)
Anion gap: 10 (ref 5–15)
BUN: 10 mg/dL (ref 6–20)
CO2: 28 mmol/L (ref 22–32)
Calcium: 10 mg/dL (ref 8.9–10.3)
Chloride: 103 mmol/L (ref 98–111)
Creatinine, Ser: 0.81 mg/dL (ref 0.44–1.00)
GFR, Estimated: >60 mL/min (ref >60)
Glucose, Bld: 91 mg/dL (ref 70–99)
Potassium: 3.5 mmol/L (ref 3.5–5.1)
Sodium: 141 mmol/L (ref 135–145)
Total Bilirubin: 0.7 mg/dL (ref 0.3–1.2)
Total Protein: 7.6 g/dL (ref 6.5–8.1)

## 2020-11-18 MED ORDER — GABAPENTIN 300 MG PO CAPS
300.0000 mg | ORAL_CAPSULE | Freq: Every day | ORAL | 4 refills | Status: DC
Start: 2020-11-18 — End: 2021-07-09

## 2020-11-18 NOTE — Telephone Encounter (Signed)
Scheduled appts per 5/24 sch msg. Pt aware.  

## 2020-11-25 ENCOUNTER — Ambulatory Visit
Admission: RE | Admit: 2020-11-25 | Discharge: 2020-11-25 | Disposition: A | Discharge status: Home / Self Care | Payer: BC Managed Care – PPO | Source: Ambulatory Visit | Attending: Internal Medicine | Admitting: Internal Medicine

## 2020-11-25 ENCOUNTER — Other Ambulatory Visit: Payer: Self-pay | Admitting: Internal Medicine

## 2020-11-25 ENCOUNTER — Other Ambulatory Visit: Payer: Self-pay

## 2020-11-25 DIAGNOSIS — R0602 Shortness of breath: Secondary | ICD-10-CM

## 2020-12-14 ENCOUNTER — Encounter: Payer: Self-pay | Admitting: Hematology and Oncology

## 2020-12-22 ENCOUNTER — Other Ambulatory Visit: Payer: Self-pay

## 2020-12-22 ENCOUNTER — Ambulatory Visit: Payer: BC Managed Care – PPO | Attending: Radiation Oncology

## 2020-12-22 DIAGNOSIS — Z483 Aftercare following surgery for neoplasm: Secondary | ICD-10-CM | POA: Insufficient documentation

## 2020-12-22 NOTE — Therapy (Signed)
[place], [place], [address] Phone: [phone]   Fax:  [phone]  Physical Therapy Treatment  Patient Details  Name: Robin Castillo MRN: [id number] Date of Birth: 10-06-1970 Referring Provider (PT): Dr. Donne Hazel   Encounter Date: 12/22/2020   PT End of Session - 12/22/20 0856     Visit Number 2   # unchanged due to screen only   Number of Visits 2    PT Start Time 0853    PT Stop Time 0905    PT Time Calculation (min) 12 min    Activity Tolerance Patient tolerated treatment well    Behavior During Therapy [facility] for tasks assessed/performed             Past Medical History:  Diagnosis Date   Arthritis    per patient new onset in finger joints   BV (bacterial vaginosis) 2012   Cancer ([place]) 2021   left breast    Cyst of ovary, right 2003   Family history of prostate cancer in father    H/O rubella    H/O varicella as child   History of PCOS 06/2005   Hypertension    Infertility, female    Irregular menses 11/2001   Migraine    Pregnancy induced hypertension    Psoriasis    Psychosocial stressors 10/2009    Past Surgical History:  Procedure Laterality Date   BREAST ENHANCEMENT SURGERY Bilateral 2013   BREAST LUMPECTOMY WITH RADIOACTIVE SEED AND SENTINEL LYMPH NODE BIOPSY Left 08/21/2019   Procedure: LEFT BREAST LUMPECTOMY WITH RADIOACTIVE SEED AND LEFT AXILLARY SENTINEL LYMPH NODE BIOPSY;  Surgeon: [name], MD;  Location: [place];  Service: General;  Laterality: Left;   BREAST SURGERY     CESAREAN SECTION     x 1    RE-EXCISION OF BREAST LUMPECTOMY Left 09/11/2019   Procedure: LEFT RE-EXCISION OF BREAST MARGIN;  Surgeon: [name], MD;  Location: [place];  Service: General;  Laterality: Left;   ROBOTIC ASSISTED BILATERAL SALPINGO OOPHERECTOMY Bilateral 03/20/2020   Procedure: XI ROBOTIC ASSISTED BILATERAL SALPINGO OOPHORECTOMY;   Surgeon: [name], MD;  Location: [place];  Service: Gynecology;  Laterality: Bilateral;  Dr. [name] requesting 2 / 2 1/2 hours    There were no vitals filed for this visit.   Subjective Assessment - 12/22/20 0843     Subjective Pt returns for her 3 month L-Dex screen.    Pertinent History Patient was diagnosed on 07/25/2019 with left grade II invasive ductal carcinoma breast cancer. Patient reports she underwent a left lumpectomy and sentinel node biopsy (1 node removed with micromets) on 08/21/2019. It is ER/PR positive and HER2 negative with a Ki67 of 2%.She had 20 radiation treatments with booster past histroy of breat implants in 2013, possible psoriatic arthritis and possible carpal tunnel on the right                    L-DEX FLOWSHEETS - 12/22/20 0800       L-DEX LYMPHEDEMA SCREENING   Measurement Type Unilateral    L-DEX MEASUREMENT EXTREMITY Upper Extremity    POSITION  Standing    DOMINANT SIDE Right    At Risk Side Left    BASELINE SCORE (UNILATERAL) -0.8    L-DEX SCORE (UNILATERAL) -3.8    VALUE CHANGE (UNILAT) -3  PT Long Term Goals - 08/01/20 1307       PT LONG TERM GOAL #1   Title Patient will be independent with performing self manual techniques and stretching to decrease axillary cording    Time 4    Status New      PT LONG TERM GOAL #2   Title Patient will report >/= 50% less evidence of axiallary cording per her self assessment    Period Weeks    Status New      PT LONG TERM GOAL #3   Title Pt will decreasae Quick DASH score to < 10 demonstrating an improvement in function of her left arm    Baseline 13.64 on 08/01/2020    Time 4    Period Weeks    Status New                   Plan - 12/22/20 6962     Clinical Impression Statement Pt returns for her 3 month L-Dex screen. Her change from baseline of -3 is WNLs so no further treatment is required at  this time except to cont every 3 month L-Dex screens which pt is agreeable to. She reports has developed some 3rd and 4th finger swelling due to arthritis that she is on a round of steroids for so wanted to get a compression glove instead of gauntlet she has been wearing. Issued script for this for class I glove to be worn when she wears her compression sleeve.    PT Next Visit Plan Cont every 3 month L-Dex screens for up to 2 years from her SL[facility].    Consulted and Agree with Plan of Care Patient             Patient will benefit from skilled therapeutic intervention in order to improve the following deficits and impairments:     Visit Diagnosis: Aftercare following surgery for neoplasm     Problem List Patient Active Problem List   Diagnosis Date Noted   B12 deficiency 06/09/2020   Genetic testing 08/10/2019   Family history of prostate cancer in father    Malignant neoplasm of upper-outer quadrant of left breast in female, estrogen receptor positive ([facility]) 07/30/2019    [name], PTA 12/22/2020, 9:10 AM  [place], [place], [address] Phone: [phone]   Fax:  [phone]  Name: Robin Castillo MRN: [id number] Date of Birth: August 17, 1970

## 2020-12-23 ENCOUNTER — Telehealth: Payer: Self-pay | Admitting: Oncology

## 2020-12-23 NOTE — Telephone Encounter (Signed)
Called pt about r/s appt per 6/28 sch msg. Informed pt, currently MD does not have any sooner slots available. I told her I would keep an eye on the schedule for cancellations. She also asked to speak to the nurse so I sent a msg to MD nurse to reach out to pt when she gets a chance.

## 2021-01-04 NOTE — Progress Notes (Addendum)
[place]  [phone] [phone]     ID: Lua Feng DOB: 1970-07-08  MR#: [id number]  BSW#:[id number]  Patient Care Team: [name], MD as PCP - General (Internal Medicine) Rockwell Germany, RN as Oncology Nurse Navigator Mauro Kaufmann, RN as Oncology Nurse Navigator Kyung Rudd, MD as Consulting Physician (Radiation Oncology) [name], MD as Consulting Physician (General Surgery) Aanvi Voyles, Virgie Dad, MD as Consulting Physician (Oncology) Earnstine Regal, PA-C as Consulting Physician (Obstetrics and Gynecology) Belva Crome, MD as Consulting Physician (Cardiology) [name], MD as Consulting Physician (Obstetrics and Gynecology) Charlotte Crumb, MD as Consulting Physician (Orthopedic Surgery) Chauncey Cruel, MD OTHER MD:  I connected with Glyn Ade on 01/05/21 at  8:30 AM EDT by telephone visit and verified that I am speaking with the correct person using two identifiers.   I discussed the limitations, risks, security and privacy concerns of performing an evaluation and management service by telemedicine and the availability of in-person appointments. I also discussed with the patient that there may be a patient responsible charge related to this service. The patient expressed understanding and agreed to proceed.   Other persons participating in the visit and their role in the encounter: None  Patient's location: Home Provider's location: [place]  Total time spent: 25 min   CHIEF COMPLAINT: Invasive ductal breast cancer, estrogen receptor positive  CURRENT TREATMENT: To start exemestane   INTERVAL HISTORY: Belle was contacted today for follow up of her estrogen receptor positive breast cancer.   She has been holding anastrozole due to possible side effects.  She feels "like my normal self", is able to do squats and other exercises she could not do before, and does not have the aches and  pains all over that she was experiencing.  On the other hand her hands are no better.  Her most recent bone density ordered by Dr. Mellody Drown on 05/19/2020 found a T score of -0.1.     REVIEW OF SYSTEMS: Ambermarie tells me her hands have been uncomfortable and having swelling problems for 2 to 3years.  She has been using wrist splints and when she does not use them the hands are actually worse.  She has been working with Dr. Despina Pole on this and he has put her on some steroids which really only slightly helped she says.  He has suggested she either continue nonsteroidals, start [place] Nail, or seek a second opinion.  She is interested in a second opinion and would like [place] to direct her regarding that.  Aside from these issues she is concerned that she has scar tissue around her areola and that worries her--could something be developing there.  Aside from these issues a detailed review of systems today was stable   COVID 19 VACCINATION STATUS: Status post [place] x2 most recently May 2021   HISTORY OF CURRENT ILLNESS: From the original intake note:  Kindell Strada presented with a palpable left breast lump. She underwent bilateral diagnostic mammography with tomography and left breast ultrasonography at [facility] on 07/25/2019 showing: breast density category B; mass with associated clustered calcifications (overall dimension up to 1.2 cm on mammogram and 0.9 cm on ultrasound) within upper-outer left breast at 1 o'clock; left axillary lymph node appears within normal limits.  Accordingly on 07/26/2019 she proceeded to biopsy of the left breast area in question. The pathology from this procedure ([id number]) showed: invasive ductal carcinoma, grade 2. Prognostic indicators significant for: estrogen receptor, 70% positive with  moderate staining intensity and progesterone receptor, 95% positive with strong staining intensity. Proliferation marker Ki67 at 2%. HER2 negative by immunohistochemistry (1+).  The  patient's subsequent history is as detailed below.   PAST MEDICAL HISTORY: Past Medical History:  Diagnosis Date   Arthritis    per patient new onset in finger joints   BV (bacterial vaginosis) 2012   Cancer ([place]) 2021   left breast    Cyst of ovary, right 2003   Family history of prostate cancer in father    H/O rubella    H/O varicella as child   History of PCOS 06/2005   Hypertension    Infertility, female    Irregular menses 11/2001   Migraine    Pregnancy induced hypertension    Psoriasis    Psychosocial stressors 10/2009    PAST SURGICAL HISTORY: Past Surgical History:  Procedure Laterality Date   BREAST ENHANCEMENT SURGERY Bilateral 2013   BREAST LUMPECTOMY WITH RADIOACTIVE SEED AND SENTINEL LYMPH NODE BIOPSY Left 08/21/2019   Procedure: LEFT BREAST LUMPECTOMY WITH RADIOACTIVE SEED AND LEFT AXILLARY SENTINEL LYMPH NODE BIOPSY;  Surgeon: [name], MD;  Location: [facility];  Service: General;  Laterality: Left;   BREAST SURGERY     CESAREAN SECTION     x 1    RE-EXCISION OF BREAST LUMPECTOMY Left 09/11/2019   Procedure: LEFT RE-EXCISION OF BREAST MARGIN;  Surgeon: [name], MD;  Location: [place];  Service: General;  Laterality: Left;   ROBOTIC ASSISTED BILATERAL SALPINGO OOPHERECTOMY Bilateral 03/20/2020   Procedure: XI ROBOTIC ASSISTED BILATERAL SALPINGO OOPHORECTOMY;  Surgeon: [name], MD;  Location: [facility];  Service: Gynecology;  Laterality: Bilateral;  Dr. [name] requesting 2 / 2 1/2 hours    FAMILY HISTORY: Family History  Problem Relation Age of Onset   Heart disease Mother    Hypertension Mother    Heart disease Father    Depression Father    Alcohol abuse Father    Prostate cancer Father 36   Heart disease Maternal Grandmother    Heart disease Maternal Grandfather   Patient's father is 49 years old and patient's mother 64 years old (as of 07/2019) The patient denies a family hx  of breast or ovarian cancer. She does report her father was recently diagnosed with prostate cancer. She has 1 sister.   GYNECOLOGIC HISTORY:  No LMP recorded. (Menstrual status: Other). Menarche: 50 years old Age at first live birth: 50 years old [place] P 2 LMP 07/28/2019, periods are irregular Contraceptive: yes, used for more than 20 years HRT n/a  Hysterectomy? no BSO? yes   SOCIAL HISTORY: (updated 11/2019)  Janeann is of [place] extraction.  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 Husband [name] is a self-employed Forensic psychologist. She lives at home with her husband and two sons: [name]  14 and [name]. She attends a local [employer]   ADVANCED DIRECTIVES: In the absence of any documentation to the contrary, the patient's spouse is their HCPOA.    HEALTH MAINTENANCE: Social History   Tobacco Use   Smoking status: Never   Smokeless tobacco: Never  Vaping Use   Vaping Use: Never used  Substance Use Topics   Alcohol use: Not Currently   Drug use: No     Colonoscopy: n/a (age)  PAP: 06/2018  Bone density: n/a (age)   No Known Allergies  Current Outpatient  Medications  Medication Sig Dispense Refill   anastrozole (ARIMIDEX) 1 MG tablet Take 1 tablet (1 mg total) by mouth daily. 90 tablet 4   Azilsartan-Chlorthalidone (EDARBYCLOR) 40-12.5 MG TABS Take by mouth. 30 tablet    fluocinolone (VANOS) 0.01 % cream Apply topically as needed.     gabapentin (NEURONTIN) 300 MG capsule Take 1 capsule (300 mg total) by mouth at bedtime. 90 capsule 4   No current facility-administered medications for this visit.    OBJECTIVE: [place] woman who appears stated age  There were no vitals filed for this visit.    There is no height or weight on file to calculate BMI.   Wt Readings from Last 3 Encounters:  11/18/20 147 lb 9.6 oz (67 kg)  06/09/20 145 lb (65.8 kg)  03/20/20 142 lb (64.4 kg)      ECOG FS:1 - Symptomatic  but completely ambulatory  Telemedicine visit 01/05/2021  LAB RESULTS:  CMP     Component Value Date/Time   NA 141 11/18/2020 0838   K 3.5 11/18/2020 0838   CL 103 11/18/2020 0838   CO2 28 11/18/2020 0838   GLUCOSE 91 11/18/2020 0838   BUN 10 11/18/2020 0838   CREATININE 0.81 11/18/2020 0838   CREATININE 0.77 08/01/2019 1234   CALCIUM 10.0 11/18/2020 0838   PROT 7.6 11/18/2020 0838   ALBUMIN 4.3 11/18/2020 0838   AST 19 11/18/2020 0838   AST 15 08/01/2019 1234   ALT 17 11/18/2020 0838   ALT 13 08/01/2019 1234   ALKPHOS 85 11/18/2020 0838   BILITOT 0.7 11/18/2020 0838   BILITOT 0.3 08/01/2019 1234   GFRNONAA >60 11/18/2020 0838   GFRNONAA >60 08/01/2019 1234   GFRAA >60 03/14/2020 1053   GFRAA >60 08/01/2019 1234    No results found for: TOTALPROTELP, ALBUMINELP, A1GS, A2GS, BETS, BETA2SER, GAMS, MSPIKE, SPEI  Lab Results  Component Value Date   WBC 4.4 11/18/2020   NEUTROABS 2.4 11/18/2020   HGB 13.4 11/18/2020   HCT 40.2 11/18/2020   MCV 88.4 11/18/2020   PLT 219 11/18/2020    No results found for: LABCA2  No components found for: [id number]  No results for input(s): INR in the last 168 hours.  No results found for: LABCA2  No results found for: WUJ811  No results found for: BJY782  No results found for: NFA213  No results found for: CA2729  No components found for: HGQUANT  No results found for: CEA1 / No results found for: CEA1   No results found for: AFPTUMOR  No results found for: CHROMOGRNA  No results found for: KPAFRELGTCHN, LAMBDASER, KAPLAMBRATIO (kappa/lambda light chains)  No results found for: HGBA, HGBA2QUANT, HGBFQUANT, HGBSQUAN (Hemoglobinopathy evaluation)   No results found for: LDH  No results found for: IRON, TIBC, IRONPCTSAT (Iron and TIBC)  No results found for: FERRITIN  Urinalysis No results found for: COLORURINE, APPEARANCEUR, LABSPEC, PHURINE, GLUCOSEU, HGBUR, BILIRUBINUR, KETONESUR, PROTEINUR, UROBILINOGEN,  NITRITE, LEUKOCYTESUR   STUDIES: No results found.   ELIGIBLE FOR AVAILABLE RESEARCH PROTOCOL: no  ASSESSMENT: 50 y.o. [place] woman status post left breast upper outer quadrant biopsy 07/26/2019 for a clinical T1b-c N0, stage Ia invasive ductal carcinoma, grade 2, estrogen and progesterone receptor positive, HER-2 not amplified, with an MIB-1 of 2  (1) status post left lumpectomy and sentinel lymph node sampling 08/21/2019 for a pT1c pN1(mic), 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  (a) a single sentinel lymph node was removed  (b)  additional surgery 09/11/2019 cleared the margins  (2) Oncotype score of 16 predicts a risk of recurrence outside the breast in the next 9 years of 15% if the patient's only systemic therapy is tamoxifen for 5 years.  It also predicts no apparent benefit from chemotherapy.  (3) adjuvant radiation 10/17/2019 through 11/13/2019 Site/dose:   The patient initially received a dose of 42.56 Gy in 16 fractions to the breast using whole-breast tangent fields. This was delivered using a 3-D conformal technique. The patient then received a boost to the seroma. This delivered an additional 10 Gy in 48fractions using an en face electron field due to the depth of the seroma. The total dose was 52.56Gy.  (4) antiestrogens for a minimum of 5 years:  (a) goserelin/Zoladex started 10/12/2019, discontinued after 02/29/2020 dose  (b) anastrozole started 11/27/2019, discontinued May 2022 with multiple side effects  (c) bilateral salpingo-oophorectomy 03/20/2020  (d) bone density 05/19/2020 shows a T score of -0.1 (normal).  (e) exemestane started July 2022  (5) genetics testing 08/09/2019 through the [facility] Breast Cancer STAT and Common Hereditary Cancers panels found no deleterious mutations in ATM, BRCA1, BRCA2, CDH1, CHEK2, PALB2, PTEN, STK11 and TP53.  APC, ATM, AXIN2, BARD1, BMPR1A, BRCA1, BRCA2, BRIP1, CDH1, CDK4, CDKN2A  (p14ARF), CDKN2A (p16INK4a), CHEK2, CTNNA1, DICER1, EPCAM (Deletion/duplication testing only), GREM1 (promoter region deletion/duplication testing only), KIT, MEN1, MLH1, MSH2, MSH3, MSH6, MUTYH, NBN, NF1, NHTL1, PALB2, PDGFRA, PMS2, POLD1, POLE, PTEN, RAD50, RAD51C, RAD51D, RNF43, SDHB, SDHC, SDHD, SMAD4, SMARCA4. STK11, TP53, TSC1, TSC2, and VHL.  The following genes were evaluated for sequence changes only: SDHA and HOXB13 c.251G>A variant only.  (a) A variant of uncertain significance was detected in the MSH3 gene called c.803G>A (p.Arg268Gln). The report date is 08/09/2019.   PLAN: Elenore went off the anastrozole about 6 weeks ago and feels considerably better.  This is good evidence I think that she would not be able to tolerate that medication long-term.  The question is which way to proceed.  We discussed exemestane today.  She has a good understanding of the possible toxicities side effects and complications which she has read deeply about and I have put in the order for her.  Of the exemestane proved proved to be prohibitively expensive we will switch to letrozole.  Quite aside from that she continues to have problems with her hands.  I do not know if this is carpal tunnel, if this is part of psoriatic arthritis or if it is unrelated.  She would like a second opinion.  I do not know a particular rheumatologist at a tertiary care center that I refer to but I will ask one of the rheumatologist friends to see if they have suggestions.  Otherwise I will check in with Chante again in about 4 weeks.  We can do it again virtually.  If she cannot afford and tolerates the exemestane well we will simply continue that.  If not we will consider other options.  Total encounter time 25 minutes.   Chauncey Cruel, MD   01/05/2021 8:12 AM Medical Oncology and Hematology [facility] [place], [place] [address] Tel. [phone]    Fax. [phone]  Addendum: I asked a  rheumatologist friend to suggest someone Angalina could see for second opinion.  He suggested Berenda Morale at [facility], [phone].  I gave LN the information on a voicemail as she did not answer her phone.  I placed an electronic referral.   This document serves as  a record of services personally performed by Lurline Del, MD. It was created on his behalf by Wilburn Mylar, a trained medical scribe. The creation of this record is based on the scribe's personal observations and the provider's statements to them.   I, Lurline Del MD, have reviewed the above documentation for accuracy and completeness, and I agree with the above.   *Total Encounter Time as defined by the Centers for Medicare and Medicaid Services includes, in addition to the face-to-face time of a patient visit (documented in the note above) non-face-to-face time: obtaining and reviewing outside history, ordering and reviewing medications, tests or procedures, care coordination (communications with other health care professionals or caregivers) and documentation in the medical record.

## 2021-01-05 ENCOUNTER — Encounter: Payer: Self-pay | Admitting: Oncology

## 2021-01-05 ENCOUNTER — Other Ambulatory Visit: Payer: Self-pay

## 2021-01-05 ENCOUNTER — Inpatient Hospital Stay: Payer: BC Managed Care – PPO | Attending: Oncology | Admitting: Oncology

## 2021-01-05 DIAGNOSIS — C50412 Malignant neoplasm of upper-outer quadrant of left female breast: Secondary | ICD-10-CM

## 2021-01-05 DIAGNOSIS — Z170 Estrogen receptor positive status [ER+]: Secondary | ICD-10-CM | POA: Diagnosis not present

## 2021-01-05 MED ORDER — EXEMESTANE 25 MG PO TABS: 25.0000 mg | ORAL_TABLET | Freq: Every day | ORAL | 4 refills | Status: DC

## 2021-01-06 NOTE — Addendum Note (Signed)
Addended by: Chauncey Cruel on: 01/06/2021 02:16 AM   Modules accepted: Orders

## 2021-01-07 ENCOUNTER — Telehealth: Payer: Self-pay | Admitting: Oncology

## 2021-01-07 NOTE — Telephone Encounter (Signed)
Scheduled per 7/11 los. Called and spoke with pt confirmed 8/10 appt

## 2021-02-03 NOTE — Progress Notes (Signed)
[place]  Telephone:([phone] Fax:([phone]) [phone]     ID: Robin Castillo DOB: May 03, 1971  MR#: [id number]  NOI#:[id number]  Patient Care Team: [name], MD as PCP - General (Internal Medicine) Rockwell Germany, RN as Oncology Nurse Navigator Mauro Kaufmann, RN as Oncology Nurse Navigator Kyung Rudd, MD as Consulting Physician (Radiation Oncology) [name], MD as Consulting Physician (General Surgery) Calistro Rauf, Virgie Dad, MD as Consulting Physician (Oncology) Earnstine Regal, PA-C as Consulting Physician (Obstetrics and Gynecology) Belva Crome, MD as Consulting Physician (Cardiology) [name], MD as Consulting Physician (Obstetrics and Gynecology) Charlotte Crumb, MD as Consulting Physician (Orthopedic Surgery) Berenda Morale, MD as Referring Physician (Internal Medicine) Chauncey Cruel, MD OTHER MD:  I connected with Glyn Ade on 02/04/21 at 12:00 PM EDT by telephone visit and verified that I am speaking with the correct person using two identifiers.   I discussed the limitations, risks, security and privacy concerns of performing an evaluation and management service by telemedicine and the availability of in-person appointments. I also discussed with the patient that there may be a patient responsible charge related to this service. The patient expressed understanding and agreed to proceed.   Other persons participating in the visit and their role in the encounter: None  Patient's location: Home Provider's location: [place]  Total time spent: 25 min   CHIEF COMPLAINT: Invasive ductal breast cancer, estrogen receptor positive  CURRENT TREATMENT: To start exemestane   INTERVAL HISTORY: Robin Castillo was contacted today for follow up of her estrogen receptor positive breast cancer.   She was switched to exemestane at her last visit on 01/05/2021.  She is tolerating it generally well.  Recall it took several months before  she started having increased symptoms possibly related to the anastrozole so is still early days.  Nevertheless she is clearly having fewer hot flashes.  She is having more of the carpal tunnel syndrome symptoms.  She makes a distinction between tenderness pain and stiffness and what she feels is more discomfort and stiffness.  She is obtaining the exemestane at no cost  Since her last visit here she established herself with Dr. Berenda Morale at [facility] who feels primarily Robin Castillo is dealing with osteoarthritis.  She started her on Mobic and that seems to be helping.  She has other follow-up visits there with work-up in progress.    Her most recent bone density ordered by Dr. Mellody Drown on 05/19/2020 found a T score of -0.1.     REVIEW OF SYSTEMS: Robin Castillo otherwise is doing "fantastic", exercising even more than before, and bring in her diet even more plant-based than before.  A detailed review of systems was otherwise stable.   COVID 19 VACCINATION STATUS: Status post Pfizer x2 most recently May 2021   HISTORY OF CURRENT ILLNESS: From the original intake note:  Robin Castillo presented with a palpable left breast lump. She underwent bilateral diagnostic mammography with tomography and left breast ultrasonography at [facility] on 07/25/2019 showing: breast density category B; mass with associated clustered calcifications (overall dimension up to 1.2 cm on mammogram and 0.9 cm on ultrasound) within upper-outer left breast at 1 o'clock; left axillary lymph node appears within normal limits.  Accordingly on 07/26/2019 she proceeded to biopsy of the left breast area in question. The pathology from this procedure ([id number]) showed: invasive ductal carcinoma, grade 2. Prognostic indicators significant for: estrogen receptor, 70% positive with moderate staining intensity and progesterone receptor, 95% positive with strong staining  intensity. Proliferation marker Ki67 at 2%. HER2 negative by  immunohistochemistry (1+).  The patient's subsequent history is as detailed below.   PAST MEDICAL HISTORY: Past Medical History:  Diagnosis Date   Arthritis    per patient new onset in finger joints   BV (bacterial vaginosis) 2012   Cancer ([place]) 2021   left breast    Cyst of ovary, right 2003   Family history of prostate cancer in father    H/O rubella    H/O varicella as child   History of PCOS 06/2005   Hypertension    Infertility, female    Irregular menses 11/2001   Migraine    Pregnancy induced hypertension    Psoriasis    Psychosocial stressors 10/2009    PAST SURGICAL HISTORY: Past Surgical History:  Procedure Laterality Date   BREAST ENHANCEMENT SURGERY Bilateral 2013   BREAST LUMPECTOMY WITH RADIOACTIVE SEED AND SENTINEL LYMPH NODE BIOPSY Left 08/21/2019   Procedure: LEFT BREAST LUMPECTOMY WITH RADIOACTIVE SEED AND LEFT AXILLARY SENTINEL LYMPH NODE BIOPSY;  Surgeon: [name], MD;  Location: [place];  Service: General;  Laterality: Left;   BREAST SURGERY     CESAREAN SECTION     x 1    RE-EXCISION OF BREAST LUMPECTOMY Left 09/11/2019   Procedure: LEFT RE-EXCISION OF BREAST MARGIN;  Surgeon: [name], MD;  Location: [place];  Service: General;  Laterality: Left;   ROBOTIC ASSISTED BILATERAL SALPINGO OOPHERECTOMY Bilateral 03/20/2020   Procedure: XI ROBOTIC ASSISTED BILATERAL SALPINGO OOPHORECTOMY;  Surgeon: [name], MD;  Location: [place];  Service: Gynecology;  Laterality: Bilateral;  Dr. [name] requesting 2 / 2 1/2 hours    FAMILY HISTORY: Family History  Problem Relation Age of Onset   Heart disease Mother    Hypertension Mother    Heart disease Father    Depression Father    Alcohol abuse Father    Prostate cancer Father 37   Heart disease Maternal Grandmother    Heart disease Maternal Grandfather   Patient's father is 35 years old and patient's mother 53 years old (as of  07/2019) The patient denies a family hx of breast or ovarian cancer. She does report her father was recently diagnosed with prostate cancer. She has 1 sister.   GYNECOLOGIC HISTORY:  No LMP recorded. (Menstrual status: Other). Menarche: 50 years old Age at first live birth: 50 years old [place] P 2 LMP 07/28/2019, periods are irregular Contraceptive: yes, used for more than 20 years HRT n/a  Hysterectomy? no BSO? yes   SOCIAL HISTORY: (updated 11/2019)  Robin Castillo is of [place] extraction.  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 Husband Robin Castillo is a self-employed Forensic psychologist. She lives at home with her husband and two sons: Robin Castillo  14 and Robin Castillo. She attends a local [employer]   ADVANCED DIRECTIVES: In the absence of any documentation to the contrary, the patient's spouse is their HCPOA.    HEALTH MAINTENANCE: Social History   Tobacco Use   Smoking status: Never   Smokeless tobacco: Never  Vaping Use   Vaping Use: Never used  Substance Use Topics   Alcohol use: Not Currently   Drug use: No     Colonoscopy: n/a (age)  PAP: 06/2018  Bone density: n/a (age)   No Known Allergies  Current Outpatient Medications  Medication Sig Dispense Refill   ergocalciferol (VITAMIN D2)  1.25 MG (50000 UT) capsule Take 1 capsule (50,000 Units total) by mouth once a week.     Azilsartan-Chlorthalidone (EDARBYCLOR) 40-12.5 MG TABS Take by mouth. 30 tablet    cholecalciferol (VITAMIN D3) 25 MCG (1000 UNIT) tablet Take 1 tablet (1,000 Units total) by mouth daily.     co-enzyme Q-10 30 MG capsule Take 1 capsule (30 mg total) by mouth 3 (three) times daily.     exemestane (AROMASIN) 25 MG tablet Take 1 tablet (25 mg total) by mouth daily after breakfast. 90 tablet 4   fluocinolone (VANOS) 0.01 % cream Apply topically as needed.     gabapentin (NEURONTIN) 300 MG capsule Take 1 capsule (300 mg total) by mouth at bedtime. 90 capsule 4    Turmeric 500 MG TABS Take by mouth.     No current facility-administered medications for this visit.    OBJECTIVE: [place] woman who appears stated age  There were no vitals filed for this visit.    There is no height or weight on file to calculate BMI.   Wt Readings from Last 3 Encounters:  11/18/20 147 lb 9.6 oz (67 kg)  06/09/20 145 lb (65.8 kg)  03/20/20 142 lb (64.4 kg)      ECOG FS:1 - Symptomatic but completely ambulatory  Telemedicine visit 02/04/2021  LAB RESULTS:  CMP     Component Value Date/Time   NA 141 11/18/2020 0838   K 3.5 11/18/2020 0838   CL 103 11/18/2020 0838   CO2 28 11/18/2020 0838   GLUCOSE 91 11/18/2020 0838   BUN 10 11/18/2020 0838   CREATININE 0.81 11/18/2020 0838   CREATININE 0.77 08/01/2019 1234   CALCIUM 10.0 11/18/2020 0838   PROT 7.6 11/18/2020 0838   ALBUMIN 4.3 11/18/2020 0838   AST 19 11/18/2020 0838   AST 15 08/01/2019 1234   ALT 17 11/18/2020 0838   ALT 13 08/01/2019 1234   ALKPHOS 85 11/18/2020 0838   BILITOT 0.7 11/18/2020 0838   BILITOT 0.3 08/01/2019 1234   GFRNONAA >60 11/18/2020 0838   GFRNONAA >60 08/01/2019 1234   GFRAA >60 03/14/2020 1053   GFRAA >60 08/01/2019 1234    No results found for: TOTALPROTELP, ALBUMINELP, A1GS, A2GS, BETS, BETA2SER, GAMS, MSPIKE, SPEI  Lab Results  Component Value Date   WBC 4.4 11/18/2020   NEUTROABS 2.4 11/18/2020   HGB 13.4 11/18/2020   HCT 40.2 11/18/2020   MCV 88.4 11/18/2020   PLT 219 11/18/2020    No results found for: LABCA2  No components found for: OVZCHY850  No results for input(s): INR in the last 168 hours.  No results found for: LABCA2  No results found for: YDX412  No results found for: INO676  No results found for: HMC947  No results found for: CA2729  No components found for: HGQUANT  No results found for: CEA1 / No results found for: CEA1   No results found for: AFPTUMOR  No results found for: CHROMOGRNA  No results found for: KPAFRELGTCHN,  LAMBDASER, KAPLAMBRATIO (kappa/lambda light chains)  No results found for: HGBA, HGBA2QUANT, HGBFQUANT, HGBSQUAN (Hemoglobinopathy evaluation)   No results found for: LDH  No results found for: IRON, TIBC, IRONPCTSAT (Iron and TIBC)  No results found for: FERRITIN  Urinalysis No results found for: COLORURINE, APPEARANCEUR, LABSPEC, PHURINE, GLUCOSEU, HGBUR, BILIRUBINUR, KETONESUR, PROTEINUR, UROBILINOGEN, NITRITE, LEUKOCYTESUR   STUDIES: No results found.   ELIGIBLE FOR AVAILABLE RESEARCH PROTOCOL: no  ASSESSMENT: 50 y.o. [place] woman status post left breast upper outer  quadrant biopsy 07/26/2019 for a clinical T1b-c N0, stage Ia invasive ductal carcinoma, grade 2, estrogen and progesterone receptor positive, HER-2 not amplified, with an MIB-1 of 2  (1) status post left lumpectomy and sentinel lymph node sampling 08/21/2019 for a pT1c pN1(mic), 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  (a) a single sentinel lymph node was removed  (b) additional surgery 09/11/2019 cleared the margins  (2) Oncotype score of 16 predicts a risk of recurrence outside the breast in the next 9 years of 15% if the patient's only systemic therapy is tamoxifen for 5 years.  It also predicts no apparent benefit from chemotherapy.  (3) adjuvant radiation 10/17/2019 through 11/13/2019 Site/dose:   The patient initially received a dose of 42.56 Gy in 16 fractions to the breast using whole-breast tangent fields. This was delivered using a 3-D conformal technique. The patient then received a boost to the seroma. This delivered an additional 10 Gy in 34fractions using an en face electron field due to the depth of the seroma. The total dose was 52.56Gy.  (4) antiestrogens for a minimum of 5 years:  (a) goserelin/Zoladex started 10/12/2019, discontinued after 02/29/2020 dose  (b) anastrozole started 11/27/2019, discontinued May 2022 with multiple side effects  (c)  bilateral salpingo-oophorectomy 03/20/2020  (d) bone density 05/19/2020 shows a T score of -0.1 (normal).  (e) exemestane started July 2022  (5) genetics testing 08/09/2019 through the [facility] Breast Cancer STAT and Common Hereditary Cancers panels found no deleterious mutations in ATM, BRCA1, BRCA2, CDH1, CHEK2, PALB2, PTEN, STK11 and TP53.  APC, ATM, AXIN2, BARD1, BMPR1A, BRCA1, BRCA2, BRIP1, CDH1, CDK4, CDKN2A (p14ARF), CDKN2A (p16INK4a), CHEK2, CTNNA1, DICER1, EPCAM (Deletion/duplication testing only), GREM1 (promoter region deletion/duplication testing only), KIT, MEN1, MLH1, MSH2, MSH3, MSH6, MUTYH, NBN, NF1, NHTL1, PALB2, PDGFRA, PMS2, POLD1, POLE, PTEN, RAD50, RAD51C, RAD51D, RNF43, SDHB, SDHC, SDHD, SMAD4, SMARCA4. STK11, TP53, TSC1, TSC2, and VHL.  The following genes were evaluated for sequence changes only: SDHA and HOXB13 c.251G>A variant only.  (a) A variant of uncertain significance was detected in the MSH3 gene called c.803G>A (p.Arg268Gln). The report date is 08/09/2019.   PLAN: Robin Castillo seems to be tolerating the exemestane better than the anastrozole but it may be a while before we really know that.  At the very least it is great that she is obtaining it at no cost.  Also as noted above she is having fewer hot flashes which is also a positive.  She is very hopeful working with Dr. Fredonia Highland in [place] that she will get "definitive answers" and also improved symptomatology.  The plan at this point plan is to continue exemestane until she completes her 5 years of antiestrogen therapy.  She is going to see me again at the end of September.  She knows to call for any other issue that may develop before then   Chauncey Cruel, MD   02/04/2021 12:15 PM Medical Oncology and Hematology [facility] [place], [place] [address] Tel. [phone]    Fax. [phone]   This document serves as a record of services personally performed by Lurline Del, MD. It  was created on his behalf by Wilburn Mylar, a trained medical scribe. The creation of this record is based on the scribe's personal observations and the provider's statements to them.   I, Lurline Del MD, have reviewed the above documentation for accuracy and completeness, and I agree with the above.   *Total Encounter Time  as defined by the Centers for Medicare and Medicaid Services includes, in addition to the face-to-face time of a patient visit (documented in the note above) non-face-to-face time: obtaining and reviewing outside history, ordering and reviewing medications, tests or procedures, care coordination (communications with other health care professionals or caregivers) and documentation in the medical record.

## 2021-02-04 ENCOUNTER — Telehealth: Payer: BC Managed Care – PPO | Admitting: Oncology

## 2021-02-04 ENCOUNTER — Inpatient Hospital Stay: Payer: BC Managed Care – PPO | Attending: Oncology | Admitting: Oncology

## 2021-02-04 DIAGNOSIS — C50412 Malignant neoplasm of upper-outer quadrant of left female breast: Secondary | ICD-10-CM | POA: Insufficient documentation

## 2021-02-04 DIAGNOSIS — Z170 Estrogen receptor positive status [ER+]: Secondary | ICD-10-CM | POA: Diagnosis not present

## 2021-03-16 ENCOUNTER — Encounter: Payer: Self-pay | Admitting: Hematology and Oncology

## 2021-03-17 ENCOUNTER — Encounter: Payer: Self-pay | Admitting: Hematology and Oncology

## 2021-03-23 ENCOUNTER — Other Ambulatory Visit: Payer: Self-pay

## 2021-03-23 ENCOUNTER — Ambulatory Visit: Payer: BC Managed Care – PPO | Attending: Radiation Oncology

## 2021-03-23 VITALS — Wt 145.0 lb

## 2021-03-23 DIAGNOSIS — Z483 Aftercare following surgery for neoplasm: Secondary | ICD-10-CM | POA: Insufficient documentation

## 2021-03-23 NOTE — Progress Notes (Signed)
Robin Castillo  [phone] [phone]     ID: Robin Castillo DOB: 01-11-1971  MR#: [id number]  HBZ#:[id number]  Patient Care Team: [name], MD as PCP - General (Internal Medicine) Rockwell Germany, RN as Oncology Nurse Navigator Mauro Kaufmann, RN as Oncology Nurse Navigator Kyung Rudd, MD as Consulting Physician (Radiation Oncology) [name], MD as Consulting Physician (General Surgery) Darryll Raju, Virgie Dad, MD as Consulting Physician (Oncology) Belva Crome, MD as Consulting Physician (Cardiology) Charlotte Crumb, MD as Consulting Physician (Orthopedic Surgery) Chauncey Cruel, MD OTHER MD:   CHIEF COMPLAINT: Invasive ductal breast cancer, estrogen receptor positive  CURRENT TREATMENT: exemestane   INTERVAL HISTORY: [name] returns today for follow up of her estrogen receptor positive breast cancer.   She was switched to exemestane on 01/05/2021.  She is tolerating it generally well.  Recall it took several months before she started having increased symptoms possibly related to the anastrozole so is still early days.  Nevertheless she is clearly having fewer hot flashes.  She is having more of the carpal tunnel syndrome symptoms.  She makes a distinction between tenderness pain and stiffness and what she feels is more discomfort and stiffness.  She is obtaining the exemestane at no cost  Her most recent bone density ordered by Dr. Mellody Drown on 05/19/2020 found a T score of -0.1.    Since her last visit here she was evaluated by Dr. Regenia Skeeter at [facility] 02/03/2021 and I am copying her useful summary here:  Robin Castillo is a 50 year old woman with a 4 year history of initially intermittent and now more persistent hand swelling and discomfort. She has night time and early AM stiffness which is relieved in about 5 minutes. On examination today she has some mild diffuse swelling, one or two slightly tender joints. No definite synovitis.  Her labs are reassuring. There has been some concern for psoriatic disease but my index of suspicion is very low. This could be early seronegative RA but after 4 years of symptoms I would expect to see more findings on exam or radiographs. The introduction of an aromatase inhibitor is interesting but she is very clear that the hand symptoms predated the AI and have not resolved with discontinuation.  I suspect that she has early OA, perhaps with some mild inflammatory features. I have a low index of concern for underlying autoimmunity but note that she has not had an ANA checked so I will do that today. Since she had an elevated Ca at one point, will recheck that and get a PTH for completeness.   I have recommended a trial of Meloxicam 15 mg daily with the first meal of the day. We briefly discussed a trial of hydroxychloroquine if regular NSAIDs are not helpful.  All questions were addressed. Followup is recommended in 3 months.    REVIEW OF SYSTEMS: [name] found the meloxicam at 15 mg increased her blood pressure.  She cut the dose in half and that helped.  However she really is not satisfied (Dr. Fredonia Highland only has a 1 day clinic and is not as available as Marleah would like).  She is going to try to establish herself with Dr. Estanislado Pandy here in town.  Recall she did work with Dr. Amil Amen previously as well.  At this point [name] feels that she probably does have early psoriatic arthritis and once she gets that diagnosis established if that is what it is then it can be treated and  that is what she is working towards.   COVID 19 VACCINATION STATUS: Status post Pfizer x2 as of September 2022  HISTORY OF CURRENT ILLNESS: From the original intake note:  Robin Castillo presented with a palpable left breast lump. She underwent bilateral diagnostic mammography with tomography and left breast ultrasonography at [facility] on 07/25/2019 showing: breast density category B; mass with associated clustered  calcifications (overall dimension up to 1.2 cm on mammogram and 0.9 cm on ultrasound) within upper-outer left breast at 1 o'clock; left axillary lymph node appears within normal limits.  Accordingly on 07/26/2019 she proceeded to biopsy of the left breast area in question. The pathology from this procedure ([id number]) showed: invasive ductal carcinoma, grade 2. Prognostic indicators significant for: estrogen receptor, 70% positive with moderate staining intensity and progesterone receptor, 95% positive with strong staining intensity. Proliferation marker Ki67 at 2%. HER2 negative by immunohistochemistry (1+).  The patient's subsequent history is as detailed below.   PAST MEDICAL HISTORY: Past Medical History:  Diagnosis Date   Arthritis    per patient new onset in finger joints   BV (bacterial vaginosis) 2012   Cancer ([place]) 2021   left breast    Cyst of ovary, right 2003   Family history of prostate cancer in father    H/O rubella    H/O varicella as child   History of PCOS 06/2005   Hypertension    Infertility, female    Irregular menses 11/2001   Migraine    Pregnancy induced hypertension    Psoriasis    Psychosocial stressors 10/2009    PAST SURGICAL HISTORY: Past Surgical History:  Procedure Laterality Date   BREAST ENHANCEMENT SURGERY Bilateral 2013   BREAST LUMPECTOMY WITH RADIOACTIVE SEED AND SENTINEL LYMPH NODE BIOPSY Left 08/21/2019   Procedure: LEFT BREAST LUMPECTOMY WITH RADIOACTIVE SEED AND LEFT AXILLARY SENTINEL LYMPH NODE BIOPSY;  Surgeon: [name], MD;  Location: [place];  Service: General;  Laterality: Left;   BREAST SURGERY     CESAREAN SECTION     x 1    RE-EXCISION OF BREAST LUMPECTOMY Left 09/11/2019   Procedure: LEFT RE-EXCISION OF BREAST MARGIN;  Surgeon: [name], MD;  Location: [place];  Service: General;  Laterality: Left;   ROBOTIC ASSISTED BILATERAL SALPINGO OOPHERECTOMY Bilateral 03/20/2020    Procedure: XI ROBOTIC ASSISTED BILATERAL SALPINGO OOPHORECTOMY;  Surgeon: [name], MD;  Location: [place];  Service: Gynecology;  Laterality: Bilateral;  Dr. [name] requesting 2 / 2 1/2 hours    FAMILY HISTORY: Family History  Problem Relation Age of Onset   Heart disease Mother    Hypertension Mother    Heart disease Father    Depression Father    Alcohol abuse Father    Prostate cancer Father 30   Heart disease Maternal Grandmother    Heart disease Maternal Grandfather   Patient's father is 5 years old and patient's mother 47 years old (as of 07/2019) The patient denies a family hx of breast or ovarian cancer. She does report her father was recently diagnosed with prostate cancer. She has 1 sister.   GYNECOLOGIC HISTORY:  No LMP recorded. (Menstrual status: Other). Menarche: 50 years old Age at first live birth: 50 years old [place] P 2 LMP 07/28/2019, periods are irregular Contraceptive: yes, used for more than 20 years HRT n/a  Hysterectomy? no BSO? yes   SOCIAL HISTORY: (updated 11/2019)  Randye is of [place] extraction.  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 Husband [name] is a self-employed Forensic psychologist. She lives at home with her husband and two sons: [name]  14 and [name]. She attends a local [employer]   ADVANCED DIRECTIVES: In the absence of any documentation to the contrary, the patient's spouse is their HCPOA.    HEALTH MAINTENANCE: Social History   Tobacco Use   Smoking status: Never   Smokeless tobacco: Never  Vaping Use   Vaping Use: Never used  Substance Use Topics   Alcohol use: Not Currently   Drug use: No     Colonoscopy: n/a (age)  PAP: 06/2018  Bone density: n/a (age)   No Known Allergies  Current Outpatient Medications  Medication Sig Dispense Refill   lisinopril-hydrochlorothiazide (ZESTORETIC) 10-12.5 MG tablet Take 1 tablet by mouth daily.      vitamin B-12 (CYANOCOBALAMIN) 100 MCG tablet Take 1 tablet (100 mcg total) by mouth daily.     Azilsartan-Chlorthalidone (EDARBYCLOR) 40-12.5 MG TABS Take by mouth. 30 tablet    cholecalciferol (VITAMIN D3) 25 MCG (1000 UNIT) tablet Take 1 tablet (1,000 Units total) by mouth daily.     co-enzyme Q-10 30 MG capsule Take 1 capsule (30 mg total) by mouth 3 (three) times daily.     ergocalciferol (VITAMIN D2) 1.25 MG (50000 UT) capsule Take 1 capsule (50,000 Units total) by mouth once a week.     exemestane (AROMASIN) 25 MG tablet Take 1 tablet (25 mg total) by mouth daily after breakfast. 90 tablet 4   fluocinolone (VANOS) 0.01 % cream Apply topically as needed.     gabapentin (NEURONTIN) 300 MG capsule Take 1 capsule (300 mg total) by mouth at bedtime. 90 capsule 4   Turmeric 500 MG TABS Take by mouth.     No current facility-administered medications for this visit.    OBJECTIVE: [place] woman who appears stated age  4:   03/24/21 0855  BP: (!) 131/91  Pulse: 61  Temp: 97.7 F (36.5 C)  SpO2: 100%      Body mass index is 24.35 kg/m.   Wt Readings from Last 3 Encounters:  03/24/21 146 lb 4.8 oz (66.4 kg)  03/23/21 145 lb (65.8 kg)  11/18/20 147 lb 9.6 oz (67 kg)      ECOG FS:1 - Symptomatic but completely ambulatory  Sclerae unicteric, EOMs intact Wearing a mask No cervical or supraclavicular adenopathy Lungs no rales or rhonchi Heart regular rate and rhythm Abd soft, nontender, positive bowel sounds MSK no focal spinal tenderness, no upper extremity lymphedema Neuro: nonfocal, well oriented, appropriate affect Breasts: The right breast is unremarkable.  The left breast is status postlumpectomy and radiation.  There is no evidence of local recurrence.  Both axillae are benign.  LAB RESULTS:  An antinuclear antibody obtained at [facility] 02/03/2021 was negative.  PTH was 33.5, TSH 1.581 and C4 26.9, C3 97.  CMP     Component Value Date/Time   NA 140 03/24/2021 0845   K  3.8 03/24/2021 0845   CL 105 03/24/2021 0845   CO2 25 03/24/2021 0845   GLUCOSE 87 03/24/2021 0845   BUN 10 03/24/2021 0845   CREATININE 0.83 03/24/2021 0845   CREATININE 0.77 08/01/2019 1234   CALCIUM 9.8 03/24/2021 0845   PROT 7.2 03/24/2021 0845   ALBUMIN 4.3 03/24/2021 0845   AST 16 03/24/2021 0845   AST 15 08/01/2019 1234   ALT 13 03/24/2021 0845  ALT 13 08/01/2019 1234   ALKPHOS 78 03/24/2021 0845   BILITOT 0.7 03/24/2021 0845   BILITOT 0.3 08/01/2019 1234   GFRNONAA >60 03/24/2021 0845   GFRNONAA >60 08/01/2019 1234   GFRAA >60 03/14/2020 1053   GFRAA >60 08/01/2019 1234    No results found for: TOTALPROTELP, ALBUMINELP, A1GS, A2GS, BETS, BETA2SER, GAMS, MSPIKE, SPEI  Lab Results  Component Value Date   WBC 3.4 (L) 03/24/2021   NEUTROABS 1.8 03/24/2021   HGB 12.8 03/24/2021   HCT 38.2 03/24/2021   MCV 89.3 03/24/2021   PLT 220 03/24/2021    No results found for: LABCA2  No components found for: OHFGBM211  No results for input(s): INR in the last 168 hours.  No results found for: LABCA2  No results found for: DBZ208  No results found for: YEM336  No results found for: PQA449  No results found for: CA2729  No components found for: HGQUANT  No results found for: CEA1 / No results found for: CEA1   No results found for: AFPTUMOR  No results found for: CHROMOGRNA  No results found for: KPAFRELGTCHN, LAMBDASER, KAPLAMBRATIO (kappa/lambda light chains)  No results found for: HGBA, HGBA2QUANT, HGBFQUANT, HGBSQUAN (Hemoglobinopathy evaluation)   No results found for: LDH  No results found for: IRON, TIBC, IRONPCTSAT (Iron and TIBC)  No results found for: FERRITIN  Urinalysis No results found for: COLORURINE, APPEARANCEUR, LABSPEC, PHURINE, GLUCOSEU, HGBUR, BILIRUBINUR, KETONESUR, PROTEINUR, UROBILINOGEN, NITRITE, LEUKOCYTESUR   STUDIES: No results found.   ELIGIBLE FOR AVAILABLE RESEARCH PROTOCOL: no  ASSESSMENT: 50 y.o. [place]  woman status post left breast upper outer quadrant biopsy 07/26/2019 for a clinical T1b-c N0, stage Ia invasive ductal carcinoma, grade 2, estrogen and progesterone receptor positive, HER-2 not amplified, with an MIB-1 of 2  (1) status post left lumpectomy and sentinel lymph node sampling 08/21/2019 for a pT1c pN1(mic), 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  (a) a single sentinel lymph node was removed  (b) additional surgery 09/11/2019 cleared the margins  (2) Oncotype score of 16 predicts a risk of recurrence outside the breast in the next 9 years of 15% if the patient's only systemic therapy is tamoxifen for 5 years.  It also predicts no apparent benefit from chemotherapy.  (3) adjuvant radiation 10/17/2019 through 11/13/2019 Site/dose:   The patient initially received a dose of 42.56 Gy in 16 fractions to the breast using whole-breast tangent fields. This was delivered using a 3-D conformal technique. The patient then received a boost to the seroma. This delivered an additional 10 Gy in 85factions using an en face electron field due to the depth of the seroma. The total dose was 52.56Gy.  (4) antiestrogens for a minimum of 5 years:  (a) goserelin/Zoladex started 10/12/2019, discontinued after 02/29/2020 dose  (b) anastrozole started 11/27/2019, discontinued May 2022 with multiple side effects  (c) bilateral salpingo-oophorectomy 03/20/2020  (d) bone density 05/19/2020 shows a T score of -0.1 (normal).  (e) exemestane started July 2022  (5) genetics testing 08/09/2019 through the [facility] Breast Cancer STAT and Common Hereditary Cancers panels found no deleterious mutations in ATM, BRCA1, BRCA2, CDH1, CHEK2, PALB2, PTEN, STK11 and TP53.  APC, ATM, AXIN2, BARD1, BMPR1A, BRCA1, BRCA2, BRIP1, CDH1, CDK4, CDKN2A (p14ARF), CDKN2A (p16INK4a), CHEK2, CTNNA1, DICER1, EPCAM (Deletion/duplication testing only), GREM1 (promoter region deletion/duplication  testing only), KIT, MEN1, MLH1, MSH2, MSH3, MSH6, MUTYH, NBN, NF1, NHTL1, PALB2, PDGFRA, PMS2, POLD1, POLE, PTEN, RAD50, RAD51C, RAD51D, RNF43, SDHB, SDHC, SDHD,  SMAD4, SMARCA4. STK11, TP53, TSC1, TSC2, and VHL.  The following genes were evaluated for sequence changes only: SDHA and HOXB13 c.251G>A variant only.  (a) A variant of uncertain significance was detected in the MSH3 gene called c.803G>A (p.Arg268Gln). The report date is 08/09/2019.  (6) rheumatology evaluation at [facility] August 2022 suggests early osteoarthritis possibly with an inflammatory component --patient does have some psoriasis and is concerned that the real diagnosis may be psoriatic arthritis.  Further rheumatologic evaluations planned   PLAN: Monee is now a year and a half out from definitive surgery for her breast cancer with no evidence of disease recurrence.  This is very favorable.  The plan is to continue antiestrogens a minimum of 5 years.  She understands that going to a full 7 years really only has 1 or 2 further percent risk reduction.  The decision can be made when she gets closer to the 5-year mark.  She has had problems with arthralgias.  We have experimented with different antiestrogens and taking breaks between treatment and it really has not made much difference so the issue is really rheumatologic rather than antiestrogen related and accordingly the plan is to stay on exemestane and continue to work to resolution of a definitive rheumatologic diagnosis and treatment.  Right now she is on diclofenac which was started by Dr. Mellody Drown and she is aware of the need to drink plenty of fluids with any nonsteroidals.  She is interested in exploring a low-dose naltrexone and asked me to evaluate that.  A preliminary review does find that at very low doses this medication may have anti-inflammatory effects.  I am not finding obvious contraindications.  She tells me she is going to be working with [employer] in [place], and  alternative medication provider, regarding those issues.  Teya will return to see [place] in January.  She knows to call for any other issue that may develop before then.  Total encounter time 35 minutes.Chauncey Cruel, MD   03/24/2021 9:43 AM Medical Oncology and Hematology [facility] [place], [place] [address] Tel. [phone]    Fax. [phone]   This document serves as a record of services personally performed by Lurline Del, MD. It was created on his behalf by Wilburn Mylar, a trained medical scribe. The creation of this record is based on the scribe's personal observations and the provider's statements to them.   I, Lurline Del MD, have reviewed the above documentation for accuracy and completeness, and I agree with the above.   *Total Encounter Time as defined by the Centers for Medicare and Medicaid Services includes, in addition to the face-to-face time of a patient visit (documented in the note above) non-face-to-face time: obtaining and reviewing outside history, ordering and reviewing medications, tests or procedures, care coordination (communications with other health care professionals or caregivers) and documentation in the medical record.

## 2021-03-23 NOTE — Therapy (Signed)
[place], [place], [address] Phone: [phone]   Fax:  [phone]  Physical Therapy Treatment  Patient Details  Name: Robin Castillo MRN: [id number] Date of Birth: 10-19-1970 Referring Provider (PT): Dr. Donne Hazel   Encounter Date: 03/23/2021   PT End of Session - 03/23/21 0958     Visit Number 2   # unchanged due to screen only   PT Start Time 0951    PT Stop Time 0958    PT Time Calculation (min) 7 min    Activity Tolerance Patient tolerated treatment well    Behavior During Therapy [facility] for tasks assessed/performed             Past Medical History:  Diagnosis Date   Arthritis    per patient new onset in finger joints   BV (bacterial vaginosis) 2012   Cancer ([place]) 2021   left breast    Cyst of ovary, right 2003   Family history of prostate cancer in father    H/O rubella    H/O varicella as child   History of PCOS 06/2005   Hypertension    Infertility, female    Irregular menses 11/2001   Migraine    Pregnancy induced hypertension    Psoriasis    Psychosocial stressors 10/2009    Past Surgical History:  Procedure Laterality Date   BREAST ENHANCEMENT SURGERY Bilateral 2013   BREAST LUMPECTOMY WITH RADIOACTIVE SEED AND SENTINEL LYMPH NODE BIOPSY Left 08/21/2019   Procedure: LEFT BREAST LUMPECTOMY WITH RADIOACTIVE SEED AND LEFT AXILLARY SENTINEL LYMPH NODE BIOPSY;  Surgeon: [name], MD;  Location: [place];  Service: General;  Laterality: Left;   BREAST SURGERY     CESAREAN SECTION     x 1    RE-EXCISION OF BREAST LUMPECTOMY Left 09/11/2019   Procedure: LEFT RE-EXCISION OF BREAST MARGIN;  Surgeon: [name], MD;  Location: [place];  Service: General;  Laterality: Left;   ROBOTIC ASSISTED BILATERAL SALPINGO OOPHERECTOMY Bilateral 03/20/2020   Procedure: XI ROBOTIC ASSISTED BILATERAL SALPINGO OOPHORECTOMY;  Surgeon: [name],  MD;  Location: [place];  Service: Gynecology;  Laterality: Bilateral;  Dr. [name] requesting 2 / 2 1/2 hours    Vitals:   03/23/21 0952  Weight: 145 lb (65.8 kg)     Subjective Assessment - 03/23/21 0953     Subjective Pt returns for her 3 month L-Dex screen.    Pertinent History Patient was diagnosed on 07/25/2019 with left grade II invasive ductal carcinoma breast cancer. Patient reports she underwent a left lumpectomy and sentinel node biopsy (1 node removed with micromets) on 08/21/2019. It is ER/PR positive and HER2 negative with a Ki67 of 2%.She had 20 radiation treatments with booster past histroy of breat implants in 2013, possible psoriatic arthritis and possible carpal tunnel on the right                    L-DEX FLOWSHEETS - 03/23/21 0900       L-DEX LYMPHEDEMA SCREENING   Measurement Type Unilateral    L-DEX MEASUREMENT EXTREMITY Upper Extremity    POSITION  Standing    DOMINANT SIDE Right    At Risk Side Left    BASELINE SCORE (UNILATERAL) -0.8    L-DEX SCORE (UNILATERAL) -2.5    VALUE CHANGE (UNILAT) -1.7  PT Long Term Goals - 08/01/20 1307       PT LONG TERM GOAL #1   Title Patient will be independent with performing self manual techniques and stretching to decrease axillary cording    Time 4    Status New      PT LONG TERM GOAL #2   Title Patient will report >/= 50% less evidence of axiallary cording per her self assessment    Period Weeks    Status New      PT LONG TERM GOAL #3   Title Pt will decreasae Quick DASH score to < 10 demonstrating an improvement in function of her left arm    Baseline 13.64 on 08/01/2020    Time 4    Period Weeks    Status New                   Plan - 03/23/21 6681     Clinical Impression Statement Pt returns for her 3 month L-Dex screen. her change from baseline of -1.7 is WNLs so no further treatment is required at this  time except to cont every 3 month L-Dex screens which pt is agreeable to.    PT Next Visit Plan Cont every 3 month L-Dex screens for up to 2 years from her SLNB. (~08/20/2021)    Consulted and Agree with Plan of Care Patient             Patient will benefit from skilled therapeutic intervention in order to improve the following deficits and impairments:     Visit Diagnosis: Aftercare following surgery for neoplasm     Problem List Patient Active Problem List   Diagnosis Date Noted   B12 deficiency 06/09/2020   Genetic testing 08/10/2019   Family history of prostate cancer in father    Malignant neoplasm of upper-outer quadrant of left breast in female, estrogen receptor positive ([place]) 07/30/2019    [name], PTA 03/23/2021, 10:00 AM  [facility] [place], [place], [address] Phone: [phone]   Fax:  [phone]  Name: Robin Castillo MRN: [id number] Date of Birth: [birth year]

## 2021-03-24 ENCOUNTER — Inpatient Hospital Stay: Payer: BC Managed Care – PPO

## 2021-03-24 ENCOUNTER — Inpatient Hospital Stay: Payer: BC Managed Care – PPO | Attending: Oncology | Admitting: Oncology

## 2021-03-24 VITALS — BP 131/91 | HR 61 | Temp 97.7°F | Wt 146.3 lb

## 2021-03-24 DIAGNOSIS — Z8042 Family history of malignant neoplasm of prostate: Secondary | ICD-10-CM | POA: Diagnosis not present

## 2021-03-24 DIAGNOSIS — Z79899 Other long term (current) drug therapy: Secondary | ICD-10-CM | POA: Insufficient documentation

## 2021-03-24 DIAGNOSIS — Z79811 Long term (current) use of aromatase inhibitors: Secondary | ICD-10-CM | POA: Insufficient documentation

## 2021-03-24 DIAGNOSIS — Z90722 Acquired absence of ovaries, bilateral: Secondary | ICD-10-CM | POA: Diagnosis not present

## 2021-03-24 DIAGNOSIS — Z170 Estrogen receptor positive status [ER+]: Secondary | ICD-10-CM

## 2021-03-24 DIAGNOSIS — C50412 Malignant neoplasm of upper-outer quadrant of left female breast: Secondary | ICD-10-CM | POA: Diagnosis not present

## 2021-03-24 DIAGNOSIS — Z818 Family history of other mental and behavioral disorders: Secondary | ICD-10-CM | POA: Insufficient documentation

## 2021-03-24 DIAGNOSIS — R232 Flushing: Secondary | ICD-10-CM | POA: Insufficient documentation

## 2021-03-24 DIAGNOSIS — Z8249 Family history of ischemic heart disease and other diseases of the circulatory system: Secondary | ICD-10-CM | POA: Insufficient documentation

## 2021-03-24 DIAGNOSIS — G5600 Carpal tunnel syndrome, unspecified upper limb: Secondary | ICD-10-CM | POA: Diagnosis not present

## 2021-03-24 DIAGNOSIS — N6489 Other specified disorders of breast: Secondary | ICD-10-CM | POA: Insufficient documentation

## 2021-03-24 DIAGNOSIS — Z811 Family history of alcohol abuse and dependence: Secondary | ICD-10-CM | POA: Diagnosis not present

## 2021-03-24 LAB — CBC WITH DIFFERENTIAL/PLATELET
Abs Immature Granulocytes: 0.01 10*3/uL (ref 0.00–0.07)
Basophils Absolute: 0 10*3/uL (ref 0.0–0.1)
Basophils Relative: 1 %
Eosinophils Absolute: 0.1 10*3/uL (ref 0.0–0.5)
Eosinophils Relative: 2 %
HCT: 38.2 % (ref 36.0–46.0)
Hemoglobin: 12.8 g/dL (ref 12.0–15.0)
Immature Granulocytes: 0 %
Lymphocytes Relative: 38 %
Lymphs Abs: 1.3 10*3/uL (ref 0.7–4.0)
MCH: 29.9 pg (ref 26.0–34.0)
MCHC: 33.5 g/dL (ref 30.0–36.0)
MCV: 89.3 fL (ref 80.0–100.0)
Monocytes Absolute: 0.3 10*3/uL (ref 0.1–1.0)
Monocytes Relative: 8 %
Neutro Abs: 1.8 10*3/uL (ref 1.7–7.7)
Neutrophils Relative %: 51 %
Platelets: 220 10*3/uL (ref 150–400)
RBC: 4.28 MIL/uL (ref 3.87–5.11)
RDW: 12.4 % (ref 11.5–15.5)
WBC: 3.4 10*3/uL — ABNORMAL LOW (ref 4.0–10.5)
nRBC: 0 % (ref 0.0–0.2)

## 2021-03-24 LAB — COMPREHENSIVE METABOLIC PANEL
ALT: 13 U/L (ref 0–44)
AST: 16 U/L (ref 15–41)
Albumin: 4.3 g/dL (ref 3.5–5.0)
Alkaline Phosphatase: 78 U/L (ref 38–126)
Anion gap: 10 (ref 5–15)
BUN: 10 mg/dL (ref 6–20)
CO2: 25 mmol/L (ref 22–32)
Calcium: 9.8 mg/dL (ref 8.9–10.3)
Chloride: 105 mmol/L (ref 98–111)
Creatinine, Ser: 0.83 mg/dL (ref 0.44–1.00)
GFR, Estimated: >60 mL/min (ref >60)
Glucose, Bld: 87 mg/dL (ref 70–99)
Potassium: 3.8 mmol/L (ref 3.5–5.1)
Sodium: 140 mmol/L (ref 135–145)
Total Bilirubin: 0.7 mg/dL (ref 0.3–1.2)
Total Protein: 7.2 g/dL (ref 6.5–8.1)

## 2021-04-22 NOTE — Progress Notes (Signed)
Office Visit Note  Patient: Robin Castillo             Date of Birth: 13-Feb-1971           MRN: [id number]             PCP: [name], MD Referring: [name], MD Visit Date: 05/05/2021 Occupation: @GUAROCC @  Subjective:  Pain and stiffness in both hands.   History of Present Illness: Robin Castillo is a 50 y.o. female seen in consultation per request of her PCP.  According to the patient her symptoms a started about 40 years ago with hand swelling which will come and go.  She states she also experienced some stiffness in the morning and increased swelling when she would walk.  She had some intentional weight loss and noticed improvement in her symptoms.  About 1-1/2-year ago he she started having increasing stiffness in her hands especially in the morning which got worse over the next 6 months.  She states now she has persistent swelling although she still have flares with increased swelling.  She notices tingling in the tip of her fingers.  She is also had some tendinitis in her bilateral wrist joint more prominent in her left and the right wrist.  She uses bilateral carpal tunnel braces at nighttime.  The braces were prescribed to her by her orthopedic surgeon for possible carpal tunnel brace.  She states about 6 months ago she developed swelling in her bilateral feet which subsided after 2 weeks.  Patient states that she was seen at [facility] rheumatology early this ER where she had lab work which was all unremarkable.  She also had x-rays of her hands which were normal.  She was given a prescription of Otezla by Dr. Amil Amen but she did not restart it.  She was also seen by orthopedic surgeons and had x-rays of her bilateral hands which were unremarkable.  She denies any discomfort in her cervical, lumbar, SI joints, shoulders, elbows, hips, knees, feet.  There is no history of Planter fasciitis or Achilles tendinitis.  No history of uveitis.  She gives history of psoriasis diagnosed in  2013 by her dermatologist based on the examination.  She did not get a biopsy.  She has been using Vanos topical steroid cream which is effective.  She has few lesions on her abdomen and chest.  She is gravida 4, para 2, miscarriage 1, abortion 1.  There is no history of DVTs.  There is no family history of psoriasis.  Her sister has arthritis in her hands and feet with no particular diagnosis given.  Activities of Daily Living:  Patient reports morning stiffness for less than 5 minutes.   Patient Denies nocturnal pain.  Difficulty dressing/grooming: Denies Difficulty climbing stairs: Denies Difficulty getting out of chair: Denies Difficulty using hands for taps, buttons, cutlery, and/or writing: Denies  Review of Systems  Constitutional:  Negative for fatigue.  HENT:  Negative for mouth sores, mouth dryness and nose dryness.   Eyes:  Negative for pain, itching and dryness.  Respiratory:  Negative for shortness of breath and difficulty breathing.   Cardiovascular:  Positive for hypertension. Negative for chest pain and palpitations.  Gastrointestinal:  Negative for blood in stool, constipation and diarrhea.  Endocrine: Negative for increased urination.  Genitourinary:  Negative for difficulty urinating.  Musculoskeletal:  Positive for morning stiffness. Negative for joint pain, joint pain, joint swelling, myalgias, muscle tenderness and myalgias.  Skin:  Positive for rash. Negative for color  change and sensitivity to sunlight.  Allergic/Immunologic: Negative for susceptible to infections.  Neurological:  Positive for numbness. Negative for dizziness, headaches, memory loss and weakness.  Hematological:  Negative for bruising/bleeding tendency and swollen glands.  Psychiatric/Behavioral:  Negative for depressed mood, confusion and sleep disturbance. The patient is nervous/anxious.    PMFS History:  Patient Active Problem List   Diagnosis Date Noted   Psoriasis 05/05/2021   B12 deficiency  06/09/2020   Genetic testing 08/10/2019   Family history of prostate cancer in father    Malignant neoplasm of upper-outer quadrant of left breast in female, estrogen receptor positive ([place]) 07/30/2019    Past Medical History:  Diagnosis Date   Arthritis    per patient new onset in finger joints   BV (bacterial vaginosis) 2012   Cancer ([place]) 2021   left breast    Cyst of ovary, right 2003   Family history of prostate cancer in father    H/O rubella    H/O varicella as child   History of PCOS 06/2005   Hypertension    Infertility, female    Irregular menses 11/2001   Migraine    Pregnancy induced hypertension    Psoriasis    Psychosocial stressors 10/2009    Family History  Problem Relation Age of Onset   Heart disease Mother    Hypertension Mother    Heart disease Father    Depression Father    Alcohol abuse Father    Prostate cancer Father 34   Hypertension Sister    Hypercholesterolemia Sister    Heart disease Maternal Grandmother    Heart disease Maternal Grandfather    Healthy Son    Healthy Son    Past Surgical History:  Procedure Laterality Date   BREAST ENHANCEMENT SURGERY Bilateral 2013   BREAST LUMPECTOMY WITH RADIOACTIVE SEED AND SENTINEL LYMPH NODE BIOPSY Left 08/21/2019   Procedure: LEFT BREAST LUMPECTOMY WITH RADIOACTIVE SEED AND LEFT AXILLARY SENTINEL LYMPH NODE BIOPSY;  Surgeon: [name], MD;  Location: [facility];  Service: General;  Laterality: Left;   BREAST SURGERY     CESAREAN SECTION     x 1    RE-EXCISION OF BREAST LUMPECTOMY Left 09/11/2019   Procedure: LEFT RE-EXCISION OF BREAST MARGIN;  Surgeon: [name], MD;  Location: [facility];  Service: General;  Laterality: Left;   ROBOTIC ASSISTED BILATERAL SALPINGO OOPHERECTOMY Bilateral 03/20/2020   Procedure: XI ROBOTIC ASSISTED BILATERAL SALPINGO OOPHORECTOMY;  Surgeon: [name], MD;  Location: [facility];  Service: Gynecology;   Laterality: Bilateral;  Dr. [name] requesting 2 / 2 1/2 hours   Social History   Social History Narrative   Not on file   Immunization History  Administered Date(s) Administered   Influenza,inj,Quad PF,6+ Mos 03/18/2018   PFIZER(Purple Top)SARS-COV-2 Vaccination 10/05/2019, 10/31/2019     Objective: Vital Signs: BP (!) 152/92 (BP Location: Left Arm, Patient Position: Sitting, Cuff Size: Normal)   Pulse 69   Ht 5\' 5"  (1.651 m)   Wt 144 lb 12.8 oz (65.7 kg)   BMI 24.10 kg/m    Physical Exam Vitals and nursing note reviewed.  Constitutional:      Appearance: She is well-developed.  HENT:     Head: Normocephalic and atraumatic.  Eyes:     Conjunctiva/sclera: Conjunctivae normal.  Cardiovascular:     Rate and Rhythm: Normal rate and regular rhythm.     Heart sounds: Normal heart sounds.  Pulmonary:     Effort:  Pulmonary effort is normal.     Breath sounds: Normal breath sounds.  Abdominal:     General: Bowel sounds are normal.     Palpations: Abdomen is soft.  Musculoskeletal:     Cervical back: Normal range of motion.  Lymphadenopathy:     Cervical: No cervical adenopathy.  Skin:    General: Skin is warm and dry.     Capillary Refill: Capillary refill takes less than 2 seconds.  Neurological:     Mental Status: She is alert and oriented to person, place, and time.  Psychiatric:        Behavior: Behavior normal.     Musculoskeletal Exam: C-spine, thoracic and lumbar spine were in good range of motion with no synovitis.  She had no SI joint tenderness.  Shoulder joints, elbow joints, wrist joints, MCPs PIPs and DIPs with good range of motion with no synovitis.  Some DIP thickening consistent with early osteoarthritic changes was noted.  No nail pitting or nail dystrophy was noted.  Hip joints and knee joints with good range of motion without any warmth swelling or effusion.  She had bilateral first MTP thickening.  No evidence of plantar fasciitis or Achilles tendinitis  was noted.  CDAI Exam: CDAI Score: -- Patient Global: --; Provider Global: -- Swollen: --; Tender: -- Joint Exam 05/05/2021   No joint exam has been documented for this visit   There is currently no information documented on the homunculus. Go to the Rheumatology activity and complete the homunculus joint exam.  Investigation: No additional findings.  Imaging: No results found.  Recent Labs: Lab Results  Component Value Date   WBC 3.4 (L) 03/24/2021   HGB 12.8 03/24/2021   PLT 220 03/24/2021   NA 140 03/24/2021   K 3.8 03/24/2021   CL 105 03/24/2021   CO2 25 03/24/2021   GLUCOSE 87 03/24/2021   BUN 10 03/24/2021   CREATININE 0.83 03/24/2021   BILITOT 0.7 03/24/2021   ALKPHOS 78 03/24/2021   AST 16 03/24/2021   ALT 13 03/24/2021   PROT 7.2 03/24/2021   ALBUMIN 4.3 03/24/2021   CALCIUM 9.8 03/24/2021   GFRAA >60 03/14/2020   Labs done at [facility] February 03, 2021 ANA negative,C3-C4 normal, PTH normal, TSH normal, calcium normal, albumin normal.   Speciality Comments: No specialty comments available.  Procedures:  No procedures performed Allergies: Patient has no known allergies.   Assessment / Plan:     Visit Diagnoses: Psoriasis-patient has few scattered erythematous patches on her abdomen which were diagnosed as psoriasis by her dermatologist.  She is being topical agents which has been effective.   Pain in both hands -she complains of pain and stiffness in her bilateral hands for last 4 years.  She also notices intermittent swelling.  She states her hands are puffy and it is difficult to put on rings.  She had evaluation by Dr. Burney Gauze and had x-rays of her bilateral hands which were unremarkable.  She was given diclofenac which she took for some time and then stopped as she was concerned about the side effects.  She was also evaluated by Dr. Amil Amen in March 2022.  She states the x-rays of her bilateral hands were unremarkable.  He also did lab work which was all  unremarkable.  He gave her a prescription for [facility] which she did not start.  Eval at [facility] rheum-August 2022.  Labs from [facility] were reviewed.  ANA was negative.  I do not see any synovitis on  my examination today.  She has some puffiness in her hands.  No nail pitting was noted.  I recommended getting ultrasound of her bilateral hands to look for synovitis.  Joint protection muscle strengthening was discussed.  Natural anti-inflammatories were discussed.  Pain in both feet-there is no history of Achilles tendinitis or plantar fasciitis.  She states she had an episode when she developed to have swelling in her feet which lasted for couple of weeks and then resolved.  No synovitis was noted.  Examination was consistent with osteoarthritis.  She had bilateral first MTP arthritis with bunion formation.  Essential hypertension-she has elevated blood pressure despite being on medications.  She has been followed by her PCP.  Malignant neoplasm of upper-outer quadrant of left breast in female, estrogen receptor positive [facility]) - Dx January 2021.lumpectomy and RTX.On Exemestane.  Vitamin B12 deficiency  Vitamin D deficiency  History of PCOS  Orders: No orders of the defined types were placed in this encounter.  No orders of the defined types were placed in this encounter.   Face-to-face time spent with patient was 45 minutes. Greater than 50% of time was spent in counseling and coordination of care.  Follow-Up Instructions: Return in about 1 year (around 05/05/2022) for Pain in both hands.   Bo Merino, MD  Note - This record has been created using Editor, commissioning.  Chart creation errors have been sought, but may not always  have been located. Such creation errors do not reflect on  the standard of medical care.

## 2021-05-05 ENCOUNTER — Ambulatory Visit: Payer: BC Managed Care – PPO | Admitting: Rheumatology

## 2021-05-05 ENCOUNTER — Other Ambulatory Visit: Payer: Self-pay

## 2021-05-05 ENCOUNTER — Encounter: Payer: Self-pay | Admitting: Rheumatology

## 2021-05-05 VITALS — BP 152/92 | HR 69 | Ht 65.0 in | Wt 144.8 lb

## 2021-05-05 DIAGNOSIS — E538 Deficiency of other specified B group vitamins: Secondary | ICD-10-CM

## 2021-05-05 DIAGNOSIS — C50412 Malignant neoplasm of upper-outer quadrant of left female breast: Secondary | ICD-10-CM

## 2021-05-05 DIAGNOSIS — L409 Psoriasis, unspecified: Secondary | ICD-10-CM

## 2021-05-05 DIAGNOSIS — M79641 Pain in right hand: Secondary | ICD-10-CM | POA: Diagnosis not present

## 2021-05-05 DIAGNOSIS — I10 Essential (primary) hypertension: Secondary | ICD-10-CM

## 2021-05-05 DIAGNOSIS — Z8742 Personal history of other diseases of the female genital tract: Secondary | ICD-10-CM

## 2021-05-05 DIAGNOSIS — M79671 Pain in right foot: Secondary | ICD-10-CM

## 2021-05-05 DIAGNOSIS — M79642 Pain in left hand: Secondary | ICD-10-CM

## 2021-05-05 DIAGNOSIS — E559 Vitamin D deficiency, unspecified: Secondary | ICD-10-CM

## 2021-05-05 DIAGNOSIS — M79672 Pain in left foot: Secondary | ICD-10-CM

## 2021-05-05 DIAGNOSIS — Z170 Estrogen receptor positive status [ER+]: Secondary | ICD-10-CM

## 2021-05-05 NOTE — Patient Instructions (Signed)
Hand Exercises Hand exercises can be helpful for almost anyone. These exercises can strengthen the hands, improve flexibility and movement, and increase blood flow to the hands. These results can make work and daily tasks easier. Hand exercises can be especially helpful for people who have joint pain from arthritis or have nerve damage from overuse (carpal tunnel syndrome). These exercises can also help people who have injured a hand. Exercises Most of these hand exercises are gentle stretching and motion exercises. It is usually safe to do them often throughout the day. Warming up your hands before exercise may help to reduce stiffness. You can do this with gentle massage or by placing your hands in warm water for 10-15 minutes. It is normal to feel some stretching, pulling, tightness, or mild discomfort as you begin new exercises. This will gradually improve. Stop an exercise right away if you feel sudden, severe pain or your pain gets worse. Ask your health care provider which exercises are best for you. Knuckle bend or "claw" fist  Stand or sit with your arm, hand, and all five fingers pointed straight up. Make sure to keep your wrist straight during the exercise. Gently bend your fingers down toward your palm until the tips of your fingers are touching the top of your palm. Keep your big knuckle straight and just bend the small knuckles in your fingers. Hold this position for __________ seconds. Straighten (extend) your fingers back to the starting position. Repeat this exercise 5-10 times with each hand. Full finger fist  Stand or sit with your arm, hand, and all five fingers pointed straight up. Make sure to keep your wrist straight during the exercise. Gently bend your fingers into your palm until the tips of your fingers are touching the middle of your palm. Hold this position for __________ seconds. Extend your fingers back to the starting position, stretching every joint fully. Repeat  this exercise 5-10 times with each hand. Straight fist Stand or sit with your arm, hand, and all five fingers pointed straight up. Make sure to keep your wrist straight during the exercise. Gently bend your fingers at the big knuckle, where your fingers meet your hand, and the middle knuckle. Keep the knuckle at the tips of your fingers straight and try to touch the bottom of your palm. Hold this position for __________ seconds. Extend your fingers back to the starting position, stretching every joint fully. Repeat this exercise 5-10 times with each hand. Tabletop  Stand or sit with your arm, hand, and all five fingers pointed straight up. Make sure to keep your wrist straight during the exercise. Gently bend your fingers at the big knuckle, where your fingers meet your hand, as far down as you can while keeping the small knuckles in your fingers straight. Think of forming a tabletop with your fingers. Hold this position for __________ seconds. Extend your fingers back to the starting position, stretching every joint fully. Repeat this exercise 5-10 times with each hand. Finger spread  Place your hand flat on a table with your palm facing down. Make sure your wrist stays straight as you do this exercise. Spread your fingers and thumb apart from each other as far as you can until you feel a gentle stretch. Hold this position for __________ seconds. Bring your fingers and thumb tight together again. Hold this position for __________ seconds. Repeat this exercise 5-10 times with each hand. Making circles  Stand or sit with your arm, hand, and all five fingers pointed   straight up. Make sure to keep your wrist straight during the exercise. Make a circle by touching the tip of your thumb to the tip of your index finger. Hold for __________ seconds. Then open your hand wide. Repeat this motion with your thumb and each finger on your hand. Repeat this exercise 5-10 times with each hand. Thumb  motion  Sit with your forearm resting on a table and your wrist straight. Your thumb should be facing up toward the ceiling. Keep your fingers relaxed as you move your thumb. Lift your thumb up as high as you can toward the ceiling. Hold for __________ seconds. Bend your thumb across your palm as far as you can, reaching the tip of your thumb for the small finger (pinkie) side of your palm. Hold for __________ seconds. Repeat this exercise 5-10 times with each hand. Grip strengthening  Hold a stress ball or other soft ball in the middle of your hand. Slowly increase the pressure, squeezing the ball as much as you can without causing pain. Think of bringing the tips of your fingers into the middle of your palm. All of your finger joints should bend when doing this exercise. Hold your squeeze for __________ seconds, then relax. Repeat this exercise 5-10 times with each hand. Contact a health care provider if: Your hand pain or discomfort gets much worse when you do an exercise. Your hand pain or discomfort does not improve within 2 hours after you exercise. If you have any of these problems, stop doing these exercises right away. Do not do them again unless your health care provider says that you can. Get help right away if: You develop sudden, severe hand pain or swelling. If this happens, stop doing these exercises right away. Do not do them again unless your health care provider says that you can. This information is not intended to replace advice given to you by your health care provider. Make sure you discuss any questions you have with your health care provider. Document Revised: 10/02/2020 Document Reviewed: 10/02/2020 Elsevier Patient Education  2022 [employer]  

## 2021-05-13 ENCOUNTER — Telehealth: Payer: Self-pay | Admitting: *Deleted

## 2021-05-13 NOTE — Telephone Encounter (Signed)
This RN spoke with pt per her call stating ongoing area of concern under left axilla - " but not like in the arm pit "  She states she has history of cording under left arm - was seen by PT - did therapy and " had my son pop it " - which overall was beneficial.  She has noticed though " just something is not right and I feel like lumpiness in the armpit that goes up my arm and then another area that seems to go to my back"  Per discussion - this RN recommended need for further evaluation so if scan needed - documentation for appropriate order can be made ( for pre auth ) and or return to PT.  Jerene verbalized understanding - appt made with [facility]/NP

## 2021-05-13 NOTE — Progress Notes (Signed)
[place]  Telephone:(336) [phone] Fax:(336) [phone]     ID: Robin Castillo DOB: 27-Mar-1971  MR#: [id number]  BBU#:[id number]  Patient Care Team: [name], MD as PCP - General (Internal Medicine) Kyung Rudd, MD as Consulting Physician (Radiation Oncology) [name], MD as Consulting Physician (General Surgery) Belva Crome, MD as Consulting Physician (Cardiology) Charlotte Crumb, MD as Consulting Physician (Orthopedic Surgery) Scot Dock, NP OTHER MD:   CHIEF COMPLAINT: Invasive ductal breast cancer, estrogen receptor positive  CURRENT TREATMENT: exemestane   INTERVAL HISTORY: Robin Castillo returns today for follow up of her estrogen receptor positive breast cancer.  She continues on exemestane with good tolerance.  She is tolerating exemestane much better than the previous antiestrogen therapies.  She is here today for concern under her left axilla.  She noted that after her surgery she had significant cording and she struggle with left arm mobility.  She noted that a few weeks after surgery with her stretching she was able to "pop" the cord, and has not felt it is significantly since.  She notes that recently she has felt what she is concerned could be lymphadenopathy with a small knot under her left axilla.  This is posterior and at the edge of her axilla.  REVIEW OF SYSTEMS: Review of Systems  Constitutional:  Negative for appetite change, chills, fatigue, fever and unexpected weight change.  HENT:   Negative for hearing loss, lump/mass and trouble swallowing.   Eyes:  Negative for eye problems and icterus.  Respiratory:  Negative for chest tightness, cough and shortness of breath.   Cardiovascular:  Negative for chest pain, leg swelling and palpitations.  Gastrointestinal:  Negative for abdominal distention, abdominal pain, constipation, diarrhea, nausea and vomiting.  Endocrine: Negative for hot flashes.  Genitourinary:  Negative for difficulty  urinating.   Musculoskeletal:  Negative for arthralgias.  Skin:  Negative for itching and rash.  Neurological:  Negative for dizziness, extremity weakness, headaches and numbness.  Hematological:  Negative for adenopathy. Does not bruise/bleed easily.  Psychiatric/Behavioral:  Negative for depression. The patient is not nervous/anxious.      COVID 19 VACCINATION STATUS: Status post Pfizer x2 as of September 2022  HISTORY OF CURRENT ILLNESS: From the original intake note:  Robin Castillo presented with a palpable left breast lump. She underwent bilateral diagnostic mammography with tomography and left breast ultrasonography at [facility] on 07/25/2019 showing: breast density category B; mass with associated clustered calcifications (overall dimension up to 1.2 cm on mammogram and 0.9 cm on ultrasound) within upper-outer left breast at 1 o'clock; left axillary lymph node appears within normal limits.  Accordingly on 07/26/2019 she proceeded to biopsy of the left breast area in question. The pathology from this procedure ([id number]) showed: invasive ductal carcinoma, grade 2. Prognostic indicators significant for: estrogen receptor, 70% positive with moderate staining intensity and progesterone receptor, 95% positive with strong staining intensity. Proliferation marker Ki67 at 2%. HER2 negative by immunohistochemistry (1+).  The patient's subsequent history is as detailed below.   PAST MEDICAL HISTORY: Past Medical History:  Diagnosis Date   Arthritis    per patient new onset in finger joints   BV (bacterial vaginosis) 2012   Cancer [facility]) 2021   left breast    Cyst of ovary, right 2003   Family history of prostate cancer in father    H/O rubella    H/O varicella as child   History of PCOS 06/2005   Hypertension    Infertility, female  Irregular menses 11/2001   Migraine    Pregnancy induced hypertension    Psoriasis    Psychosocial stressors 10/2009    PAST SURGICAL  HISTORY: Past Surgical History:  Procedure Laterality Date   BREAST ENHANCEMENT SURGERY Bilateral 2013   BREAST LUMPECTOMY WITH RADIOACTIVE SEED AND SENTINEL LYMPH NODE BIOPSY Left 08/21/2019   Procedure: LEFT BREAST LUMPECTOMY WITH RADIOACTIVE SEED AND LEFT AXILLARY SENTINEL LYMPH NODE BIOPSY;  Surgeon: [name], MD;  Location: [place];  Service: General;  Laterality: Left;   BREAST SURGERY     CESAREAN SECTION     x 1    RE-EXCISION OF BREAST LUMPECTOMY Left 09/11/2019   Procedure: LEFT RE-EXCISION OF BREAST MARGIN;  Surgeon: [name], MD;  Location: [place];  Service: General;  Laterality: Left;   ROBOTIC ASSISTED BILATERAL SALPINGO OOPHERECTOMY Bilateral 03/20/2020   Procedure: XI ROBOTIC ASSISTED BILATERAL SALPINGO OOPHORECTOMY;  Surgeon: [name], MD;  Location: [place];  Service: Gynecology;  Laterality: Bilateral;  Dr. [name] requesting 2 / 2 1/2 hours    FAMILY HISTORY: Family History  Problem Relation Age of Onset   Heart disease Mother    Hypertension Mother    Heart disease Father    Depression Father    Alcohol abuse Father    Prostate cancer Father 90   Hypertension Sister    Hypercholesterolemia Sister    Heart disease Maternal Grandmother    Heart disease Maternal Grandfather    Healthy Son    Healthy Son   Patient's father is 59 years old and patient's mother 62 years old (as of 07/2019) The patient denies a family hx of breast or ovarian cancer. She does report her father was recently diagnosed with prostate cancer. She has 1 sister.   GYNECOLOGIC HISTORY:  No LMP recorded. (Menstrual status: Oophorectomy). Menarche: 50 years old Age at first live birth: 50 years old Robin Castillo P 2 LMP 07/28/2019, periods are irregular Contraceptive: yes, used for more than 20 years HRT n/a  Hysterectomy? no BSO? yes   SOCIAL HISTORY: (updated 11/2019)  Robin Castillo is of [place] extraction.  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 Husband Robin Castillo is a self-employed Forensic psychologist. She lives at home with her husband and two sons: Robin Castillo  14 and Robin Castillo. She attends a local [employer]   ADVANCED DIRECTIVES: In the absence of any documentation to the contrary, the patient's spouse is their HCPOA.    HEALTH MAINTENANCE: Social History   Tobacco Use   Smoking status: Never   Smokeless tobacco: Never  Vaping Use   Vaping Use: Never used  Substance Use Topics   Alcohol use: Not Currently   Drug use: No     Colonoscopy: n/a (age)  PAP: 06/2018  Bone density: n/a (age)   No Known Allergies  Current Outpatient Medications  Medication Sig Dispense Refill   Azilsartan-Chlorthalidone (EDARBYCLOR) 40-12.5 MG TABS Take by mouth. 30 tablet    cholecalciferol (VITAMIN D3) 25 MCG (1000 UNIT) tablet Take 1 tablet (1,000 Units total) by mouth daily. (Patient not taking: Reported on 05/05/2021)     co-enzyme Q-10 30 MG capsule Take 1 capsule (30 mg total) by mouth 3 (three) times daily.     ergocalciferol (VITAMIN D2) 1.25 MG (50000 UT) capsule Take 1 capsule (50,000 Units total) by mouth once a week.     exemestane (AROMASIN) 25 MG  tablet Take 1 tablet (25 mg total) by mouth daily after breakfast. 90 tablet 4   fluocinolone (VANOS) 0.01 % cream Apply topically as needed.     gabapentin (NEURONTIN) 300 MG capsule Take 1 capsule (300 mg total) by mouth at bedtime. 90 capsule 4   lisinopril-hydrochlorothiazide (ZESTORETIC) 10-12.5 MG tablet Take 1 tablet by mouth daily. (Patient not taking: Reported on 05/05/2021)     NALTREXONE HCL PO Take 1.5 mg by mouth daily.     Omega-3 Fatty Acids (FISH OIL PO) Take by mouth daily.     Turmeric 500 MG TABS PATIENT TAKES IN DIET     vitamin B-12 (CYANOCOBALAMIN) 100 MCG tablet Take 1 tablet (100 mcg total) by mouth daily.     No current facility-administered medications for this visit.     OBJECTIVE: [place] woman who appears stated age  50:   05/14/21 1323  BP: 113/80  Pulse: 90  Resp: 16  Temp: 97.9 F (36.6 C)  SpO2: 100%       Body mass index is 23.58 kg/m.   Wt Readings from Last 3 Encounters:  05/14/21 141 lb 11.2 oz (64.3 kg)  05/05/21 144 lb 12.8 oz (65.7 kg)  03/24/21 146 lb 4.8 oz (66.4 kg)      ECOG FS:1 - Symptomatic but completely ambulatory GENERAL: Patient is a well appearing female in no acute distress HEENT:  Sclerae anicteric.  Normocephalic NODES:  No cervical, supraclavicular, or axillary lymphadenopathy palpated.  BREAST EXAM: Unable to palpate any axillary adenopathy today.  No nodules whatsoever. EXTREMITIES:  No peripheral edema.   SKIN:  Clear with no obvious rashes or skin changes. No nail dyscrasia. NEURO:  Nonfocal. Well oriented.  Appropriate affect.   LAB RESULTS:  An antinuclear antibody obtained at [facility] 02/03/2021 was negative.  PTH was 33.5, TSH 1.581 and C4 26.9, C3 97.  CMP     Component Value Date/Time   NA 140 03/24/2021 0845   K 3.8 03/24/2021 0845   CL 105 03/24/2021 0845   CO2 25 03/24/2021 0845   GLUCOSE 87 03/24/2021 0845   BUN 10 03/24/2021 0845   CREATININE 0.83 03/24/2021 0845   CREATININE 0.77 08/01/2019 1234   CALCIUM 9.8 03/24/2021 0845   PROT 7.2 03/24/2021 0845   ALBUMIN 4.3 03/24/2021 0845   AST 16 03/24/2021 0845   AST 15 08/01/2019 1234   ALT 13 03/24/2021 0845   ALT 13 08/01/2019 1234   ALKPHOS 78 03/24/2021 0845   BILITOT 0.7 03/24/2021 0845   BILITOT 0.3 08/01/2019 1234   GFRNONAA >60 03/24/2021 0845   GFRNONAA >60 08/01/2019 1234   GFRAA >60 03/14/2020 1053   GFRAA >60 08/01/2019 1234    No results found for: TOTALPROTELP, ALBUMINELP, A1GS, A2GS, BETS, BETA2SER, GAMS, MSPIKE, SPEI  Lab Results  Component Value Date   WBC 3.4 (L) 03/24/2021   NEUTROABS 1.8 03/24/2021   HGB 12.8 03/24/2021   HCT 38.2 03/24/2021   MCV 89.3 03/24/2021   PLT 220 03/24/2021    No results  found for: LABCA2  No components found for: RAQTMA263  No results for input(s): INR in the last 168 hours.  No results found for: LABCA2  No results found for: FHL456  No results found for: YBW389  No results found for: HTD428  No results found for: CA2729  No components found for: HGQUANT  No results found for: CEA1 / No results found for: CEA1   No results found for: AFPTUMOR  No results found  for: CHROMOGRNA  No results found for: KPAFRELGTCHN, LAMBDASER, KAPLAMBRATIO (kappa/lambda light chains)  No results found for: HGBA, HGBA2QUANT, HGBFQUANT, HGBSQUAN (Hemoglobinopathy evaluation)   No results found for: LDH  No results found for: IRON, TIBC, IRONPCTSAT (Iron and TIBC)  No results found for: FERRITIN  Urinalysis No results found for: COLORURINE, APPEARANCEUR, LABSPEC, PHURINE, GLUCOSEU, HGBUR, BILIRUBINUR, KETONESUR, PROTEINUR, UROBILINOGEN, NITRITE, LEUKOCYTESUR   STUDIES: No results found.   ELIGIBLE FOR AVAILABLE RESEARCH PROTOCOL: no  ASSESSMENT: 50 y.o. [place] woman status post left breast upper outer quadrant biopsy 07/26/2019 for a clinical T1b-c N0, stage Ia invasive ductal carcinoma, grade 2, estrogen and progesterone receptor positive, HER-2 not amplified, with an MIB-1 of 2  (1) status post left lumpectomy and sentinel lymph node sampling 08/21/2019 for a pT1c pN1(mic), 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  (a) a single sentinel lymph node was removed  (b) additional surgery 09/11/2019 cleared the margins  (2) Oncotype score of 16 predicts a risk of recurrence outside the breast in the next 9 years of 15% if the patient's only systemic therapy is tamoxifen for 5 years.  It also predicts no apparent benefit from chemotherapy.  (3) adjuvant radiation 10/17/2019 through 11/13/2019 Site/dose:   The patient initially received a dose of 42.56 Gy in 16 fractions to the breast using whole-breast  tangent fields. This was delivered using a 3-D conformal technique. The patient then received a boost to the seroma. This delivered an additional 10 Gy in 65factions using an en face electron field due to the depth of the seroma. The total dose was 52.56Gy.  (4) antiestrogens for a minimum of 5 years:  (a) goserelin/Zoladex started 10/12/2019, discontinued after 02/29/2020 dose  (b) anastrozole started 11/27/2019, discontinued May 2022 with multiple side effects  (c) bilateral salpingo-oophorectomy 03/20/2020  (d) bone density 05/19/2020 shows a T score of -0.1 (normal).  (e) exemestane started July 2022  (5) genetics testing 08/09/2019 through the [facility] Breast Cancer STAT and Common Hereditary Cancers panels found no deleterious mutations in ATM, BRCA1, BRCA2, CDH1, CHEK2, PALB2, PTEN, STK11 and TP53.  APC, ATM, AXIN2, BARD1, BMPR1A, BRCA1, BRCA2, BRIP1, CDH1, CDK4, CDKN2A (p14ARF), CDKN2A (p16INK4a), CHEK2, CTNNA1, DICER1, EPCAM (Deletion/duplication testing only), GREM1 (promoter region deletion/duplication testing only), KIT, MEN1, MLH1, MSH2, MSH3, MSH6, MUTYH, NBN, NF1, NHTL1, PALB2, PDGFRA, PMS2, POLD1, POLE, PTEN, RAD50, RAD51C, RAD51D, RNF43, SDHB, SDHC, SDHD, SMAD4, SMARCA4. STK11, TP53, TSC1, TSC2, and VHL.  The following genes were evaluated for sequence changes only: SDHA and HOXB13 c.251G>A variant only.  (a) A variant of uncertain significance was detected in the MSH3 gene called c.803G>A (p.Arg268Gln). The report date is 08/09/2019.  (6[facility] possibly with an inflammatory component --patient does have some psoriasis and is concerned that the real diagnosis may be psoriatic arthritis.  Further rheumatologic evaluations planned   PLAN: EKimikois here today for urgent evaluation of what she is concerned is axillary adenopathy.  We cannot feel anything today on exam.   since we can no longer feel the abnormality it is  really hard to tell her what is going on there.   If the area returns we will [facility]  EMeaghannverbalizes understanding and will call me if we need to get an ultrasound.  She has follow-up in January with Dr. GPayton Mccallum  She knows to call for any questions or concerns that  may arise between now and her next visit.  Total encounter time 20 minutes.*In face-to-face visit time, chart review, lab review, care coordination, and documentation of the encounter.  Wilber Bihari, NP 05/14/21 2:02 PM Medical Oncology and Hematology [facility] [place], [place] [address] Tel. [phone]    Fax. [phone]    *Total Encounter Time as defined by the Centers for Medicare and Medicaid Services includes, in addition to the face-to-face time of a patient visit (documented in the note above) non-face-to-face time: obtaining and reviewing outside history, ordering and reviewing medications, tests or procedures, care coordination (communications with other health care professionals or caregivers) and documentation in the medical record.

## 2021-05-14 ENCOUNTER — Telehealth: Payer: Self-pay

## 2021-05-14 ENCOUNTER — Inpatient Hospital Stay: Payer: BC Managed Care – PPO | Attending: Oncology | Admitting: Adult Health

## 2021-05-14 ENCOUNTER — Other Ambulatory Visit: Payer: Self-pay

## 2021-05-14 ENCOUNTER — Other Ambulatory Visit: Payer: BC Managed Care – PPO | Admitting: Rheumatology

## 2021-05-14 VITALS — BP 113/80 | HR 90 | Temp 97.9°F | Resp 16 | Ht 65.0 in | Wt 141.7 lb

## 2021-05-14 DIAGNOSIS — Z170 Estrogen receptor positive status [ER+]: Secondary | ICD-10-CM | POA: Diagnosis not present

## 2021-05-14 DIAGNOSIS — Z811 Family history of alcohol abuse and dependence: Secondary | ICD-10-CM | POA: Diagnosis not present

## 2021-05-14 DIAGNOSIS — Z8042 Family history of malignant neoplasm of prostate: Secondary | ICD-10-CM | POA: Diagnosis not present

## 2021-05-14 DIAGNOSIS — Z8249 Family history of ischemic heart disease and other diseases of the circulatory system: Secondary | ICD-10-CM | POA: Insufficient documentation

## 2021-05-14 DIAGNOSIS — R590 Localized enlarged lymph nodes: Secondary | ICD-10-CM | POA: Insufficient documentation

## 2021-05-14 DIAGNOSIS — Z818 Family history of other mental and behavioral disorders: Secondary | ICD-10-CM | POA: Diagnosis not present

## 2021-05-14 DIAGNOSIS — N6489 Other specified disorders of breast: Secondary | ICD-10-CM | POA: Diagnosis not present

## 2021-05-14 DIAGNOSIS — Z79811 Long term (current) use of aromatase inhibitors: Secondary | ICD-10-CM | POA: Insufficient documentation

## 2021-05-14 DIAGNOSIS — Z90722 Acquired absence of ovaries, bilateral: Secondary | ICD-10-CM | POA: Insufficient documentation

## 2021-05-14 DIAGNOSIS — Z79899 Other long term (current) drug therapy: Secondary | ICD-10-CM | POA: Insufficient documentation

## 2021-05-14 DIAGNOSIS — C50412 Malignant neoplasm of upper-outer quadrant of left female breast: Secondary | ICD-10-CM

## 2021-05-14 DIAGNOSIS — Z8349 Family history of other endocrine, nutritional and metabolic diseases: Secondary | ICD-10-CM | POA: Diagnosis not present

## 2021-05-14 NOTE — Telephone Encounter (Signed)
Called pt to locate most recent MM results.  Pt states she has them completed at [facility], most recent 11/2020.

## 2021-06-03 ENCOUNTER — Ambulatory Visit: Payer: BC Managed Care – PPO | Admitting: Rheumatology

## 2021-06-10 NOTE — Progress Notes (Signed)
Office Visit Note  Patient: Robin Castillo             Date of Birth: 1971/05/15           MRN: [id number]             PCP: [name], MD Referring: [name], MD Visit Date: 06/15/2021 Occupation: @GUAROCC @  Subjective:  Joint swelling.   History of Present Illness: Robin Castillo is a 49 y.o. female with history of psoriasis and osteoarthritis.  Patient states that she notices increased swelling in her bilateral hands.  She states that she also has intermittent right fifth trigger finger.  She has difficulty making a fist in the morning.  She states that she does not have any joint pain but she has swelling.  She also feels some discomfort in the tendons of her wrist.  She has intermittent discomfort in her right elbow which she describes over right lateral epicondylar region.  She does not have any active psoriasis lesions.  Activities of Daily Living:  Patient reports morning stiffness for a few minutes.   Patient Reports nocturnal pain.  Difficulty dressing/grooming: Denies Difficulty climbing stairs: Denies Difficulty getting out of chair: Denies Difficulty using hands for taps, buttons, cutlery, and/or writing: Denies  Review of Systems  Constitutional:  Negative for fatigue.  HENT:  Negative for mouth sores, mouth dryness and nose dryness.   Eyes:  Negative for pain, itching and dryness.  Respiratory:  Negative for shortness of breath and difficulty breathing.   Cardiovascular:  Negative for chest pain and palpitations.  Gastrointestinal:  Negative for blood in stool, constipation and diarrhea.  Endocrine: Negative for increased urination.  Genitourinary:  Negative for difficulty urinating.  Musculoskeletal:  Positive for morning stiffness. Negative for joint pain, joint pain, joint swelling, myalgias, muscle tenderness and myalgias.  Skin:  Negative for color change, rash and redness.  Allergic/Immunologic: Negative for susceptible to infections.  Neurological:   Positive for numbness. Negative for dizziness, headaches, memory loss and weakness.  Hematological:  Negative for bruising/bleeding tendency.  Psychiatric/Behavioral:  Negative for confusion.    PMFS History:  Patient Active Problem List   Diagnosis Date Noted   Psoriasis 05/05/2021   B12 deficiency 06/09/2020   Genetic testing 08/10/2019   Family history of prostate cancer in father    Malignant neoplasm of upper-outer quadrant of left breast in female, estrogen receptor positive ([place]) 07/30/2019    Past Medical History:  Diagnosis Date   Arthritis    per patient new onset in finger joints   BV (bacterial vaginosis) 2012   Cancer ([place]) 2021   left breast    Cyst of ovary, right 2003   Family history of prostate cancer in father    H/O rubella    H/O varicella as child   History of PCOS 06/2005   Hypertension    Infertility, female    Irregular menses 11/2001   Migraine    Pregnancy induced hypertension    Psoriasis    Psychosocial stressors 10/2009    Family History  Problem Relation Age of Onset   Heart disease Mother    Hypertension Mother    Heart disease Father    Depression Father    Alcohol abuse Father    Prostate cancer Father 61   Hypertension Sister    Hypercholesterolemia Sister    Heart disease Maternal Grandmother    Heart disease Maternal Grandfather    Healthy Son    Healthy Son  Past Surgical History:  Procedure Laterality Date   BREAST ENHANCEMENT SURGERY Bilateral 2013   BREAST LUMPECTOMY WITH RADIOACTIVE SEED AND SENTINEL LYMPH NODE BIOPSY Left 08/21/2019   Procedure: LEFT BREAST LUMPECTOMY WITH RADIOACTIVE SEED AND LEFT AXILLARY SENTINEL LYMPH NODE BIOPSY;  Surgeon: [name], MD;  Location: [place];  Service: General;  Laterality: Left;   BREAST SURGERY     CESAREAN SECTION     x 1    RE-EXCISION OF BREAST LUMPECTOMY Left 09/11/2019   Procedure: LEFT RE-EXCISION OF BREAST MARGIN;  Surgeon: [name], MD;   Location: [place];  Service: General;  Laterality: Left;   ROBOTIC ASSISTED BILATERAL SALPINGO OOPHERECTOMY Bilateral 03/20/2020   Procedure: XI ROBOTIC ASSISTED BILATERAL SALPINGO OOPHORECTOMY;  Surgeon: [name], MD;  Location: [place];  Service: Gynecology;  Laterality: Bilateral;  Dr. [name] requesting 2 / 2 1/2 hours   Social History   Social History Narrative   Not on file   Immunization History  Administered Date(s) Administered   Influenza,inj,Quad PF,6+ Mos 03/18/2018   PFIZER(Purple Top)SARS-COV-2 Vaccination 10/05/2019, 10/31/2019     Objective: Vital Signs: BP 113/70 (BP Location: Right Arm, Patient Position: Sitting, Cuff Size: Normal)    Pulse 78    Ht 5\' 5"  (1.651 m)    Wt 147 lb (66.7 kg)    BMI 24.46 kg/m    Physical Exam Vitals and nursing note reviewed.  Constitutional:      Appearance: She is well-developed.  HENT:     Head: Normocephalic and atraumatic.  Eyes:     Conjunctiva/sclera: Conjunctivae normal.  Cardiovascular:     Rate and Rhythm: Normal rate and regular rhythm.     Heart sounds: Normal heart sounds.  Pulmonary:     Effort: Pulmonary effort is normal.     Breath sounds: Normal breath sounds.  Abdominal:     General: Bowel sounds are normal.     Palpations: Abdomen is soft.  Musculoskeletal:     Cervical back: Normal range of motion.  Lymphadenopathy:     Cervical: No cervical adenopathy.  Skin:    General: Skin is warm and dry.     Capillary Refill: Capillary refill takes less than 2 seconds.  Neurological:     Mental Status: She is alert and oriented to person, place, and time.  Psychiatric:        Behavior: Behavior normal.     Musculoskeletal Exam: C-spine was in good range of motion.  Shoulder joints, elbow joints, wrist joints, MCPs PIPs and DIPs with good range of motion.  She had bilateral PIP and DIP thickening with no synovitis.  Hip joints and knee joints with good range of motion.   There was no tenderness over ankles or MTPs.  CDAI Exam: CDAI Score: -- Patient Global: --; Provider Global: -- Swollen: --; Tender: -- Joint Exam 06/15/2021   No joint exam has been documented for this visit   There is currently no information documented on the homunculus. Go to the Rheumatology activity and complete the homunculus joint exam.  Investigation: No additional findings.  Imaging: No results found.  Recent Labs: Lab Results  Component Value Date   WBC 3.4 (L) 03/24/2021   HGB 12.8 03/24/2021   PLT 220 03/24/2021   NA 140 03/24/2021   K 3.8 03/24/2021   CL 105 03/24/2021   CO2 25 03/24/2021   GLUCOSE 87 03/24/2021   BUN 10 03/24/2021   CREATININE 0.83 03/24/2021  BILITOT 0.7 03/24/2021   ALKPHOS 78 03/24/2021   AST 16 03/24/2021   ALT 13 03/24/2021   PROT 7.2 03/24/2021   ALBUMIN 4.3 03/24/2021   CALCIUM 9.8 03/24/2021   GFRAA >60 03/14/2020    Labs [facility] at [facility] February 03, 2021 ANA negative,C3-C4 normal, PTH normal, TSH normal, calcium normal, albumin normal.  Speciality Comments:       Procedures:  No procedures performed Allergies: Patient has no known allergies.   Assessment / Plan:     Visit Diagnoses: Psoriasis -she was diagnosed by a dermatologist in the past.  She had a few patches on her abdomen which respond to the topical agents.  Pain in both hands -she was prescribed Otezla by Dr. Amil Amen in the past which she did not start.  She gives history of intermittent swelling in her hands.  She had PIP and DIP thickening but I did not appreciate any synovitis.  The clinical findings are consistent with osteoarthritis.  Detailed counsel regarding osteoarthritis was provided.  An ultrasound is a schedule in February 2023.  Trigger little finger of right hand-she gives history of intermittent triggering of her right fifth finger.  We had detailed discussion regarding trigger finger.  No tenosynovitis was noted.  I advised her use of topical  diclofenac gel and splinting of her finger in case her symptoms get worse.  Pain in both feet - She gives history of intermittent swelling.  No synovitis was noted.  No history of Achilles tendinitis or plantar fasciitis.   Essential hypertension-her blood pressure was normal today.  Malignant neoplasm of upper-outer quadrant of left breast in female, estrogen receptor positive ([place]) - Diagnosed in January 2021.  Status postlumpectomy and radiation therapy.  She is on exemestane  Vitamin B12 deficiency  Vitamin D deficiency  History of PCOS  Orders: No orders of the defined types were placed in this encounter.  No orders of the defined types were placed in this encounter.  Total time spent  was 30 minutes.  Greater than 50% of time was spent in counseling and coordination of care.  Follow-Up Instructions: Return in about 3 months (around 09/13/2021) for Osteoarthritis, Ps.   Bo Merino, MD  Note - This record has been created using Editor, commissioning.  Chart creation errors have been sought, but may not always  have been located. Such creation errors do not reflect on  the standard of medical care.

## 2021-06-15 ENCOUNTER — Ambulatory Visit: Payer: BC Managed Care – PPO | Admitting: Rheumatology

## 2021-06-15 ENCOUNTER — Encounter: Payer: Self-pay | Admitting: Rheumatology

## 2021-06-15 ENCOUNTER — Other Ambulatory Visit: Payer: Self-pay

## 2021-06-15 VITALS — BP 113/70 | HR 78 | Ht 65.0 in | Wt 147.0 lb

## 2021-06-15 DIAGNOSIS — M79641 Pain in right hand: Secondary | ICD-10-CM

## 2021-06-15 DIAGNOSIS — Z8742 Personal history of other diseases of the female genital tract: Secondary | ICD-10-CM

## 2021-06-15 DIAGNOSIS — M79672 Pain in left foot: Secondary | ICD-10-CM

## 2021-06-15 DIAGNOSIS — M65351 Trigger finger, right little finger: Secondary | ICD-10-CM

## 2021-06-15 DIAGNOSIS — E559 Vitamin D deficiency, unspecified: Secondary | ICD-10-CM

## 2021-06-15 DIAGNOSIS — M79671 Pain in right foot: Secondary | ICD-10-CM | POA: Diagnosis not present

## 2021-06-15 DIAGNOSIS — Z170 Estrogen receptor positive status [ER+]: Secondary | ICD-10-CM

## 2021-06-15 DIAGNOSIS — M79642 Pain in left hand: Secondary | ICD-10-CM

## 2021-06-15 DIAGNOSIS — L409 Psoriasis, unspecified: Secondary | ICD-10-CM

## 2021-06-15 DIAGNOSIS — E538 Deficiency of other specified B group vitamins: Secondary | ICD-10-CM

## 2021-06-15 DIAGNOSIS — I10 Essential (primary) hypertension: Secondary | ICD-10-CM

## 2021-06-15 DIAGNOSIS — C50412 Malignant neoplasm of upper-outer quadrant of left female breast: Secondary | ICD-10-CM

## 2021-07-06 ENCOUNTER — Ambulatory Visit: Payer: BC Managed Care – PPO | Attending: Radiation Oncology

## 2021-07-06 ENCOUNTER — Other Ambulatory Visit: Payer: Self-pay

## 2021-07-06 VITALS — Wt 148.0 lb

## 2021-07-06 DIAGNOSIS — Z483 Aftercare following surgery for neoplasm: Secondary | ICD-10-CM | POA: Insufficient documentation

## 2021-07-06 NOTE — Therapy (Signed)
[facility] @ [facility] [place], [place], [address] Phone: [phone]   Fax:  [phone]  Physical Therapy Treatment  Patient Details  Name: Robin Castillo MRN: [id number] Date of Birth: Nov 07, 1970 Referring Provider (PT): Dr. Donne Hazel   Encounter Date: 07/06/2021   PT End of Session - 07/06/21 1050     Visit Number 2   # unchanged due to screen only   PT Start Time 1046    PT Stop Time 1052    PT Time Calculation (min) 6 min    Activity Tolerance Patient tolerated treatment well    Behavior During Therapy [facility] for tasks assessed/performed             Past Medical History:  Diagnosis Date   Arthritis    per patient new onset in finger joints   BV (bacterial vaginosis) 2012   Cancer ([place]) 2021   left breast    Cyst of ovary, right 2003   Family history of prostate cancer in father    H/O rubella    H/O varicella as child   History of PCOS 06/2005   Hypertension    Infertility, female    Irregular menses 11/2001   Migraine    Pregnancy induced hypertension    Psoriasis    Psychosocial stressors 10/2009    Past Surgical History:  Procedure Laterality Date   BREAST ENHANCEMENT SURGERY Bilateral 2013   BREAST LUMPECTOMY WITH RADIOACTIVE SEED AND SENTINEL LYMPH NODE BIOPSY Left 08/21/2019   Procedure: LEFT BREAST LUMPECTOMY WITH RADIOACTIVE SEED AND LEFT AXILLARY SENTINEL LYMPH NODE BIOPSY;  Surgeon: [name], MD;  Location: [place];  Service: General;  Laterality: Left;   BREAST SURGERY     CESAREAN SECTION     x 1    RE-EXCISION OF BREAST LUMPECTOMY Left 09/11/2019   Procedure: LEFT RE-EXCISION OF BREAST MARGIN;  Surgeon: [name], MD;  Location: [place];  Service: General;  Laterality: Left;   ROBOTIC ASSISTED BILATERAL SALPINGO OOPHERECTOMY Bilateral 03/20/2020   Procedure: XI ROBOTIC ASSISTED BILATERAL SALPINGO OOPHORECTOMY;  Surgeon: [name],  MD;  Location: [place] [facility];  Service: Gynecology;  Laterality: Bilateral;  Dr. [name] requesting 2 / 2 1/2 hours    Vitals:   07/06/21 1049  Weight: 148 lb (67.1 kg)     Subjective Assessment - 07/06/21 1049     Subjective Pt returns for her 3 month L-Dex screen.    Pertinent History Patient was diagnosed on 07/25/2019 with left grade II invasive ductal carcinoma breast cancer. Patient reports she underwent a left lumpectomy and sentinel node biopsy (1 node removed with micromets) on 08/21/2019. It is ER/PR positive and HER2 negative with a Ki67 of 2%.She had 20 radiation treatments with booster past histroy of breat implants in 2013, possible psoriatic arthritis and possible carpal tunnel on the right                    L-DEX FLOWSHEETS - 07/06/21 1000       L-DEX LYMPHEDEMA SCREENING   Measurement Type Unilateral    L-DEX MEASUREMENT EXTREMITY Upper Extremity    POSITION  Standing    DOMINANT SIDE Right    At Risk Side Left    BASELINE SCORE (UNILATERAL) -0.8    L-DEX SCORE (UNILATERAL) -5.1    VALUE CHANGE (UNILAT) -4.3  PT Long Term Goals - 08/01/20 1307       PT LONG TERM GOAL #1   Title Patient will be independent with performing self manual techniques and stretching to decrease axillary cording    Time 4    Status New      PT LONG TERM GOAL #2   Title Patient will report >/= 50% less evidence of axiallary cording per her self assessment    Period Weeks    Status New      PT LONG TERM GOAL #3   Title Pt will decreasae Quick DASH score to < 10 demonstrating an improvement in function of her left arm    Baseline 13.64 on 08/01/2020    Time 4    Period Weeks    Status New                   Plan - 07/06/21 1051     Clinical Impression Statement Pt returns for her 3 month L-Dex screen. Her change from bsaeline of -4.3 is WNLs so no further treatment is required at this  time except to cont her L-Dex screens. Pt is near 2 years so she can now do 6 month visit for next year. Pt is agreeable to this.    PT Next Visit Plan Cont 6 months month L-Dex screens for up to 1 year as she is now 2 years out from her SLNB. (~08/20/2021)    Consulted and Agree with Plan of Care Patient             Patient will benefit from skilled therapeutic intervention in order to improve the following deficits and impairments:     Visit Diagnosis: Aftercare following surgery for neoplasm     Problem List Patient Active Problem List   Diagnosis Date Noted   Psoriasis 05/05/2021   B12 deficiency 06/09/2020   Genetic testing 08/10/2019   Family history of prostate cancer in father    Malignant neoplasm of upper-outer quadrant of left breast in female, estrogen receptor positive ([place]) 07/30/2019    [name], PTA 07/06/2021, 10:56 AM  [facility] @ [facility] [address] [place], [place], [address] Phone: [phone]   Fax:  [phone]  Name: Marlean Mortell MRN: [id number] Date of Birth: 01/01/1971

## 2021-07-09 ENCOUNTER — Inpatient Hospital Stay: Payer: BC Managed Care – PPO | Admitting: Hematology and Oncology

## 2021-07-09 ENCOUNTER — Inpatient Hospital Stay: Payer: BC Managed Care – PPO | Attending: Oncology

## 2021-07-09 ENCOUNTER — Other Ambulatory Visit: Payer: Self-pay

## 2021-07-09 DIAGNOSIS — C50412 Malignant neoplasm of upper-outer quadrant of left female breast: Secondary | ICD-10-CM

## 2021-07-09 DIAGNOSIS — Z170 Estrogen receptor positive status [ER+]: Secondary | ICD-10-CM | POA: Diagnosis not present

## 2021-07-09 DIAGNOSIS — Z8349 Family history of other endocrine, nutritional and metabolic diseases: Secondary | ICD-10-CM | POA: Diagnosis not present

## 2021-07-09 DIAGNOSIS — Z79899 Other long term (current) drug therapy: Secondary | ICD-10-CM | POA: Insufficient documentation

## 2021-07-09 DIAGNOSIS — Z8042 Family history of malignant neoplasm of prostate: Secondary | ICD-10-CM | POA: Insufficient documentation

## 2021-07-09 DIAGNOSIS — Z8249 Family history of ischemic heart disease and other diseases of the circulatory system: Secondary | ICD-10-CM | POA: Insufficient documentation

## 2021-07-09 DIAGNOSIS — Z811 Family history of alcohol abuse and dependence: Secondary | ICD-10-CM | POA: Insufficient documentation

## 2021-07-09 DIAGNOSIS — H9201 Otalgia, right ear: Secondary | ICD-10-CM | POA: Insufficient documentation

## 2021-07-09 DIAGNOSIS — Z818 Family history of other mental and behavioral disorders: Secondary | ICD-10-CM | POA: Diagnosis not present

## 2021-07-09 DIAGNOSIS — R590 Localized enlarged lymph nodes: Secondary | ICD-10-CM | POA: Diagnosis not present

## 2021-07-09 LAB — COMPREHENSIVE METABOLIC PANEL
ALT: 14 U/L (ref 0–44)
AST: 16 U/L (ref 15–41)
Albumin: 4.6 g/dL (ref 3.5–5.0)
Alkaline Phosphatase: 77 U/L (ref 38–126)
Anion gap: 6 (ref 5–15)
BUN: 6 mg/dL (ref 6–20)
CO2: 31 mmol/L (ref 22–32)
Calcium: 10 mg/dL (ref 8.9–10.3)
Chloride: 102 mmol/L (ref 98–111)
Creatinine, Ser: 0.73 mg/dL (ref 0.44–1.00)
GFR, Estimated: >60 mL/min (ref >60)
Glucose, Bld: 80 mg/dL (ref 70–99)
Potassium: 3.8 mmol/L (ref 3.5–5.1)
Sodium: 139 mmol/L (ref 135–145)
Total Bilirubin: 0.6 mg/dL (ref 0.3–1.2)
Total Protein: 7.3 g/dL (ref 6.5–8.1)

## 2021-07-09 LAB — CBC WITH DIFFERENTIAL/PLATELET
Abs Immature Granulocytes: 0.01 10*3/uL (ref 0.00–0.07)
Basophils Absolute: 0 10*3/uL (ref 0.0–0.1)
Basophils Relative: 1 %
Eosinophils Absolute: 0.1 10*3/uL (ref 0.0–0.5)
Eosinophils Relative: 2 %
HCT: 38.6 % (ref 36.0–46.0)
Hemoglobin: 12.6 g/dL (ref 12.0–15.0)
Immature Granulocytes: 0 %
Lymphocytes Relative: 40 %
Lymphs Abs: 1.3 10*3/uL (ref 0.7–4.0)
MCH: 29.2 pg (ref 26.0–34.0)
MCHC: 32.6 g/dL (ref 30.0–36.0)
MCV: 89.4 fL (ref 80.0–100.0)
Monocytes Absolute: 0.2 10*3/uL (ref 0.1–1.0)
Monocytes Relative: 6 %
Neutro Abs: 1.7 10*3/uL (ref 1.7–7.7)
Neutrophils Relative %: 51 %
Platelets: 230 10*3/uL (ref 150–400)
RBC: 4.32 MIL/uL (ref 3.87–5.11)
RDW: 12.8 % (ref 11.5–15.5)
WBC: 3.3 10*3/uL — ABNORMAL LOW (ref 4.0–10.5)
nRBC: 0 % (ref 0.0–0.2)

## 2021-07-09 MED ORDER — GABAPENTIN 300 MG PO CAPS: 300.0000 mg | ORAL_CAPSULE | Freq: Every evening | ORAL | 3 refills | Status: DC | PRN

## 2021-07-09 MED ORDER — SMART Q10 COQ10 100-300 MG-UNIT PO CHEW: 300.0000 mg | CHEWABLE_TABLET | Freq: Every day | ORAL | Status: DC

## 2021-07-09 MED ORDER — ERGOCALCIFEROL 1.25 MG (50000 UT) PO CAPS: 50000.0000 [IU] | ORAL_CAPSULE | ORAL | Status: DC

## 2021-07-09 NOTE — Progress Notes (Signed)
Patient Care Team: [name], MD as PCP - General (Internal Medicine) Kyung Rudd, MD as Consulting Physician (Radiation Oncology) [name], MD as Consulting Physician (General Surgery) Belva Crome, MD as Consulting Physician (Cardiology) Charlotte Crumb, MD as Consulting Physician (Orthopedic Surgery)  DIAGNOSIS:  Encounter Diagnosis  Name Primary?   Malignant neoplasm of upper-outer quadrant of left breast in female, estrogen receptor positive ([place])     SUMMARY OF ONCOLOGIC HISTORY: Oncology History  Malignant neoplasm of upper-outer quadrant of left breast in female, estrogen receptor positive ([place])  08/09/2019 Genetic Testing   Negative genetic testing:  No pathogenic variants detected on the [facility] Breast Cancer STAT or Common Hereditary Cancers panel. A variant of uncertain significance was detected in the MSH3 gene called c.803G>A (p.Arg268Gln). The report date is 08/09/2019.  The STAT Breast cancer panel offered by [facility] includes sequencing and rearrangement analysis for the following 9 genes:  ATM, BRCA1, BRCA2, CDH1, CHEK2, PALB2, PTEN, STK11 and TP53.  The Common Hereditary Cancers Panel offered by [facility] includes sequencing and/or deletion duplication testing of the following 48 genes: APC, ATM, AXIN2, BARD1, BMPR1A, BRCA1, BRCA2, BRIP1, CDH1, CDK4, CDKN2A (p14ARF), CDKN2A (p16INK4a), CHEK2, CTNNA1, DICER1, EPCAM (Deletion/duplication testing only), GREM1 (promoter region deletion/duplication testing only), KIT, MEN1, MLH1, MSH2, MSH3, MSH6, MUTYH, NBN, NF1, NHTL1, PALB2, PDGFRA, PMS2, POLD1, POLE, PTEN, RAD50, RAD51C, RAD51D, RNF43, SDHB, SDHC, SDHD, SMAD4, SMARCA4. STK11, TP53, TSC1, TSC2, and VHL.  The following genes were evaluated for sequence changes only: SDHA and HOXB13 c.251G>A variant only.    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 (a) a single sentinel lymph node was removed (b) additional surgery 09/11/2019 cleared the margins   08/21/2019 Cancer Staging   Staging form: Breast, AJCC 8th Edition - Pathologic stage from 08/21/2019: Stage IA (pT1c, pN18m, cM0, G2, ER+, PR+, HER2-) - Signed by [name] on 09/05/2019    07/2019 Oncotype testing   Oncotype score of 16 predicts a risk of recurrence outside the breast in the next 9 years of 15% if the patient's only systemic therapy is tamoxifen for 5 years   10/12/2019 -  Anti-estrogen oral therapy   (a) goserelin/Zoladex started 10/12/2019, discontinued after 02/29/2020 dose (b) anastrozole started 11/27/2019, discontinued May 2022 with multiple side effects (c) bilateral salpingo-oophorectomy 03/20/2020 (d) bone density 05/19/2020 shows a T score of -0.1 (normal). (e) exemestane started July 2022   10/17/2019 - 11/13/2019 Radiation Therapy   adjuvant radiation      CHIEF COMPLIANT: Follow-up on exemestane  INTERVAL HISTORY: Robin Illingworthis a patient who followed with Dr. MJana Hakimpreviously and who is changing providers to me.  She has a history of left breast cancer treated with lumpectomy and radiation and is currently on antiestrogen therapy with exemestane.  She had previously undergone bilateral salpingo-oophorectomy.  She appears to be tolerating exemestane reasonably well.  Denies any lumps or nodules in the breast.   ALLERGIES:  has No Known Allergies.  MEDICATIONS:  Current Outpatient Medications  Medication Sig Dispense Refill   Coenzyme Q10-Vitamin E (SMART Q10 COQ10) 100-300 MG-UNIT CHEW Chew 300 mg by mouth daily.     ergocalciferol (VITAMIN D2) 1.25 MG (50000 UT) capsule Take 1 capsule (50,000 Units total) by mouth once a week.     Azilsartan-Chlorthalidone (EDARBYCLOR) 40-12.5 MG TABS Take by mouth. 30 tablet    exemestane (AROMASIN)  25 MG tablet Take 1 tablet (25 mg total) by mouth daily  after breakfast. 90 tablet 4   fluocinolone (VANOS) 0.01 % cream Apply topically as needed.     gabapentin (NEURONTIN) 300 MG capsule Take 1 capsule (300 mg total) by mouth at bedtime as needed. 90 capsule 3   lisinopril-hydrochlorothiazide (ZESTORETIC) 10-12.5 MG tablet Take 1 tablet by mouth daily. (Patient not taking: Reported on 05/05/2021)     NALTREXONE HCL PO Take 3 mg by mouth daily.     Omega-3 Fatty Acids (FISH OIL PO) Take by mouth daily.     Turmeric 500 MG TABS PATIENT TAKES IN DIET     vitamin B-12 (CYANOCOBALAMIN) 100 MCG tablet Take 1 tablet (100 mcg total) by mouth daily.     No current facility-administered medications for this visit.    PHYSICAL EXAMINATION: ECOG PERFORMANCE STATUS: 1 - Symptomatic but completely ambulatory  Vitals:   07/09/21 1000  BP: 118/71  Pulse: 70  Temp: 97.7 F (36.5 C)  SpO2: 100%   Filed Weights   07/09/21 1000  Weight: 147 lb 4.8 oz (66.8 kg)      LABORATORY DATA:  I have reviewed the data as listed CMP Latest Ref Rng & Units 07/09/2021 03/24/2021 11/18/2020  Glucose 70 - 99 mg/dL 80 87 91  BUN 6 - 20 mg/dL _0 Creatinine 0.44 - 1.00 mg/dL 0.73 0.83 0.81  Sodium 135 - 145 mmol/L 139 140 141  Potassium 3.5 - 5.1 mmol/L 3.8 3.8 3.5  Chloride 98 - 111 mmol/L 102 105 103  CO2 22 - 32 mmol/L _1 Calcium 8.9 - 10.3 mg/dL 10.0 9.8 10.0  Total Protein 6.5 - 8.1 g/dL 7.3 7.2 7.6  Total Bilirubin 0.3 - 1.2 mg/dL 0.6 0.7 0.7  Alkaline Phos 38 - 126 U/L 77 78 85  AST 15 - 41 U/L _2 ALT 0 - 44 U/L _3 Lab Results  Component Value Date   WBC 3.3 (L) 07/09/2021   HGB 12.6 07/09/2021   HCT 38.6 07/09/2021   MCV 89.4 07/09/2021   PLT 230 07/09/2021   NEUTROABS 1.7 07/09/2021    ASSESSMENT & PLAN:  Malignant neoplasm of upper-outer quadrant of left breast in female, estrogen receptor positive ([place])  left lumpectomy and sentinel lymph node sampling 08/21/2019 for a pT1c pN1(mic), stage IA invasive ductal  carcinoma grade 2, with a positive anterior/inferior margin.  0/1 lymph node negative, additional surgery 09/11/2019.  The margins Oncotype DX score 16 (risk of recurrence at 9 years: 15%) Adjuvant radiation 10/17/2019 11/13/2019  Current treatment: Exemestane (10/12/2019: Goserelin: Discontinued, anastrozole started 11/27/2019 discontinued 11/14/2020, BSO: 03/20/2020, exemestane started July 2022)  Exemestane toxicities: Tolerating the treatment extremely well.  Breast cancer surveillance: 1.  Breast exam 07/09/2021: Benign 2. mammogram to be done next week  Return to clinic in 1 year for follow-up  No orders of the defined types were placed in this encounter.  The patient has a good understanding of the overall plan. she agrees with it. she will call with any problems that may develop before the next visit here. Total time spent: 90 mins including face to face time and time spent for planning, charting and co-ordination of care   Harriette Ohara, MD 07/09/21

## 2021-07-09 NOTE — Assessment & Plan Note (Signed)
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  0/1 lymph node negative, additional surgery 09/11/2019.  The margins Oncotype DX score 16 (risk of recurrence at 9 years: 15%) Adjuvant radiation 10/17/2019 11/13/2019  Current treatment: Exemestane (10/12/2019: Goserelin: Discontinued, anastrozole started 11/27/2019 discontinued 11/14/2020, BSO: 03/20/2020, exemestane started July 2022)  Exemestane toxicities:  Breast cancer surveillance: 1.  Breast exam 07/09/2021: Benign 2. mammogram

## 2021-07-15 ENCOUNTER — Telehealth: Payer: Self-pay | Admitting: *Deleted

## 2021-07-15 ENCOUNTER — Inpatient Hospital Stay: Payer: BC Managed Care – PPO | Admitting: Adult Health

## 2021-07-15 NOTE — Telephone Encounter (Signed)
Received call from pt with complaint of left underarm pain and breast tightness.  Pt states she can fee a new lump and requesting to be seen by a provider for a breast exam.  Appt scheduled, pt verbalized understanding of appt date and time.

## 2021-07-16 ENCOUNTER — Other Ambulatory Visit: Payer: Self-pay | Admitting: Adult Health

## 2021-07-16 ENCOUNTER — Other Ambulatory Visit: Payer: Self-pay

## 2021-07-16 ENCOUNTER — Encounter: Payer: Self-pay | Admitting: Adult Health

## 2021-07-16 ENCOUNTER — Inpatient Hospital Stay (HOSPITAL_BASED_OUTPATIENT_CLINIC_OR_DEPARTMENT_OTHER): Payer: BC Managed Care – PPO | Admitting: Adult Health

## 2021-07-16 VITALS — BP 122/87 | HR 74 | Temp 97.9°F | Resp 18 | Ht 65.0 in | Wt 144.8 lb

## 2021-07-16 DIAGNOSIS — C50412 Malignant neoplasm of upper-outer quadrant of left female breast: Secondary | ICD-10-CM

## 2021-07-16 DIAGNOSIS — Z170 Estrogen receptor positive status [ER+]: Secondary | ICD-10-CM

## 2021-07-16 MED ORDER — LORAZEPAM 0.5 MG PO TABS
0.5000 mg | ORAL_TABLET | Freq: Every evening | ORAL | 0 refills | Status: DC | PRN
Start: 2021-07-16 — End: 2022-01-04

## 2021-07-16 NOTE — Progress Notes (Signed)
Robin Castillo Follow up:    [name], MD [address] [place] [address]   DIAGNOSIS:  Castillo Staging  Malignant neoplasm of upper-outer quadrant of left breast in female, estrogen receptor positive ([name]) Staging form: Breast, AJCC 8th Edition - Clinical stage from 08/01/2019: Stage IA (cT1b, cN0, cM0, G2, ER+, PR+, HER2-) - Unsigned Stage prefix: Initial diagnosis Histologic grading system: 3 grade system - Pathologic stage from 08/21/2019: Stage IA (pT1c, pN33m, cM0, G2, ER+, PR+, HER2-) - Signed by [name] on 09/05/2019 Stage prefix: Initial diagnosis Histologic grading system: 3 grade system   SUMMARY OF ONCOLOGIC HISTORY: Oncology History  Malignant neoplasm of upper-outer quadrant of left breast in female, estrogen receptor positive ([name]  08/09/2019 Genetic Testing   Negative genetic testing:  No pathogenic variants detected on the [facility] Breast Castillo STAT or Common Hereditary Cancers panel. A variant of uncertain significance was detected in the MSH3 gene called c.803G>A (p.Arg268Gln). The report date is 08/09/2019.  The STAT Breast Castillo panel offered by [facility] includes sequencing and rearrangement analysis for the following 9 genes:  ATM, BRCA1, BRCA2, CDH1, CHEK2, PALB2, PTEN, STK11 and TP53.  The Common Hereditary Cancers Panel offered by [facility] includes sequencing and/or deletion duplication testing of the following 48 genes: APC, ATM, AXIN2, BARD1, BMPR1A, BRCA1, BRCA2, BRIP1, CDH1, CDK4, CDKN2A (p14ARF), CDKN2A (p16INK4a), CHEK2, CTNNA1, DICER1, EPCAM (Deletion/duplication testing only), GREM1 (promoter region deletion/duplication testing only), KIT, MEN1, MLH1, MSH2, MSH3, MSH6, MUTYH, NBN, NF1, NHTL1, PALB2, PDGFRA, PMS2, POLD1, POLE, PTEN, RAD50, RAD51C, RAD51D, RNF43, SDHB, SDHC, SDHD, SMAD4, SMARCA4. STK11, TP53, TSC1, TSC2, and VHL.  The following genes were evaluated for sequence changes only: SDHA and HOXB13 c.251G>A  variant only.    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 (a) a single sentinel lymph node was removed (b) additional surgery 09/11/2019 cleared the margins   08/21/2019 Castillo Staging   Staging form: Breast, AJCC 8th Edition - Pathologic stage from 08/21/2019: Stage IA (pT1c, pN131m cM0, G2, ER+, PR+, HER2-) - Signed by CaGardenia PhlegmNP on 09/05/2019    07/2019 Oncotype testing   Oncotype score of 16 predicts a risk of recurrence outside the breast in the next 9 years of 15% if the patient's only systemic therapy is tamoxifen for 5 years   10/12/2019 -  Anti-estrogen oral therapy   (a) goserelin/Zoladex started 10/12/2019, discontinued after 02/29/2020 dose (b) anastrozole started 11/27/2019, discontinued May 2022 with multiple side effects (c) bilateral salpingo-oophorectomy 03/20/2020 (d) bone density 05/19/2020 shows a T score of -0.1 (normal). (e) exemestane started July 2022   10/17/2019 - 11/13/2019 Radiation Therapy   adjuvant radiation      CURRENT THERAPY: exemestane  INTERVAL HISTORY: Robin Conkle[age].o. female returns for evaluation of newly noted left axillary nodular area of concern. She notes that about 2 weeks ago she noticed some soreness in her left axillary area.  She started to stretch increasingly.  Then a couple days ago she noticed a nodular area pop up in her anterior axilla.  She is very worried about this.  She says that she has not slept in 2 days because of the intense anxiety and concern about this area.  Following the lymph node appearing in the left underarm she developed some soreness and pain behind her right ear and felt up there and noticed that  there was an enlarged lymph node behind her right ear.  She does have 2 young boys and she notes that they have been increasingly sick with viral  illnesses over the past 2 months.   Patient Active Problem List   Diagnosis Date Noted   Psoriasis 05/05/2021   B12 deficiency 06/09/2020   Genetic testing 08/10/2019   Family history of prostate Castillo in father    Malignant neoplasm of upper-outer quadrant of left breast in female, estrogen receptor positive ([name]) 07/30/2019    has No Known Allergies.  MEDICAL HISTORY: Past Medical History:  Diagnosis Date   Arthritis    per patient new onset in finger joints   BV (bacterial vaginosis) 2012   Castillo ([place]) 2021   left breast    Cyst of ovary, right 2003   Family history of prostate Castillo in father    H/O rubella    H/O varicella as child   History of PCOS 06/2005   Hypertension    Infertility, female    Irregular menses 11/2001   Migraine    Pregnancy induced hypertension    Psoriasis    Psychosocial stressors 10/2009    SURGICAL HISTORY: Past Surgical History:  Procedure Laterality Date   BREAST ENHANCEMENT SURGERY Bilateral 2013   BREAST LUMPECTOMY WITH RADIOACTIVE SEED AND SENTINEL LYMPH NODE BIOPSY Left 08/21/2019   Procedure: LEFT BREAST LUMPECTOMY WITH RADIOACTIVE SEED AND LEFT AXILLARY SENTINEL LYMPH NODE BIOPSY;  Surgeon: Robin Bookbinder, MD;  Location: [facility];  Service: General;  Laterality: Left;   BREAST SURGERY     CESAREAN SECTION     x 1    RE-EXCISION OF BREAST LUMPECTOMY Left 09/11/2019   Procedure: LEFT RE-EXCISION OF BREAST MARGIN;  Surgeon: Robin Bookbinder, MD;  Location: [facility];  Service: General;  Laterality: Left;   ROBOTIC ASSISTED BILATERAL SALPINGO OOPHERECTOMY Bilateral 03/20/2020   Procedure: XI ROBOTIC ASSISTED BILATERAL SALPINGO OOPHORECTOMY;  Surgeon: Robin Bern, MD;  Location: [facility];  Service: Gynecology;  Laterality: Bilateral;  Dr. [name] requesting 2 / 2 1/2 hours    SOCIAL HISTORY: Social History   Socioeconomic History   Marital status: Married    Spouse name:  Not on file   Number of children: Not on file   Years of education: Not on file   Highest education level: Not on file  Occupational History   Not on file  Tobacco Use   Smoking status: Never   Smokeless tobacco: Never  Vaping Use   Vaping Use: Never used  Substance and Sexual Activity   Alcohol use: Not Currently   Drug use: No   Sexual activity: Yes  Other Topics Concern   Not on file  Social History Narrative   Not on file   Social Determinants of Health   Financial Resource Strain: Not on file  Food Insecurity: Not on file  Transportation Needs: Not on file  Physical Activity: Not on file  Stress: Not on file  Social Connections: Not on file  Intimate Partner Violence: Not on file    FAMILY HISTORY: Family History  Problem Relation Age of Onset   Heart disease Mother    Hypertension Mother    Heart disease Father    Depression Father    Alcohol abuse Father    Prostate Castillo Father 6   Hypertension Sister    Hypercholesterolemia Sister    Heart disease Maternal Grandmother    Heart disease Maternal Grandfather  Healthy Son    Healthy Son     Review of Systems  Constitutional:  Negative for appetite change, chills, fatigue, fever and unexpected weight change.  HENT:   Negative for hearing loss, lump/mass, sore throat and trouble swallowing.   Eyes:  Negative for eye problems and icterus.  Respiratory:  Negative for chest tightness, cough and shortness of breath.   Cardiovascular:  Negative for chest pain, leg swelling and palpitations.  Gastrointestinal:  Negative for abdominal distention, abdominal pain, constipation, diarrhea, nausea and vomiting.  Endocrine: Negative for hot flashes.  Genitourinary:  Negative for difficulty urinating.   Musculoskeletal:  Negative for arthralgias.  Skin:  Negative for itching and rash.  Neurological:  Negative for dizziness, extremity weakness, headaches and numbness.  Hematological:  Negative for adenopathy. Does  not bruise/bleed easily.  Psychiatric/Behavioral:  Negative for depression. The patient is not nervous/anxious.      PHYSICAL EXAMINATION  ECOG PERFORMANCE STATUS: 1 - Symptomatic but completely ambulatory  Vitals:   07/16/21 1032  BP: 122/87  Pulse: 74  Resp: 18  Temp: 97.9 F (36.6 C)  SpO2: 100%    Physical Exam Constitutional:      Appearance: Normal appearance.  HENT:     Head: Normocephalic and atraumatic.  Neck:     Comments: Positive right postauricular slight lymphadenopathy of 0.75 cm Cardiovascular:     Rate and Rhythm: Normal rate and regular rhythm.     Pulses: Normal pulses.     Heart sounds: Normal heart sounds.  Pulmonary:     Effort: Pulmonary effort is normal.     Breath sounds: Normal breath sounds.  Musculoskeletal:     Cervical back: Neck supple.  Lymphadenopathy:     Upper Body:     Left upper body: Axillary adenopathy (Very small anterior nodule noted questionably a lymph node.  It is hard and asymmetric in about half a centimeter at most.) present.  Neurological:     Mental Status: She is alert.    LABORATORY DATA:  CBC    Component Value Date/Time   WBC 3.3 (L) 07/09/2021 0945   RBC 4.32 07/09/2021 0945   HGB 12.6 07/09/2021 0945   HGB 11.2 (L) 08/01/2019 1234   HCT 38.6 07/09/2021 0945   PLT 230 07/09/2021 0945   PLT 313 08/01/2019 1234   MCV 89.4 07/09/2021 0945   MCH 29.2 07/09/2021 0945   MCHC 32.6 07/09/2021 0945   RDW 12.8 07/09/2021 0945   LYMPHSABS 1.3 07/09/2021 0945   MONOABS 0.2 07/09/2021 0945   EOSABS 0.1 07/09/2021 0945   BASOSABS 0.0 07/09/2021 0945    CMP     Component Value Date/Time   NA 139 07/09/2021 0945   K 3.8 07/09/2021 0945   CL 102 07/09/2021 0945   CO2 31 07/09/2021 0945   GLUCOSE 80 07/09/2021 0945   BUN 6 07/09/2021 0945   CREATININE 0.73 07/09/2021 0945   CREATININE 0.77 08/01/2019 1234   CALCIUM 10.0 07/09/2021 0945   PROT 7.3 07/09/2021 0945   ALBUMIN 4.6 07/09/2021 0945   AST 16  07/09/2021 0945   AST 15 08/01/2019 1234   ALT 14 07/09/2021 0945   ALT 13 08/01/2019 1234   ALKPHOS 77 07/09/2021 0945   BILITOT 0.6 07/09/2021 0945   BILITOT 0.3 08/01/2019 1234   GFRNONAA >60 07/09/2021 0945   GFRNONAA >60 08/01/2019 1234   GFRAA >60 03/14/2020 1053   GFRAA >60 08/01/2019 1234     ASSESSMENT and THERAPY PLAN:  Malignant neoplasm of upper-outer quadrant of left breast in female, estrogen receptor positive ([place])  left lumpectomy and sentinel lymph node sampling 08/21/2019 for a pT1c pN1(mic), 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  0/1 lymph node negative, additional surgery 09/11/2019.  The margins Oncotype DX score 16 (risk of recurrence at 9 years: 15%) Adjuvant radiation 10/17/2019 11/13/2019  Current treatment: Exemestane (10/12/2019: Goserelin: Discontinued, anastrozole started 11/27/2019 discontinued 11/14/2020, BSO: 03/20/2020, exemestane started July 2022)  Exemestane toxicities: None  Robin Castillo is very concerned about her left axillary nodule.  I have ordered a left breast diagnostic mammogram and left breast and axillary ultrasound to further evaluate.  She also has significant anxiety.  I have sent in #20 of lorazepam nightly as needed to help with sleep.  She knows that this medication is just to get her to sleep a couple nights and go ahead and get the mammogram and ultrasound.  I gave Jaiyla a lot of reassurance today during her visit.  She is very fearful and tearful about the possibility of this being a breast Castillo recurrence.  We will continue to support her.  We did talk about the right postauricular lymph node.  We will do the mammogram and ultrasound first and if needed we will work-up that further.  She knows to call if anything worsens or changes.   Orders Placed This Encounter  Procedures   US BREAST LTD UNI LEFT INC AXILLA    INS: [employer] Epic ORDER- COSIGN REQ PF 04/03/14 _0 Janene Harvey LT BR & NODULE/NO  NEEDS/TD W [facility] @ [place] [phone]    Standing Status:   Future    Standing Expiration Date:   07/16/2022    Order Specific Question:   Reason for Exam (SYMPTOM  OR DIAGNOSIS REQUIRED)    Answer:   breast Castillo, left axillary lymphadenopathy, eval for recurrence    Order Specific Question:   Preferred imaging location?    Answer:   [facility]    All questions were answered. The patient knows to call the clinic with any problems, questions or concerns. We can certainly see the patient much sooner if necessary. This note was electronically signed.  Total encounter time: 30 minutes in face-to-face visit time, chart review, lab review, care coordination, and documentation of the encounter.  Wilber Bihari, NP 07/16/21 11:31 AM Medical Oncology and Hematology [facility] [place], [place] [address] Tel. [phone]    Fax. [phone]

## 2021-07-16 NOTE — Assessment & Plan Note (Signed)
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  0/1 lymph node negative, additional surgery 09/11/2019.  The margins Oncotype DX score 16 (risk of recurrence at 9 years: 15%) Adjuvant radiation 10/17/2019 11/13/2019  Current treatment: Exemestane (10/12/2019: Goserelin: Discontinued, anastrozole started 11/27/2019 discontinued 11/14/2020, BSO: 03/20/2020, exemestane started July 2022)  Exemestane toxicities: None  Robin Castillo is very concerned about her left axillary nodule.  I have ordered a left breast diagnostic mammogram and left breast and axillary ultrasound to further evaluate.  She also has significant anxiety.  I have sent in #20 of lorazepam nightly as needed to help with sleep.  She knows that this medication is just to get her to sleep a couple nights and go ahead and get the mammogram and ultrasound.  I gave Nadiah a lot of reassurance today during her visit.  She is very fearful and tearful about the possibility of this being a breast cancer recurrence.  We will continue to support her.  We did talk about the right postauricular lymph node.  We will do the mammogram and ultrasound first and if needed we will work-up that further.  She knows to call if anything worsens or changes.

## 2021-07-20 ENCOUNTER — Encounter: Payer: Self-pay | Admitting: Hematology and Oncology

## 2021-07-20 ENCOUNTER — Ambulatory Visit
Admission: RE | Admit: 2021-07-20 | Discharge: 2021-07-20 | Disposition: A | Discharge status: Home / Self Care | Payer: BC Managed Care – PPO | Source: Ambulatory Visit | Attending: Adult Health | Admitting: Adult Health

## 2021-07-20 ENCOUNTER — Ambulatory Visit: Payer: BC Managed Care – PPO

## 2021-07-20 DIAGNOSIS — Z170 Estrogen receptor positive status [ER+]: Secondary | ICD-10-CM

## 2021-07-20 DIAGNOSIS — C50412 Malignant neoplasm of upper-outer quadrant of left female breast: Secondary | ICD-10-CM

## 2021-07-28 ENCOUNTER — Other Ambulatory Visit: Payer: Self-pay | Admitting: Obstetrics and Gynecology

## 2021-07-28 DIAGNOSIS — Z9889 Other specified postprocedural states: Secondary | ICD-10-CM

## 2021-08-13 NOTE — Progress Notes (Deleted)
? ?Office Visit Note ? ?Patient: Robin Castillo             ?Date of Birth: 05/20/1971           ?MRN: [id number]             ?PCP: [name], MD ?Referring: [name], MD ?Visit Date: 08/26/2021 ?Occupation: @GUAROCC @ ? ?Subjective:  ?No chief complaint on file. ? ? ?History of Present Illness: Robin Castillo is a 51 y.o. female ***  ? ?Activities of Daily Living:  ?Patient reports morning stiffness for *** {minute/hour:19697}.   ?Patient {ACTIONS;DENIES/REPORTS:21021675::"Denies"} nocturnal pain.  ?Difficulty dressing/grooming: {ACTIONS;DENIES/REPORTS:21021675::"Denies"} ?Difficulty climbing stairs: {ACTIONS;DENIES/REPORTS:21021675::"Denies"} ?Difficulty getting out of chair: {ACTIONS;DENIES/REPORTS:21021675::"Denies"} ?Difficulty using hands for taps, buttons, cutlery, and/or writing: {ACTIONS;DENIES/REPORTS:21021675::"Denies"} ? ?No Rheumatology ROS completed.  ? ?PMFS History:  ?Patient Active Problem List  ? Diagnosis Date Noted  ? Psoriasis 05/05/2021  ? B12 deficiency 06/09/2020  ? Genetic testing 08/10/2019  ? Family history of prostate cancer in father   ? Malignant neoplasm of upper-outer quadrant of left breast in female, estrogen receptor positive ([place]) 07/30/2019  ?  ?Past Medical History:  ?Diagnosis Date  ? Arthritis   ? per patient new onset in finger joints  ? BV (bacterial vaginosis) 2012  ? Cancer [facility]) 2021  ? left breast   ? Cyst of ovary, right 2003  ? Family history of prostate cancer in father   ? H/O rubella   ? H/O varicella as child  ? History of PCOS 06/2005  ? Hypertension   ? Infertility, female   ? Irregular menses 11/2001  ? Migraine   ? Pregnancy induced hypertension   ? Psoriasis   ? Psychosocial stressors 10/2009  ?  ?Family History  ?Problem Relation Age of Onset  ? Heart disease Mother   ? Hypertension Mother   ? Heart disease Father   ? Depression Father   ? Alcohol abuse Father   ? Prostate cancer Father 45  ? Hypertension Sister   ? Hypercholesterolemia Sister   ? Heart  disease Maternal Grandmother   ? Heart disease Maternal Grandfather   ? Healthy Son   ? Healthy Son   ? ?Past Surgical History:  ?Procedure Laterality Date  ? BREAST ENHANCEMENT SURGERY Bilateral 2013  ? BREAST LUMPECTOMY WITH RADIOACTIVE SEED AND SENTINEL LYMPH NODE BIOPSY Left 08/21/2019  ? Procedure: LEFT BREAST LUMPECTOMY WITH RADIOACTIVE SEED AND LEFT AXILLARY SENTINEL LYMPH NODE BIOPSY;  Surgeon: [name], MD;  Location: [facility];  Service: General;  Laterality: Left;  ? BREAST SURGERY    ? CESAREAN SECTION    ? x 1   ? RE-EXCISION OF BREAST LUMPECTOMY Left 09/11/2019  ? Procedure: LEFT RE-EXCISION OF BREAST MARGIN;  Surgeon: [name], MD;  Location: [facility];  Service: General;  Laterality: Left;  ? ROBOTIC ASSISTED BILATERAL SALPINGO OOPHERECTOMY Bilateral 03/20/2020  ? Procedure: XI ROBOTIC ASSISTED BILATERAL SALPINGO OOPHORECTOMY;  Surgeon: [name], MD;  Location: [facility];  Service: Gynecology;  Laterality: Bilateral;  Dr. [name] requesting 2 / 2 1/2 hours  ? ?Social History  ? ?Social History Narrative  ? Not on file  ? ?Immunization History  ?Administered Date(s) Administered  ? Influenza,inj,Quad PF,6+ Mos 03/18/2018  ? PFIZER(Purple Top)SARS-COV-2 Vaccination 10/05/2019, 10/31/2019  ?  ? ?Objective: ?Vital Signs: There were no vitals taken for this visit.  ? ?Physical Exam  ? ?Musculoskeletal Exam: *** ? ?CDAI Exam: ?CDAI Score: -- ?Patient Global: --; Provider Global: -- ?  Swollen: --; Tender: -- ?Joint Exam 08/26/2021  ? ?No joint exam has been documented for this visit  ? ?There is currently no information documented on the homunculus. Go to the Rheumatology activity and complete the homunculus joint exam. ? ?Investigation: ?No additional findings. ? ?Imaging: ?No results found. ? ?Recent Labs: ?Lab Results  ?Component Value Date  ? WBC 3.3 (L) 07/09/2021  ? HGB 12.6 07/09/2021  ? PLT 230 07/09/2021  ? NA 139 07/09/2021  ? K  3.8 07/09/2021  ? CL 102 07/09/2021  ? CO2 31 07/09/2021  ? GLUCOSE 80 07/09/2021  ? BUN 6 07/09/2021  ? CREATININE 0.73 07/09/2021  ? BILITOT 0.6 07/09/2021  ? ALKPHOS 77 07/09/2021  ? AST 16 07/09/2021  ? ALT 14 07/09/2021  ? PROT 7.3 07/09/2021  ? ALBUMIN 4.6 07/09/2021  ? CALCIUM 10.0 07/09/2021  ? GFRAA >60 03/14/2020  ? ? ?Speciality Comments:  ? ? ? ? ? ?Procedures:  ?No procedures performed ?Allergies: Patient has no known allergies.  ? ?Assessment / Plan:     ?Visit Diagnoses: No diagnosis found. ? ?Orders: ?No orders of the defined types were placed in this encounter. ? ?No orders of the defined types were placed in this encounter. ? ? ?Face-to-face time spent with patient was *** minutes. Greater than 50% of time was spent in counseling and coordination of care. ? ?Follow-Up Instructions: No follow-ups on file. ? ? ?Earnestine Mealing, CMA ? ?Note - This record has been created using [employer].  ?Chart creation errors have been sought, but may not always  ?have been located. Such creation errors do not reflect on  ?the standard of medical care.  ?

## 2021-08-19 ENCOUNTER — Other Ambulatory Visit: Payer: BC Managed Care – PPO

## 2021-08-20 ENCOUNTER — Ambulatory Visit: Payer: Self-pay

## 2021-08-20 ENCOUNTER — Other Ambulatory Visit: Payer: Self-pay

## 2021-08-20 ENCOUNTER — Ambulatory Visit (INDEPENDENT_AMBULATORY_CARE_PROVIDER_SITE_OTHER): Payer: BC Managed Care – PPO | Admitting: Rheumatology

## 2021-08-20 DIAGNOSIS — M79642 Pain in left hand: Secondary | ICD-10-CM | POA: Diagnosis not present

## 2021-08-20 DIAGNOSIS — L409 Psoriasis, unspecified: Secondary | ICD-10-CM

## 2021-08-20 DIAGNOSIS — M79641 Pain in right hand: Secondary | ICD-10-CM

## 2021-08-20 DIAGNOSIS — M65351 Trigger finger, right little finger: Secondary | ICD-10-CM

## 2021-08-20 NOTE — Progress Notes (Signed)
Ultrasound of the bilateral hands was performed to look for synovitis or tenosynovitis.  Patient has history of psoriasis.  She has been experiencing pain and discomfort in her bilateral hands.  Ultrasound examination of bilateral hands was performed per EULAR recommendations. Using a 15 MHz transducer, grayscale and power Doppler bilateral second, third, and fifth finger PIP and DIP joints and bilateral wrist joints both dorsal and volar aspects were evaluated to look for synovitis or tenosynovitis. The findings were there was no synovitis or tenosynovitis on ultrasound examination. Right median nerve was 0.09 cm squares which was within normal limits and left median nerve was 0.10 cm squares which was within normal limits.  Impression: Ultrasound examination did not show synovitis or tenosynovitis.  Bilateral median nerves were within normal limits.  Ultrasound findings were discussed with the patient.  Bo Merino, MD

## 2021-08-26 ENCOUNTER — Ambulatory Visit: Payer: BC Managed Care – PPO | Admitting: Rheumatology

## 2021-08-26 DIAGNOSIS — I10 Essential (primary) hypertension: Secondary | ICD-10-CM

## 2021-08-26 DIAGNOSIS — C50412 Malignant neoplasm of upper-outer quadrant of left female breast: Secondary | ICD-10-CM

## 2021-08-26 DIAGNOSIS — E538 Deficiency of other specified B group vitamins: Secondary | ICD-10-CM

## 2021-08-26 DIAGNOSIS — M79641 Pain in right hand: Secondary | ICD-10-CM

## 2021-08-26 DIAGNOSIS — M65351 Trigger finger, right little finger: Secondary | ICD-10-CM

## 2021-08-26 DIAGNOSIS — E559 Vitamin D deficiency, unspecified: Secondary | ICD-10-CM

## 2021-08-26 DIAGNOSIS — M79671 Pain in right foot: Secondary | ICD-10-CM

## 2021-08-26 DIAGNOSIS — L409 Psoriasis, unspecified: Secondary | ICD-10-CM

## 2021-08-26 DIAGNOSIS — Z8742 Personal history of other diseases of the female genital tract: Secondary | ICD-10-CM

## 2021-10-07 ENCOUNTER — Other Ambulatory Visit: Payer: Self-pay | Admitting: *Deleted

## 2021-10-07 MED ORDER — EXEMESTANE 25 MG PO TABS: 25.0000 mg | ORAL_TABLET | Freq: Every day | ORAL | 0 refills | Status: DC

## 2021-10-10 IMAGING — US US PELVIS COMPLETE WITH TRANSVAGINAL
1 series · 13 of 25 positions shown · non-contrast
Comparison: None

CLINICAL DATA: Peri menopausal bleeding, irregular menses, new
diagnosis of breast cancer

EXAM:
TRANSABDOMINAL AND TRANSVAGINAL ULTRASOUND OF PELVIS
TECHNIQUE: Both transabdominal and transvaginal ultrasound examinations of the
pelvis were performed. Transabdominal technique was performed for
global imaging of the pelvis including uterus, ovaries, adnexal
regions, and pelvic cul-de-sac. It was necessary to proceed with
endovaginal exam following the transabdominal exam to visualize the
endometrium and adnexal structures.

[Series 1: us pelvis complete with transvaginal · 0.28mm/px · 53 acquisitions, 13 frames shown]
[im 1/53]
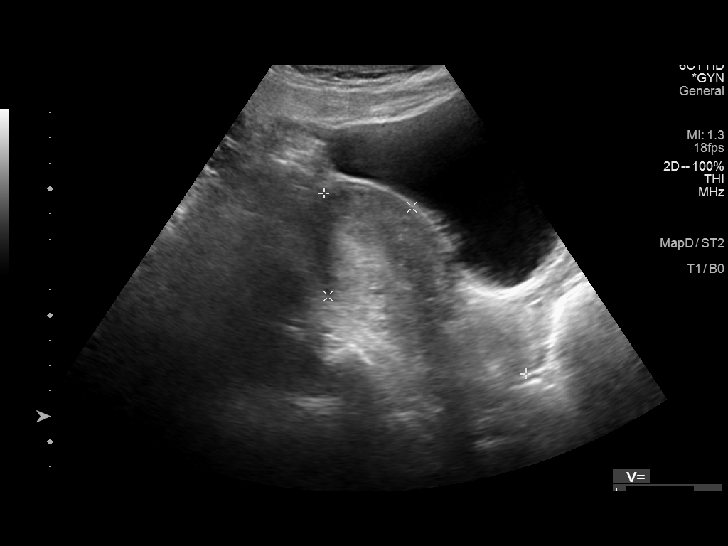
[im 5/53]
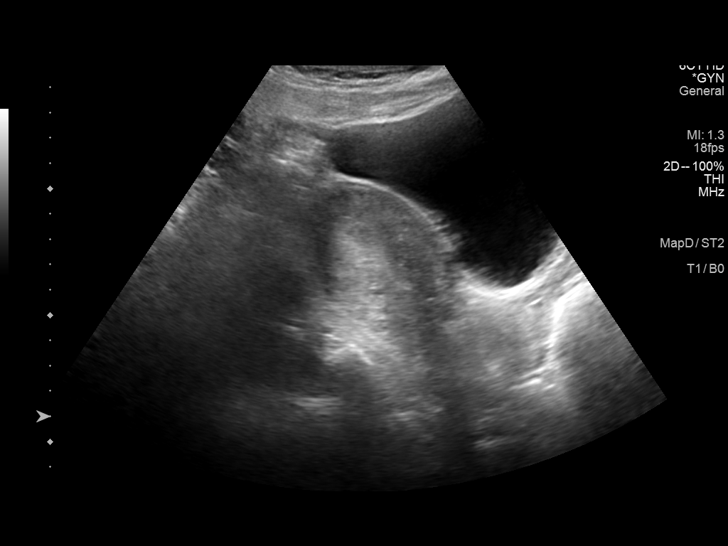
[im 9/53]
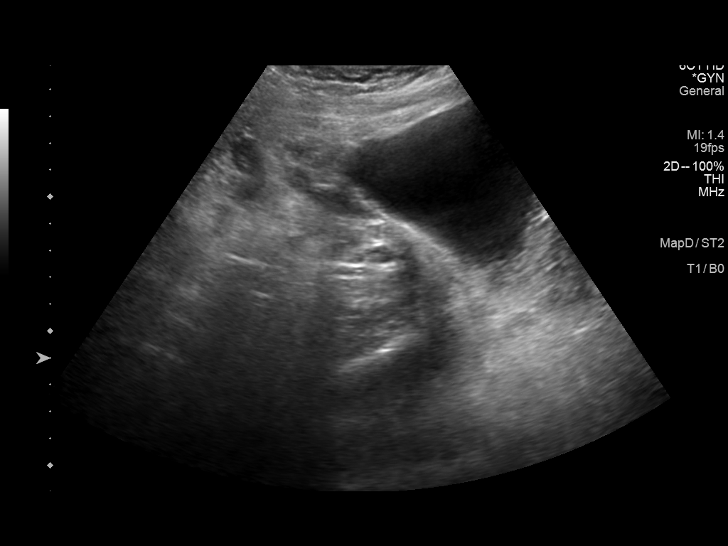
[im 14/53]
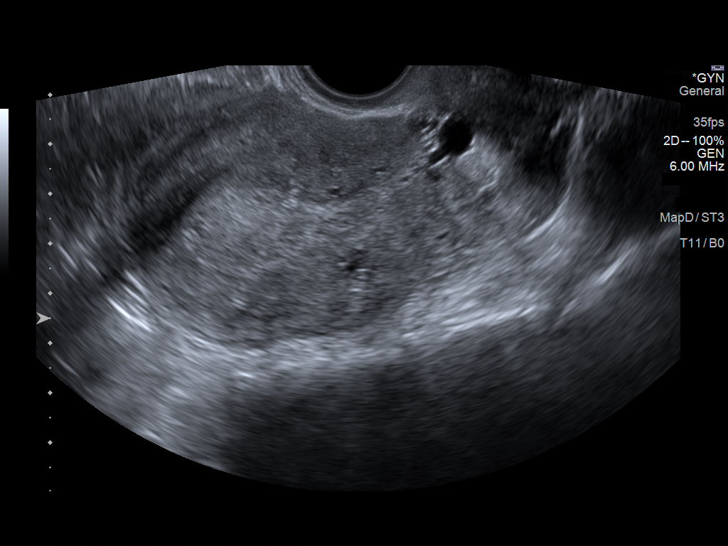
[im 18/53]
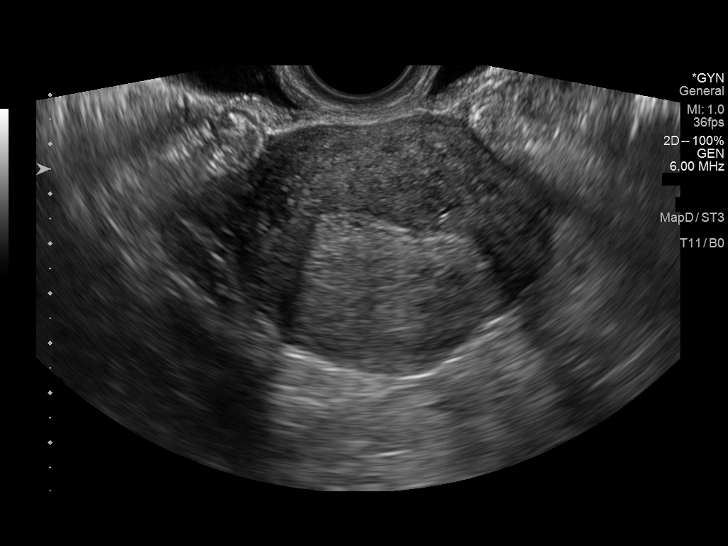
[im 22/53]
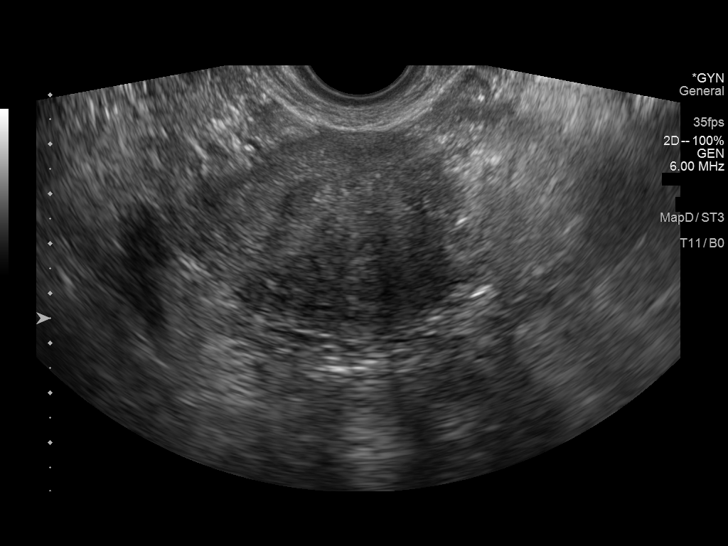
[im 27/53]
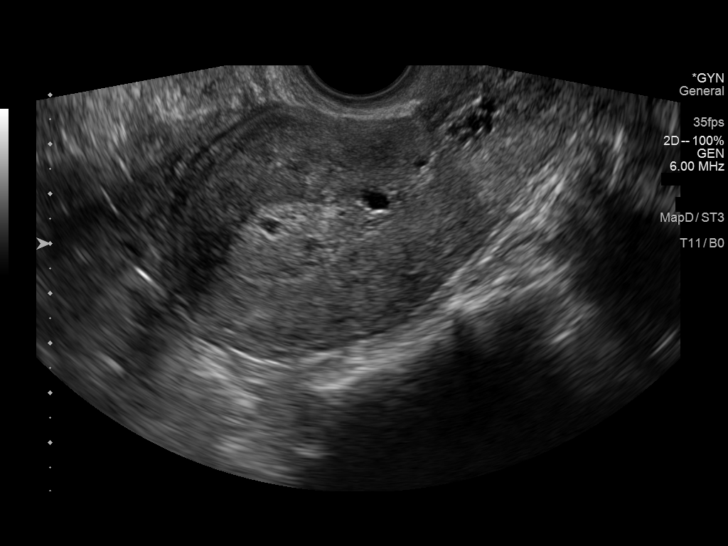
[im 31/53]
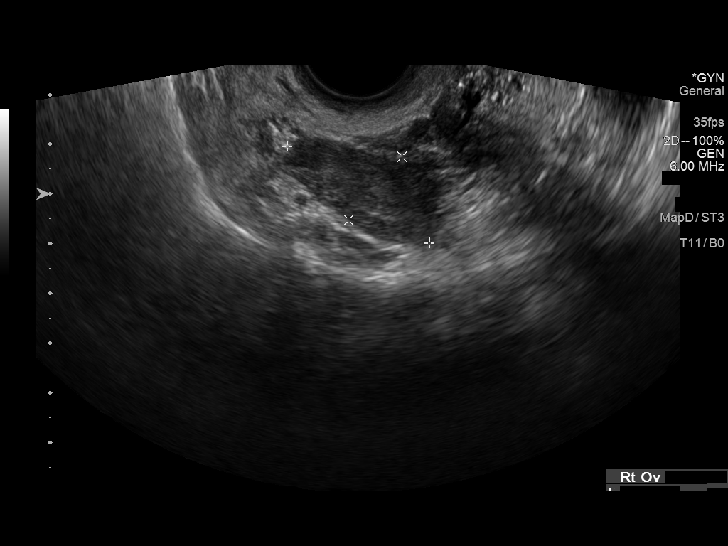
[im 35/53]
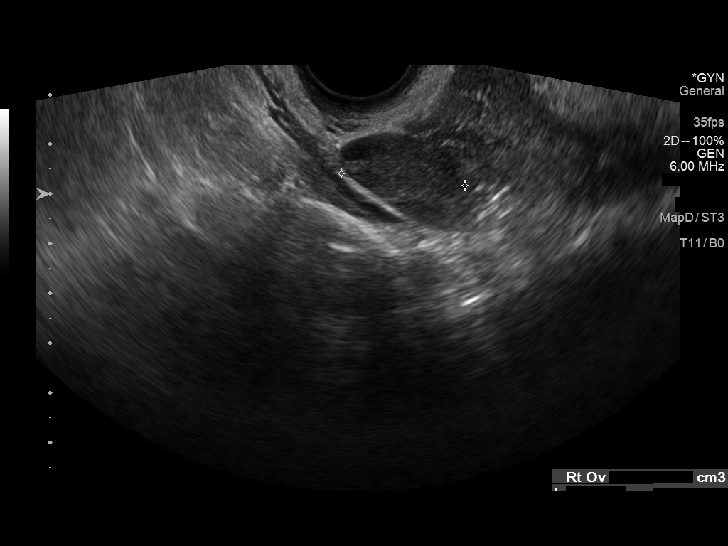
[im 40/53]
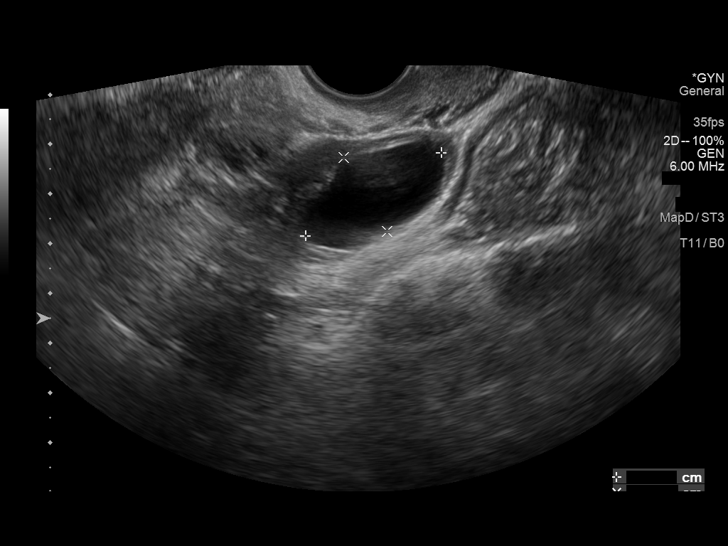
[im 44/53]
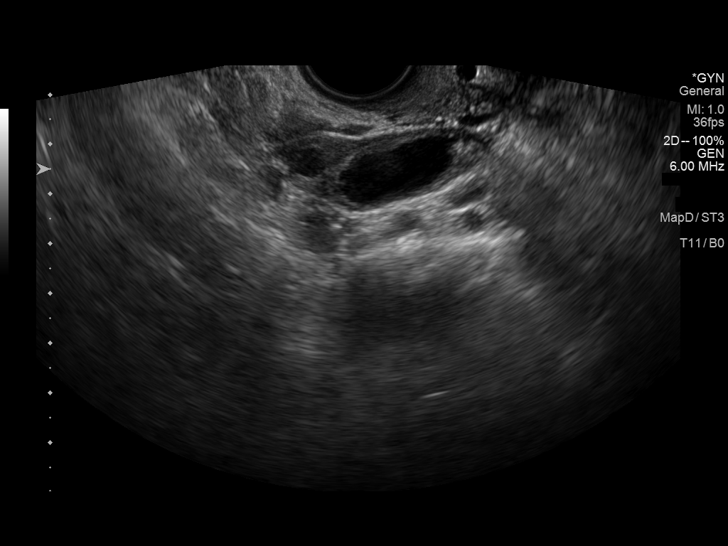
[im 48/53]
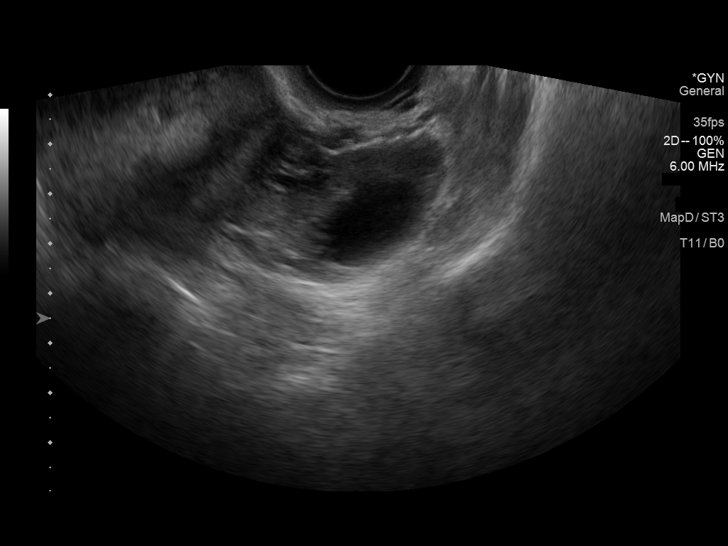
[im 53/53]
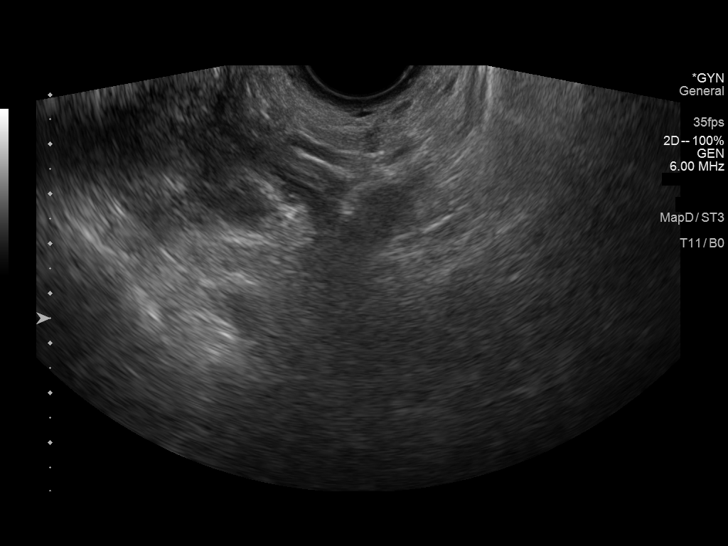

[13 of 25 positions shown; findings below may reference images not displayed]

FINDINGS: Uterus

Measurements: 10.7 x 4.8 x 6.3 cm = volume: 170 mL. No fibroids or
other mass visualized.

Endometrium

Thickness: 18 mm. The endometrium is heterogeneous, with multiple
cystic areas identified within the endometrium and myometrium.

Right ovary

Measurements: 3.5 x 1.7 x 2.5 cm = volume: 7.5 mL. Right ovary is
grossly unremarkable without focal abnormality.

Left ovary

Measurements: 5.3 x 2.6 x 5.4 cm = volume: 39.5 mL. Benign simple
left ovarian cyst measuring 3.2 x 1.7 x 3.3 cm is identified.

Other findings

No abnormal free fluid.
IMPRESSION: 1. Thickened endometrium in a perimenopausal patient, please
correlate with menstrual history.
2. Heterogeneous cystic areas within the endometrial thickening, as
well as scattered myometrial cysts. Findings can be seen with
tamoxifen therapy, please correlate with therapeutic history in this
patient with a recent diagnosis of breast cancer. Otherwise,
adenomyosis could be considered.
3. 3.3 cm left ovarian cyst. This has benign characteristics and is
a common finding in premenopausal females. No imaging follow up is
required. This follows consensus guidelines: Simple Adnexal Cysts:
SRU Consensus Conference Update on Follow-up and Reporting.

## 2021-10-29 IMAGING — DX MM BREAST SURGICAL SPECIMEN
1 series · 2 of 2 positions shown · non-contrast
Comparison: Previous exam(s).

CLINICAL DATA: Evaluate specimen

EXAM:
SPECIMEN RADIOGRAPH OF THE LEFT BREAST

[Series 2: specimen digital x-ray, derived · left · 0.10mm/px · 2 of 2 slices shown]
[im 1/2]
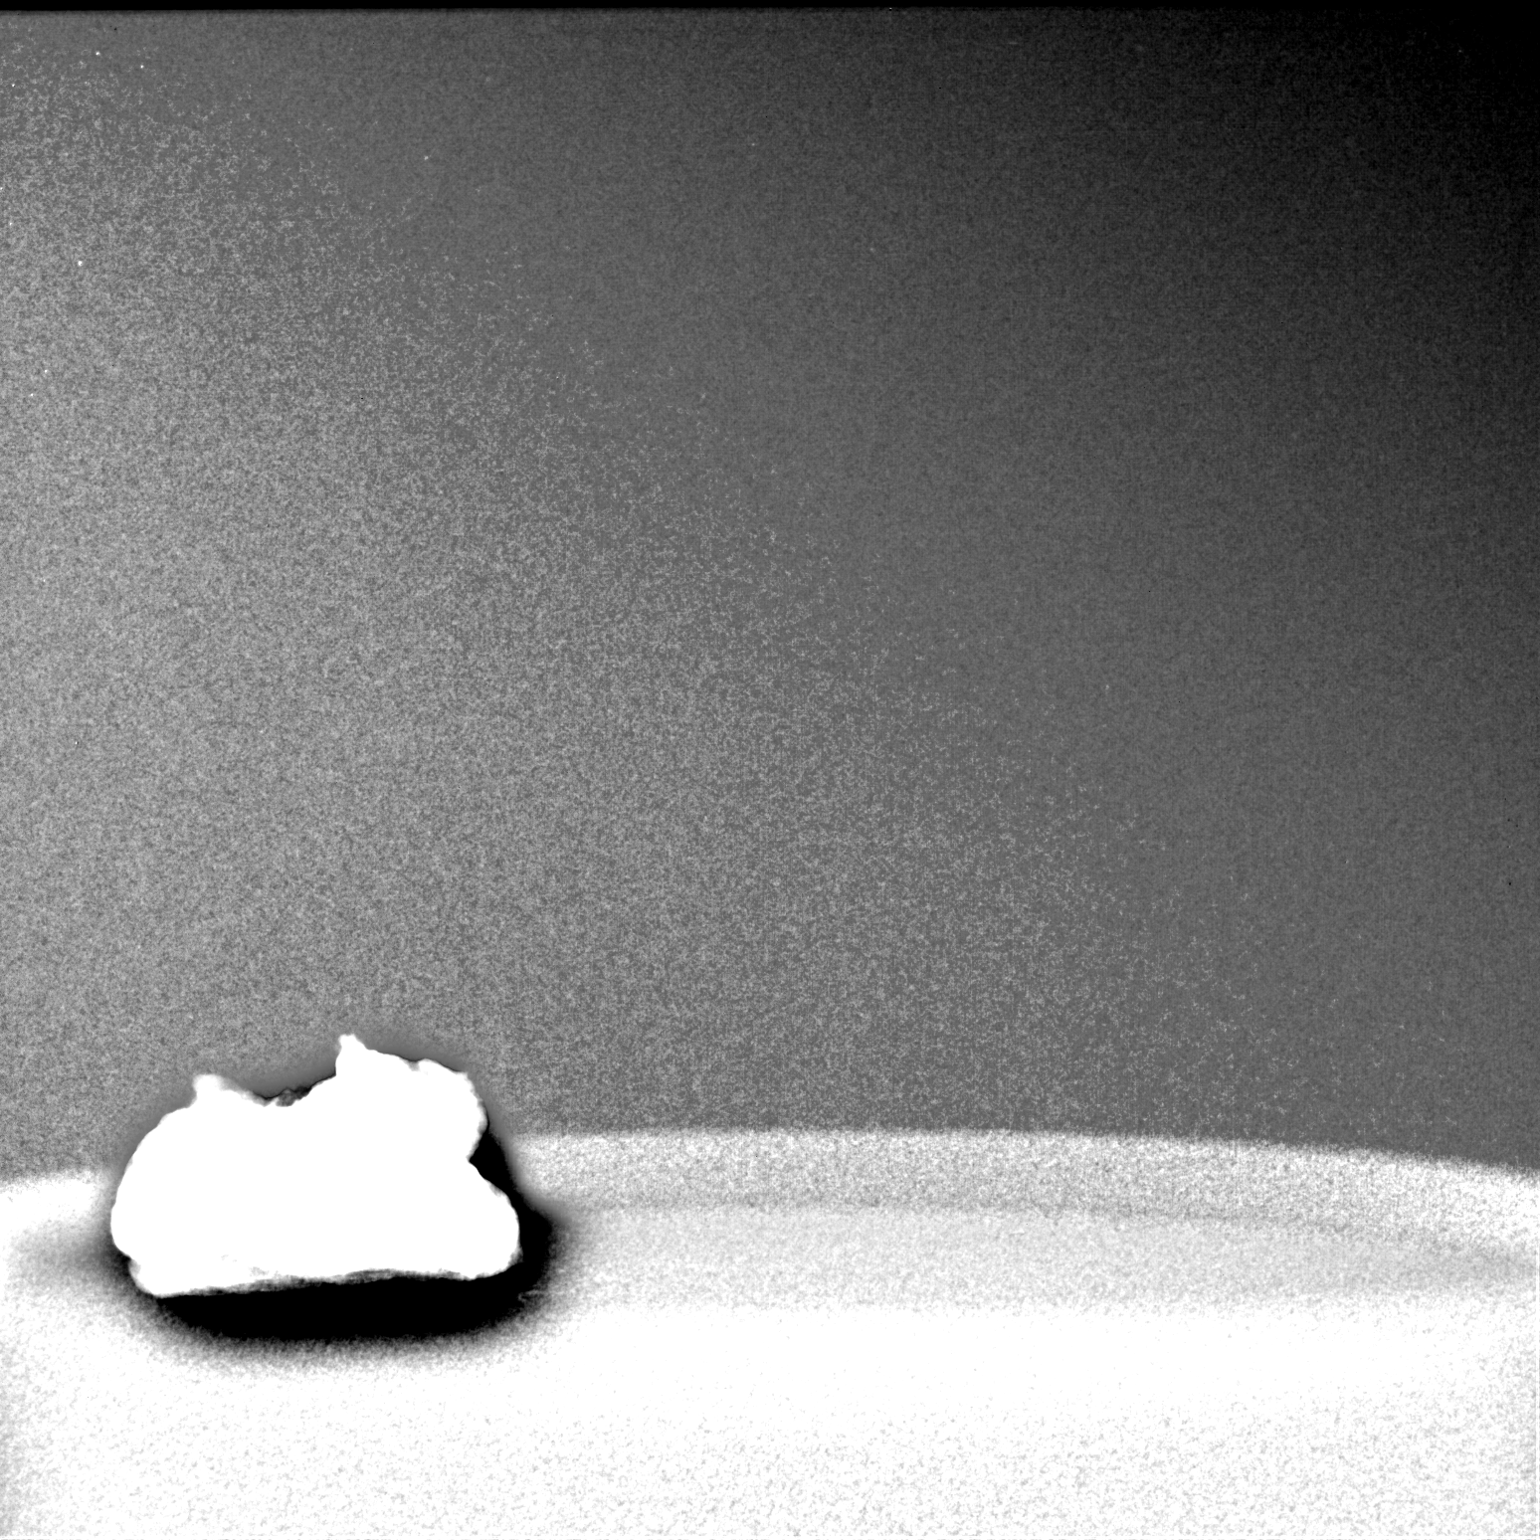
[im 2/2]
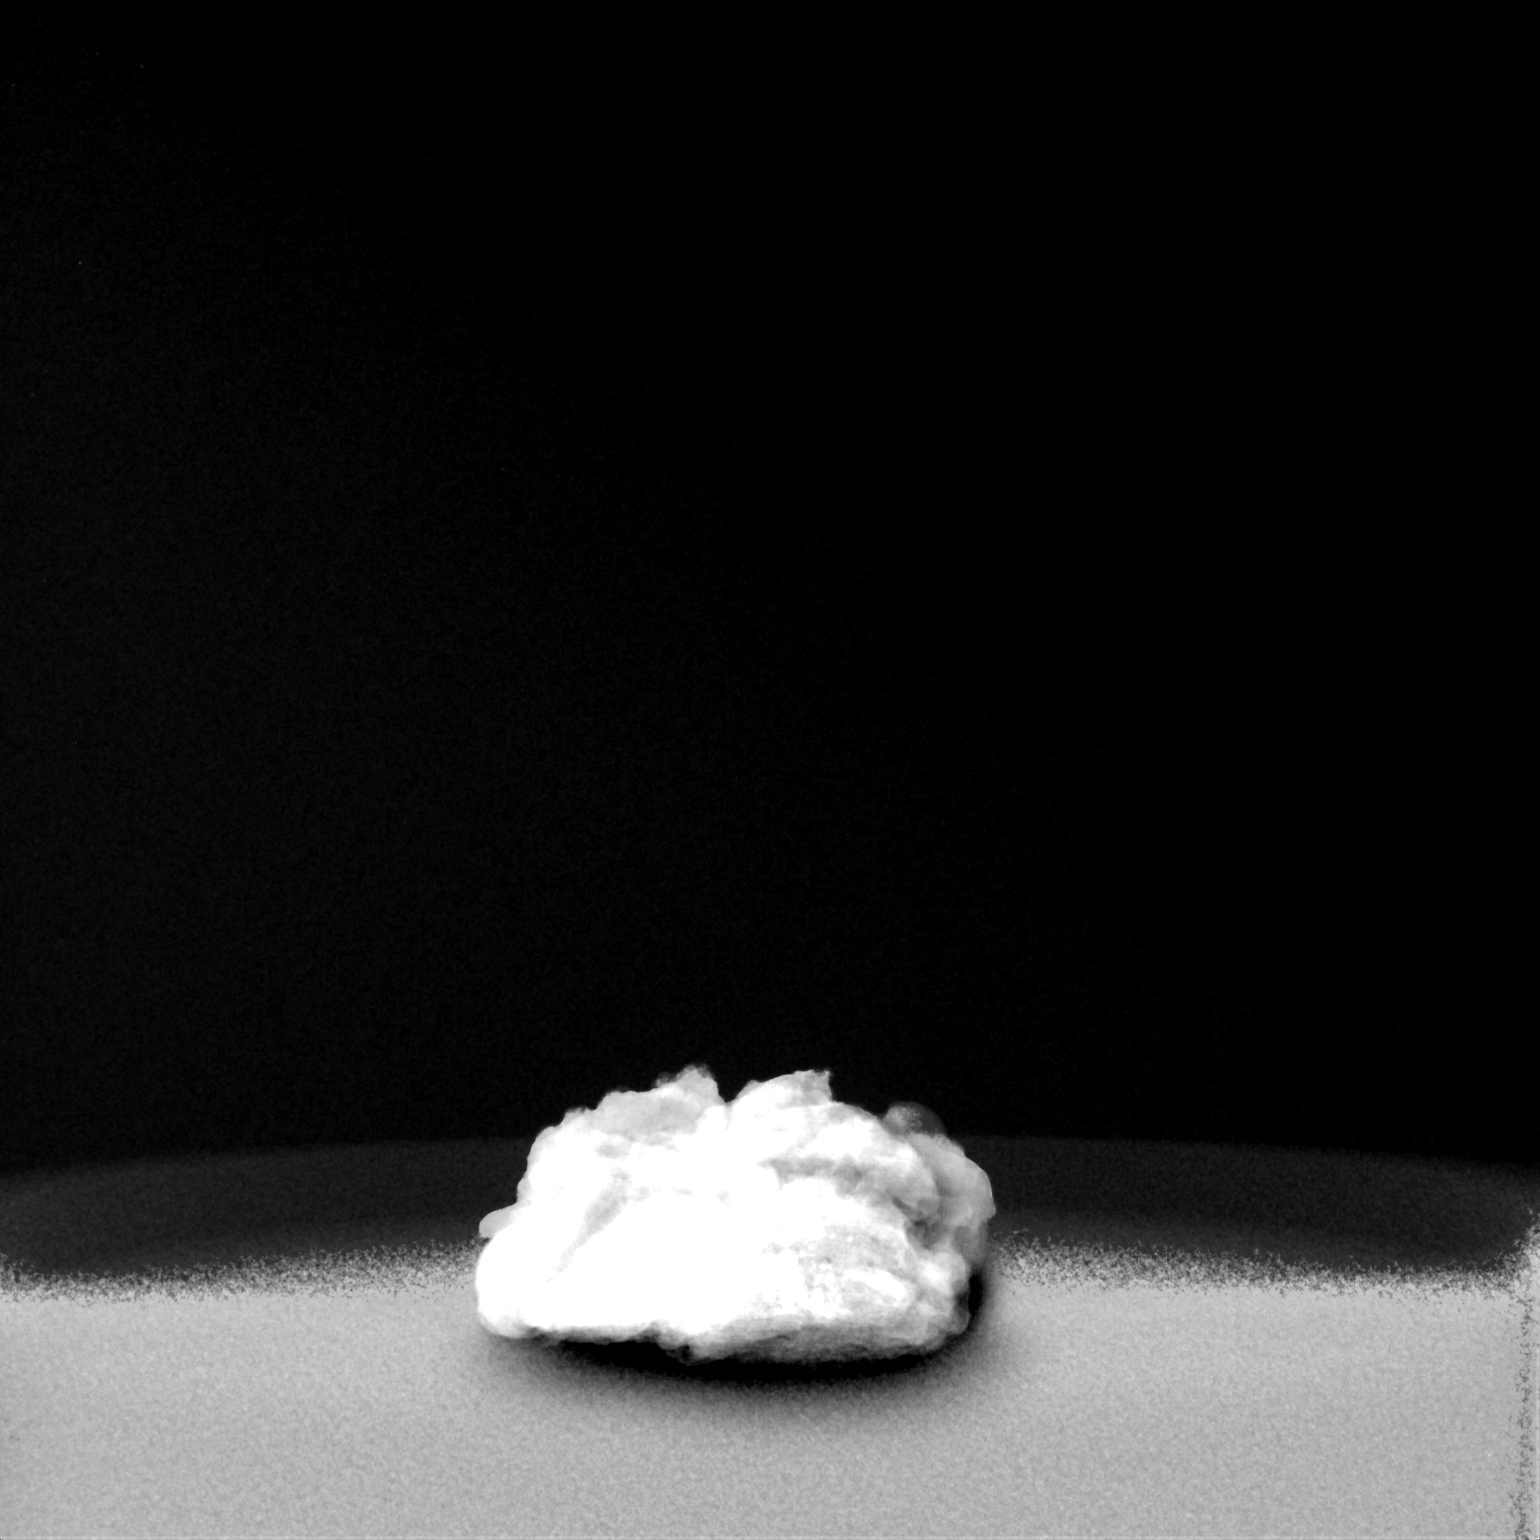

[2 of 2 positions shown; findings below may reference images not displayed]

FINDINGS: Status post excision of the left breast. The radioactive seed and
biopsy marker clip are present, completely intact, and were marked
for pathology.
IMPRESSION: Specimen radiograph of the left breast. Of note, the calcifications
associated with the patient's malignancy are near the superior and
posterior margins of the specimen.

## 2021-12-21 NOTE — Progress Notes (Signed)
Patient Care Team: [name], MD as PCP - General (Internal Medicine) Kyung Rudd, MD as Consulting Physician (Radiation Oncology) [name], MD as Consulting Physician (General Surgery) Belva Crome, MD as Consulting Physician (Cardiology) Charlotte Crumb, MD as Consulting Physician (Orthopedic Surgery)  DIAGNOSIS:  Encounter Diagnosis  Name Primary?   Malignant neoplasm of upper-outer quadrant of left breast in female, estrogen receptor positive ([place])     SUMMARY OF ONCOLOGIC HISTORY: Oncology History  Malignant neoplasm of upper-outer quadrant of left breast in female, estrogen receptor positive ([place])  08/09/2019 Genetic Testing   Negative genetic testing:  No pathogenic variants detected on the [facility] Breast Cancer STAT or Common Hereditary Cancers panel. A variant of uncertain significance was detected in the MSH3 gene called c.803G>A (p.Arg268Gln). The report date is 08/09/2019.  The STAT Breast cancer panel offered by [facility] includes sequencing and rearrangement analysis for the following 9 genes:  ATM, BRCA1, BRCA2, CDH1, CHEK2, PALB2, PTEN, STK11 and TP53.  The Common Hereditary Cancers Panel offered by [facility] includes sequencing and/or deletion duplication testing of the following 48 genes: APC, ATM, AXIN2, BARD1, BMPR1A, BRCA1, BRCA2, BRIP1, CDH1, CDK4, CDKN2A (p14ARF), CDKN2A (p16INK4a), CHEK2, CTNNA1, DICER1, EPCAM (Deletion/duplication testing only), GREM1 (promoter region deletion/duplication testing only), KIT, MEN1, MLH1, MSH2, MSH3, MSH6, MUTYH, NBN, NF1, NHTL1, PALB2, PDGFRA, PMS2, POLD1, POLE, PTEN, RAD50, RAD51C, RAD51D, RNF43, SDHB, SDHC, SDHD, SMAD4, SMARCA4. STK11, TP53, TSC1, TSC2, and VHL.  The following genes were evaluated for sequence changes only: SDHA and HOXB13 c.251G>A variant only.    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 (a) a single sentinel lymph node was removed (b) additional surgery 09/11/2019 cleared the margins   08/21/2019 Cancer Staging   Staging form: Breast, AJCC 8th Edition - Pathologic stage from 08/21/2019: Stage IA (pT1c, pN17m, cM0, G2, ER+, PR+, HER2-) - Signed by [name] on 09/05/2019   07/2019 Oncotype testing   Oncotype score of 16 predicts a risk of recurrence outside the breast in the next 9 years of 15% if the patient's only systemic therapy is tamoxifen for 5 years   10/12/2019 -  Anti-estrogen oral therapy   (a) goserelin/Zoladex started 10/12/2019, discontinued after 02/29/2020 dose (b) anastrozole started 11/27/2019, discontinued May 2022 with multiple side effects (c) bilateral salpingo-oophorectomy 03/20/2020 (d) bone density 05/19/2020 shows a T score of -0.1 (normal). (e) exemestane started July 2022   10/17/2019 - 11/13/2019 Radiation Therapy   adjuvant radiation      CHIEF COMPLIANT: Follow-up on exemestane    INTERVAL HISTORY: Robin Castillo a 51y.o with the above mention. She presents to the clinic for a follow-up. She states that she has pain in the hand and wrist. The wrist has got 90% better and the hands still having some numbness. Overall it is manageable. She has got use to the hot flashes. The gabapentin helps. She said if she stays away from sugar and caffeine she feels better. She knows her trigger. She also gets injections for er trigger finger.   ALLERGIES:  has No Known Allergies.  MEDICATIONS:  Current Outpatient Medications  Medication Sig Dispense Refill   Ascorbic Acid (VITAMIN C) 1000 MG tablet Take 2 tablets (2,000 mg total) by mouth daily.     Azilsartan-Chlorthalidone (EDARBYCLOR) 40-12.5 MG TABS Take by mouth. 30 tablet    ergocalciferol (VITAMIN D2) 1.25 MG (50000  UT) capsule Take 1 capsule (50,000 Units total) by mouth once a week.     exemestane (AROMASIN) 25 MG tablet Take 1  tablet (25 mg total) by mouth daily after breakfast. 90 tablet 0   fluocinolone (VANOS) 0.01 % cream Apply topically as needed.     gabapentin (NEURONTIN) 300 MG capsule Take 1 capsule (300 mg total) by mouth at bedtime as needed. 90 capsule 3   NALTREXONE HCL PO Take 3 mg by mouth daily.     Omega-3 Fatty Acids (FISH OIL PO) Take by mouth daily.     Turmeric 500 MG TABS PATIENT TAKES IN DIET     vitamin B-12 (CYANOCOBALAMIN) 100 MCG tablet Take 1 tablet (100 mcg total) by mouth daily.     No current facility-administered medications for this visit.    PHYSICAL EXAMINATION: ECOG PERFORMANCE STATUS: 1 - Symptomatic but completely ambulatory  Vitals:   01/04/22 0906  BP: (!) 123/92  Pulse: 72  Resp: 18  Temp: 97.8 F (36.6 C)  SpO2: 100%   Filed Weights   01/04/22 0906  Weight: 142 lb 12.8 oz (64.8 kg)    BREAST: No palpable masses or nodules in either right or left breasts. No palpable axillary supraclavicular or infraclavicular adenopathy no breast tenderness or nipple discharge. (exam performed in the presence of a chaperone)  LABORATORY DATA:  I have reviewed the data as listed    Latest Ref Rng & Units 07/09/2021    9:45 AM 03/24/2021    8:45 AM 11/18/2020    8:38 AM  CMP  Glucose 70 - 99 mg/dL 80  87  91   BUN 6 - 20 mg/dL _0 Creatinine 0.44 - 1.00 mg/dL 0.73  0.83  0.81   Sodium 135 - 145 mmol/L 139  140  141   Potassium 3.5 - 5.1 mmol/L 3.8  3.8  3.5   Chloride 98 - 111 mmol/L 102  105  103   CO2 22 - 32 mmol/L _1 Calcium 8.9 - 10.3 mg/dL 10.0  9.8  10.0   Total Protein 6.5 - 8.1 g/dL 7.3  7.2  7.6   Total Bilirubin 0.3 - 1.2 mg/dL 0.6  0.7  0.7   Alkaline Phos 38 - 126 U/L 77  78  85   AST 15 - 41 U/L _2 ALT 0 - 44 U/L _3 Lab Results  Component Value Date   WBC 3.3 (L) 07/09/2021   HGB 12.6 07/09/2021   HCT 38.6 07/09/2021   MCV 89.4 07/09/2021   PLT 230 07/09/2021   NEUTROABS 1.7 07/09/2021    ASSESSMENT &  PLAN:  Malignant neoplasm of upper-outer quadrant of left breast in female, estrogen receptor positive ([place]) left lumpectomy and sentinel lymph node sampling 08/21/2019 for a pT1c pN1(mic), 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  0/1 lymph node negative, additional surgery 09/11/2019.  The margins Oncotype DX score 16 (risk of recurrence at 9 years: 15%) Adjuvant radiation 10/17/2019 11/13/2019   Current treatment: Exemestane (10/12/2019: Goserelin: Discontinued, anastrozole started 11/27/2019 discontinued 11/14/2020, BSO: 03/20/2020, exemestane started July 2022)   Exemestane toxicities: Tolerating the treatment extremely well.   Breast cancer surveillance: 1.  Breast exam 01/04/2022: Benign 2. mammogram   Left axillary nodule evaluation: Ria Comment ordered mammogram and ultrasound   Return to clinic in 1 year  for follow-up    No orders of the defined types were placed in this encounter.  The patient has a good understanding of the overall plan. she agrees with it. she will call with any problems that may develop before the next visit here. Total time spent: 30 mins including face to face time and time spent for planning, charting and co-ordination of care   Harriette Ohara, MD 01/04/22    I Gardiner Coins am scribing for Dr. Lindi Adie  I have reviewed the above documentation for accuracy and completeness, and I agree with the above.

## 2021-12-28 ENCOUNTER — Ambulatory Visit: Payer: BC Managed Care – PPO | Attending: Radiation Oncology

## 2021-12-28 VITALS — Wt 146.1 lb

## 2021-12-28 DIAGNOSIS — Z483 Aftercare following surgery for neoplasm: Secondary | ICD-10-CM | POA: Insufficient documentation

## 2021-12-28 NOTE — Therapy (Signed)
OUTPATIENT PHYSICAL THERAPY SOZO SCREENING NOTE   Patient Name: Robin Castillo MRN: [id number] DOB:1970/09/09, 51 y.o., female Today's Date: 12/28/2021  PCP: [name], MD REFERRING PROVIDER: Hayden Pedro,*   PT End of Session - 12/28/21 [phone]     Visit Number 2   # unchanged due to screen only   PT Start Time 0973    PT Stop Time 1004    PT Time Calculation (min) 14 min    Activity Tolerance Patient tolerated treatment well    Behavior During Therapy [facility] for tasks assessed/performed             Past Medical History:  Diagnosis Date   Arthritis    per patient new onset in finger joints   BV (bacterial vaginosis) 2012   Cancer ([place]) 2021   left breast    Cyst of ovary, right 2003   Family history of prostate cancer in father    H/O rubella    H/O varicella as child   History of PCOS 06/2005   Hypertension    Infertility, female    Irregular menses 11/2001   Migraine    Pregnancy induced hypertension    Psoriasis    Psychosocial stressors 10/2009   Past Surgical History:  Procedure Laterality Date   BREAST ENHANCEMENT SURGERY Bilateral 2013   BREAST LUMPECTOMY WITH RADIOACTIVE SEED AND SENTINEL LYMPH NODE BIOPSY Left 08/21/2019   Procedure: LEFT BREAST LUMPECTOMY WITH RADIOACTIVE SEED AND LEFT AXILLARY SENTINEL LYMPH NODE BIOPSY;  Surgeon: [name], MD;  Location: [place];  Service: General;  Laterality: Left;   BREAST SURGERY     CESAREAN SECTION     x 1    RE-EXCISION OF BREAST LUMPECTOMY Left 09/11/2019   Procedure: LEFT RE-EXCISION OF BREAST MARGIN;  Surgeon: [name], MD;  Location: [place];  Service: General;  Laterality: Left;   ROBOTIC ASSISTED BILATERAL SALPINGO OOPHERECTOMY Bilateral 03/20/2020   Procedure: XI ROBOTIC ASSISTED BILATERAL SALPINGO OOPHORECTOMY;  Surgeon: [name], MD;  Location: [place];  Service: Gynecology;  Laterality: Bilateral;  Dr. [name] requesting  2 / 2 1/2 hours   Patient Active Problem List   Diagnosis Date Noted   Psoriasis 05/05/2021   B12 deficiency 06/09/2020   Genetic testing 08/10/2019   Family history of prostate cancer in father    Malignant neoplasm of upper-outer quadrant of left breast in female, estrogen receptor positive ([place]) 07/30/2019    REFERRING DIAG: left breast cancer at risk for lymphedema  THERAPY DIAG:  Aftercare following surgery for neoplasm  PERTINENT HISTORY: Patient was diagnosed on 07/25/2019 with left grade II invasive ductal carcinoma breast cancer. Patient reports she underwent a left lumpectomy and sentinel node biopsy (1 node removed with micromets) on 08/21/2019. It is ER/PR positive and HER2 negative with a Ki67 of 2%.She had 20 radiation treatments with booster past histroy of breat implants in 2013, possible psoriatic arthritis and possible carpal tunnel on the right   PRECAUTIONS: left UE Lymphedema risk, None  SUBJECTIVE: Pt returns for her 6 month L-Dex screen.  PAIN:  Are you having pain? No  SOZO SCREENING: Patient was assessed today using the SOZO machine to determine the lymphedema index score. This was compared to her baseline score. It was determined that she is within the recommended range when compared to her baseline and no further action is needed at this time. She will continue SOZO screenings. These are done every 3 months for 2 years post  operatively followed by every 6 months for 2 years, and then annually.    [name], PTA 12/28/2021, 10:04 AM

## 2022-01-04 ENCOUNTER — Inpatient Hospital Stay: Payer: BC Managed Care – PPO | Attending: Hematology and Oncology | Admitting: Hematology and Oncology

## 2022-01-04 ENCOUNTER — Other Ambulatory Visit: Payer: Self-pay

## 2022-01-04 DIAGNOSIS — Z923 Personal history of irradiation: Secondary | ICD-10-CM | POA: Diagnosis not present

## 2022-01-04 DIAGNOSIS — Z170 Estrogen receptor positive status [ER+]: Secondary | ICD-10-CM | POA: Insufficient documentation

## 2022-01-04 DIAGNOSIS — C50412 Malignant neoplasm of upper-outer quadrant of left female breast: Secondary | ICD-10-CM | POA: Insufficient documentation

## 2022-01-04 MED ORDER — VITAMIN C 1000 MG PO TABS: 2000.0000 mg | ORAL_TABLET | Freq: Every day | ORAL | Status: AC

## 2022-01-04 NOTE — Assessment & Plan Note (Addendum)
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  0/1 lymph node negative, additional surgery 09/11/2019.  The margins Oncotype DX score 16 (risk of recurrence at 9 years: 15%) Adjuvant radiation 10/17/2019 11/13/2019  Current treatment: Exemestane (10/12/2019: Goserelin: Discontinued, anastrozole started 11/27/2019 discontinued 11/14/2020, BSO: 03/20/2020, exemestane started July 2022)  Exemestane toxicities: 1. Tolerating the treatment extremely well.  Breast cancer surveillance: 1.  Breast exam 01/04/2022: Benign 2. mammogram   Left axillary nodule evaluation: Ria Comment ordered mammogram and ultrasound  Return to clinic in 1 year for follow-up

## 2022-01-26 ENCOUNTER — Other Ambulatory Visit: Payer: Self-pay | Admitting: Hematology and Oncology

## 2022-01-27 ENCOUNTER — Encounter: Payer: Self-pay | Admitting: Hematology and Oncology

## 2022-04-22 NOTE — Progress Notes (Deleted)
Office Visit Note  Patient: Robin Castillo             Date of Birth: [birth year]           MRN: [id number]             PCP: [name], MD Referring: [name], MD Visit Date: 05/04/2022 Occupation: '@GUAROCC'$ @  Subjective:  No chief complaint on file.   History of Present Illness: Robin Castillo is a 51 y.o. female ***returns today after her last visit on June 15, 2021.   Activities of Daily Living:  Patient reports morning stiffness for *** {minute/hour:19697}.   Patient {ACTIONS;DENIES/REPORTS:21021675::"Denies"} nocturnal pain.  Difficulty dressing/grooming: {ACTIONS;DENIES/REPORTS:21021675::"Denies"} Difficulty climbing stairs: {ACTIONS;DENIES/REPORTS:21021675::"Denies"} Difficulty getting out of chair: {ACTIONS;DENIES/REPORTS:21021675::"Denies"} Difficulty using hands for taps, buttons, cutlery, and/or writing: {ACTIONS;DENIES/REPORTS:21021675::"Denies"}  No Rheumatology ROS completed.   PMFS History:  Patient Active Problem List   Diagnosis Date Noted   Psoriasis 05/05/2021   B12 deficiency 06/09/2020   Genetic testing 08/10/2019   Family history of prostate cancer in father    Malignant neoplasm of upper-outer quadrant of left breast in female, estrogen receptor positive ([place]) 07/30/2019    Past Medical History:  Diagnosis Date   Arthritis    per patient new onset in finger joints   BV (bacterial vaginosis) 2012   Cancer ([place]) 2021   left breast    Cyst of ovary, right 2003   Family history of prostate cancer in father    H/O rubella    H/O varicella as child   History of PCOS 06/2005   Hypertension    Infertility, female    Irregular menses 11/2001   Migraine    Pregnancy induced hypertension    Psoriasis    Psychosocial stressors 10/2009    Family History  Problem Relation Age of Onset   Heart disease Mother    Hypertension Mother    Heart disease Father    Depression Father    Alcohol abuse Father    Prostate cancer Father 65    Hypertension Sister    Hypercholesterolemia Sister    Heart disease Maternal Grandmother    Heart disease Maternal Grandfather    Healthy Son    Healthy Son    Past Surgical History:  Procedure Laterality Date   BREAST ENHANCEMENT SURGERY Bilateral 2013   BREAST LUMPECTOMY WITH RADIOACTIVE SEED AND SENTINEL LYMPH NODE BIOPSY Left 08/21/2019   Procedure: LEFT BREAST LUMPECTOMY WITH RADIOACTIVE SEED AND LEFT AXILLARY SENTINEL LYMPH NODE BIOPSY;  Surgeon: [name], MD;  Location: [place];  Service: General;  Laterality: Left;   BREAST SURGERY     CESAREAN SECTION     x 1    RE-EXCISION OF BREAST LUMPECTOMY Left 09/11/2019   Procedure: LEFT RE-EXCISION OF BREAST MARGIN;  Surgeon: [name], MD;  Location: [place];  Service: General;  Laterality: Left;   ROBOTIC ASSISTED BILATERAL SALPINGO OOPHERECTOMY Bilateral 03/20/2020   Procedure: XI ROBOTIC ASSISTED BILATERAL SALPINGO OOPHORECTOMY;  Surgeon: [name], MD;  Location: [place];  Service: Gynecology;  Laterality: Bilateral;  Dr. [name] requesting 2 / 2 1/2 hours   Social History   Social History Narrative   Not on file   Immunization History  Administered Date(s) Administered   Influenza,inj,Quad PF,6+ Mos 03/18/2018   PFIZER(Purple Top)SARS-COV-2 Vaccination 10/05/2019, 10/31/2019     Objective: Vital Signs: There were no vitals taken for this visit.   Physical Exam   Musculoskeletal Exam: ***  CDAI Exam:  CDAI Score: -- Patient Global: --; Provider Global: -- Swollen: --; Tender: -- Joint Exam 05/04/2022   No joint exam has been documented for this visit   There is currently no information documented on the homunculus. Go to the Rheumatology activity and complete the homunculus joint exam.  Investigation: No additional findings.  Imaging: No results found.  Recent Labs: Lab Results  Component Value Date   WBC 3.3 (L) 07/09/2021   HGB  12.6 07/09/2021   PLT 230 07/09/2021   NA 139 07/09/2021   K 3.8 07/09/2021   CL 102 07/09/2021   CO2 31 07/09/2021   GLUCOSE 80 07/09/2021   BUN 6 07/09/2021   CREATININE 0.73 07/09/2021   BILITOT 0.6 07/09/2021   ALKPHOS 77 07/09/2021   AST 16 07/09/2021   ALT 14 07/09/2021   PROT 7.3 07/09/2021   ALBUMIN 4.6 07/09/2021   CALCIUM 10.0 07/09/2021   GFRAA >60 03/14/2020   Labs done at [facility] February 03, 2021 ANA negative,C3-C4 normal, PTH normal, TSH normal, calcium normal, albumin normal. Speciality Comments:     August 20, 2021 ultrasound of bilateral hands showed no synovitis or tenosynovitis.  Bilateral median nerves are within normal limits.  Procedures:  No procedures performed Allergies: Patient has no known allergies.   Assessment / Plan:     Visit Diagnoses: No diagnosis found.  Orders: No orders of the defined types were placed in this encounter.  No orders of the defined types were placed in this encounter.   Face-to-face time spent with patient was *** minutes. Greater than 50% of time was spent in counseling and coordination of care.  Follow-Up Instructions: No follow-ups on file.   Bo Merino, MD  Note - This record has been created using Editor, commissioning.  Chart creation errors have been sought, but may not always  have been located. Such creation errors do not reflect on  the standard of medical care.

## 2022-04-29 ENCOUNTER — Other Ambulatory Visit: Payer: Self-pay | Admitting: *Deleted

## 2022-04-29 MED ORDER — EXEMESTANE 25 MG PO TABS: ORAL_TABLET | ORAL | 3 refills | Status: DC

## 2022-04-29 MED ORDER — GABAPENTIN 300 MG PO CAPS: 300.0000 mg | ORAL_CAPSULE | Freq: Every evening | ORAL | 3 refills | Status: DC | PRN

## 2022-05-04 ENCOUNTER — Ambulatory Visit: Payer: BC Managed Care – PPO | Admitting: Rheumatology

## 2022-05-04 DIAGNOSIS — E538 Deficiency of other specified B group vitamins: Secondary | ICD-10-CM

## 2022-05-04 DIAGNOSIS — L409 Psoriasis, unspecified: Secondary | ICD-10-CM

## 2022-05-04 DIAGNOSIS — Z8742 Personal history of other diseases of the female genital tract: Secondary | ICD-10-CM

## 2022-05-04 DIAGNOSIS — Z170 Estrogen receptor positive status [ER+]: Secondary | ICD-10-CM

## 2022-05-04 DIAGNOSIS — M65351 Trigger finger, right little finger: Secondary | ICD-10-CM

## 2022-05-04 DIAGNOSIS — M79641 Pain in right hand: Secondary | ICD-10-CM

## 2022-05-04 DIAGNOSIS — M79671 Pain in right foot: Secondary | ICD-10-CM

## 2022-05-04 DIAGNOSIS — E559 Vitamin D deficiency, unspecified: Secondary | ICD-10-CM

## 2022-05-04 DIAGNOSIS — I10 Essential (primary) hypertension: Secondary | ICD-10-CM

## 2022-05-24 ENCOUNTER — Telehealth: Payer: Self-pay | Admitting: *Deleted

## 2022-05-24 NOTE — Telephone Encounter (Signed)
Received call from pt with complaint of burning pain in her lower back x2 weeks.  Pt states she was seen by PCP and told it was not a pinched nerve.  Pt requesting advice from office if symptoms are related to Exemestane.  Per MD pt to stop Exemestane for 2 weeks and f/u in office.  Pt educated and verbalized understanding.

## 2022-06-04 ENCOUNTER — Telehealth: Payer: Self-pay | Admitting: *Deleted

## 2022-06-04 NOTE — Progress Notes (Signed)
Patient Care Team: [name], MD as PCP - General (Internal Medicine) Kyung Rudd, MD as Consulting Physician (Radiation Oncology) [name], MD as Consulting Physician (General Surgery) Belva Crome, MD as Consulting Physician (Cardiology) Charlotte Crumb, MD as Consulting Physician (Orthopedic Surgery)  DIAGNOSIS: No diagnosis found.  SUMMARY OF ONCOLOGIC HISTORY: Oncology History  Malignant neoplasm of upper-outer quadrant of left breast in female, estrogen receptor positive ([place])  08/09/2019 Genetic Testing   Negative genetic testing:  No pathogenic variants detected on the [facility] Breast Cancer STAT or Common Hereditary Cancers panel. A variant of uncertain significance was detected in the MSH3 gene called c.803G>A (p.Arg268Gln). The report date is 08/09/2019.  The STAT Breast cancer panel offered by [facility] includes sequencing and rearrangement analysis for the following 9 genes:  ATM, BRCA1, BRCA2, CDH1, CHEK2, PALB2, PTEN, STK11 and TP53.  The Common Hereditary Cancers Panel offered by [facility] includes sequencing and/or deletion duplication testing of the following 48 genes: APC, ATM, AXIN2, BARD1, BMPR1A, BRCA1, BRCA2, BRIP1, CDH1, CDK4, CDKN2A (p14ARF), CDKN2A (p16INK4a), CHEK2, CTNNA1, DICER1, EPCAM (Deletion/duplication testing only), GREM1 (promoter region deletion/duplication testing only), KIT, MEN1, MLH1, MSH2, MSH3, MSH6, MUTYH, NBN, NF1, NHTL1, PALB2, PDGFRA, PMS2, POLD1, POLE, PTEN, RAD50, RAD51C, RAD51D, RNF43, SDHB, SDHC, SDHD, SMAD4, SMARCA4. STK11, TP53, TSC1, TSC2, and VHL.  The following genes were evaluated for sequence changes only: SDHA and HOXB13 c.251G>A variant only.    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 (a) a single sentinel lymph node was removed (b) additional surgery 09/11/2019 cleared  the margins   08/21/2019 Cancer Staging   Staging form: Breast, AJCC 8th Edition - Pathologic stage from 08/21/2019: Stage IA (pT1c, pN73m, cM0, G2, ER+, PR+, HER2-) - Signed by [name] on 09/05/2019   07/2019 Oncotype testing   Oncotype score of 16 predicts a risk of recurrence outside the breast in the next 9 years of 15% if the patient's only systemic therapy is tamoxifen for 5 years   10/12/2019 -  Anti-estrogen oral therapy   (a) goserelin/Zoladex started 10/12/2019, discontinued after 02/29/2020 dose (b) anastrozole started 11/27/2019, discontinued May 2022 with multiple side effects (c) bilateral salpingo-oophorectomy 03/20/2020 (d) bone density 05/19/2020 shows a T score of -0.1 (normal). (e) exemestane started July 2022   10/17/2019 - 11/13/2019 Radiation Therapy   adjuvant radiation      CHIEF COMPLIANT:   INTERVAL HISTORY: Robin Sheeranis a   ALLERGIES:  has No Known Allergies.  MEDICATIONS:  Current Outpatient Medications  Medication Sig Dispense Refill   Ascorbic Acid (VITAMIN C) 1000 MG tablet Take 2 tablets (2,000 mg total) by mouth daily.     Azilsartan-Chlorthalidone (EDARBYCLOR) 40-12.5 MG TABS Take by mouth. 30 tablet    ergocalciferol (VITAMIN D2) 1.25 MG (50000 UT) capsule Take 1 capsule (50,000 Units total) by mouth once a week.     exemestane (AROMASIN) 25 MG tablet TAKE 1 TABLET DAILY AFTER  BREAKFAST 90 tablet 3   fluocinolone (VANOS) 0.01 % cream Apply topically as needed.     gabapentin (NEURONTIN) 300 MG capsule Take 1 capsule (300 mg total) by mouth at bedtime as needed. 90 capsule 3   NALTREXONE HCL PO Take 3 mg by mouth daily.     Omega-3 Fatty Acids (FISH OIL PO) Take by mouth daily.     Turmeric 500 MG TABS PATIENT TAKES IN DIET  vitamin B-12 (CYANOCOBALAMIN) 100 MCG tablet Take 1 tablet (100 mcg total) by mouth daily.     No current facility-administered medications for this visit.    PHYSICAL EXAMINATION: ECOG  PERFORMANCE STATUS: {CHL ONC ECOG [phone]}  There were no vitals filed for this visit. There were no vitals filed for this visit.  BREAST:*** No palpable masses or nodules in either right or left breasts. No palpable axillary supraclavicular or infraclavicular adenopathy no breast tenderness or nipple discharge. (exam performed in the presence of a chaperone)  LABORATORY DATA:  I have reviewed the data as listed    Latest Ref Rng & Units 07/09/2021    9:45 AM 03/24/2021    8:45 AM 11/18/2020    8:38 AM  CMP  Glucose 70 - 99 mg/dL 80  87  91   BUN 6 - 20 mg/dL _0 Creatinine 0.44 - 1.00 mg/dL 0.73  0.83  0.81   Sodium 135 - 145 mmol/L 139  140  141   Potassium 3.5 - 5.1 mmol/L 3.8  3.8  3.5   Chloride 98 - 111 mmol/L 102  105  103   CO2 22 - 32 mmol/L _1 Calcium 8.9 - 10.3 mg/dL 10.0  9.8  10.0   Total Protein 6.5 - 8.1 g/dL 7.3  7.2  7.6   Total Bilirubin 0.3 - 1.2 mg/dL 0.6  0.7  0.7   Alkaline Phos 38 - 126 U/L 77  78  85   AST 15 - 41 U/L _2 ALT 0 - 44 U/L _3 Lab Results  Component Value Date   WBC 3.3 (L) 07/09/2021   HGB 12.6 07/09/2021   HCT 38.6 07/09/2021   MCV 89.4 07/09/2021   PLT 230 07/09/2021   NEUTROABS 1.7 07/09/2021    ASSESSMENT & PLAN:  No problem-specific Assessment & Plan notes found for this encounter.    No orders of the defined types were placed in this encounter.  The patient has a good understanding of the overall plan. she agrees with it. she will call with any problems that may develop before the next visit here. Total time spent: 30 mins including face to face time and time spent for planning, charting and co-ordination of care   Suzzette Righter, [place] 06/04/22    I, Gardiner Coins, am acting as a scribe for Dr. Nicholas Lose  ***

## 2022-06-04 NOTE — Telephone Encounter (Signed)
Received call from pt with high anxiety concerned about severe sharp right foot pain in the arch area.  Pt states she also continues to experience intermittent lower back pain despite stopping Exemestane.  Pt  is very concerned for metastatic disease and would like to see MD sooner than scheduled appt.  Appt updated, pt educated and verbalized understanding.

## 2022-06-07 ENCOUNTER — Telehealth: Payer: Self-pay

## 2022-06-07 ENCOUNTER — Ambulatory Visit (HOSPITAL_COMMUNITY)
Admission: RE | Admit: 2022-06-07 | Discharge: 2022-06-07 | Disposition: A | Discharge status: Home / Self Care | Payer: BC Managed Care – PPO | Source: Ambulatory Visit | Attending: Hematology and Oncology | Admitting: Hematology and Oncology

## 2022-06-07 ENCOUNTER — Inpatient Hospital Stay: Payer: BC Managed Care – PPO | Attending: Hematology and Oncology | Admitting: Hematology and Oncology

## 2022-06-07 VITALS — BP 114/86 | HR 85 | Temp 97.5°F | Resp 18 | Ht 65.0 in | Wt 134.6 lb

## 2022-06-07 DIAGNOSIS — Z780 Asymptomatic menopausal state: Secondary | ICD-10-CM

## 2022-06-07 DIAGNOSIS — Z79899 Other long term (current) drug therapy: Secondary | ICD-10-CM | POA: Insufficient documentation

## 2022-06-07 DIAGNOSIS — M542 Cervicalgia: Secondary | ICD-10-CM | POA: Diagnosis not present

## 2022-06-07 DIAGNOSIS — M545 Low back pain, unspecified: Secondary | ICD-10-CM | POA: Insufficient documentation

## 2022-06-07 DIAGNOSIS — Z9889 Other specified postprocedural states: Secondary | ICD-10-CM | POA: Diagnosis not present

## 2022-06-07 DIAGNOSIS — R109 Unspecified abdominal pain: Secondary | ICD-10-CM | POA: Diagnosis not present

## 2022-06-07 DIAGNOSIS — Z170 Estrogen receptor positive status [ER+]: Secondary | ICD-10-CM | POA: Insufficient documentation

## 2022-06-07 DIAGNOSIS — C50412 Malignant neoplasm of upper-outer quadrant of left female breast: Secondary | ICD-10-CM | POA: Insufficient documentation

## 2022-06-07 DIAGNOSIS — M549 Dorsalgia, unspecified: Secondary | ICD-10-CM | POA: Insufficient documentation

## 2022-06-07 DIAGNOSIS — R42 Dizziness and giddiness: Secondary | ICD-10-CM | POA: Diagnosis not present

## 2022-06-07 DIAGNOSIS — R519 Headache, unspecified: Secondary | ICD-10-CM | POA: Diagnosis not present

## 2022-06-07 MED ORDER — IOHEXOL 300 MG/ML  SOLN
100.0000 mL | Freq: Once | INTRAMUSCULAR | Status: AC | PRN
  Administered 2022-06-07: 100 mL via INTRAVENOUS

## 2022-06-07 MED ORDER — GADOBUTROL 1 MMOL/ML IV SOLN
6.0000 mL | Freq: Once | INTRAVENOUS | Status: AC | PRN
  Administered 2022-06-07: 6 mL via INTRAVENOUS

## 2022-06-07 MED ORDER — IOHEXOL 9 MG/ML PO SOLN
1000.0000 mL | Freq: Once | ORAL | Status: AC
  Administered 2022-06-07: 1000 mL via ORAL

## 2022-06-07 NOTE — Assessment & Plan Note (Signed)
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  0/1 lymph node negative, additional surgery 09/11/2019.  The margins Oncotype DX score 16 (risk of recurrence at 9 years: 15%) Adjuvant radiation 10/17/2019 11/13/2019   Current treatment: Exemestane (10/12/2019: Goserelin: Discontinued, anastrozole started 11/27/2019 discontinued 11/14/2020, BSO: 03/20/2020, exemestane started July 2022)   Exemestane toxicities: Tolerating the treatment extremely well.   Breast cancer surveillance: 1.  Breast exam 01/04/2022: Benign 2. mammogram    Left axillary nodule evaluation: Ria Comment ordered mammogram and ultrasound Sharp pain in the lower back: Exemestane is being held  Plan: CT chest abdomen pelvis and bone scan evaluation

## 2022-06-07 NOTE — Telephone Encounter (Signed)
Pt is scheduled for CT CAP tonight at 8 PM. Pt to arrive at [facility] main entrance for check in and have contrast at 6 PM & 7 PM. Pt knows she must be NPO 4 hr prior to CT. Once finished with CT, she will proceed to MRI brain. Pt has an understanding of these instructions and knows to call with any questions. Pt states she is aware MD will call her tonight with results.

## 2022-06-08 ENCOUNTER — Ambulatory Visit (HOSPITAL_BASED_OUTPATIENT_CLINIC_OR_DEPARTMENT_OTHER): Payer: BC Managed Care – PPO | Admitting: Hematology and Oncology

## 2022-06-08 ENCOUNTER — Ambulatory Visit: Payer: BC Managed Care – PPO | Admitting: Hematology and Oncology

## 2022-06-08 DIAGNOSIS — C50412 Malignant neoplasm of upper-outer quadrant of left female breast: Secondary | ICD-10-CM | POA: Diagnosis not present

## 2022-06-08 DIAGNOSIS — Z170 Estrogen receptor positive status [ER+]: Secondary | ICD-10-CM | POA: Diagnosis not present

## 2022-06-08 NOTE — Progress Notes (Signed)
HEMATOLOGY-ONCOLOGY TELEPHONE VISIT PROGRESS NOTE  I connected with our patient on 06/08/22 at  1:30 PM EST by telephone and verified that I am speaking with the correct person using two identifiers.  I discussed the limitations, risks, security and privacy concerns of performing an evaluation and management service by telephone and the availability of in person appointments.  I also discussed with the patient that there may be a patient responsible charge related to this service. The patient expressed understanding and agreed to proceed.   History of Present Illness: Robin Castillo is a 51 y.o with the above- mentioned history of breast cancer who is currently on exemestane. She presents to the clinic for a telephone follow-up.   Oncology History  Malignant neoplasm of upper-outer quadrant of left breast in female, estrogen receptor positive ([place])  08/09/2019 Genetic Testing   Negative genetic testing:  No pathogenic variants detected on the [facility] Breast Cancer STAT or Common Hereditary Cancers panel. A variant of uncertain significance was detected in the MSH3 gene called c.803G>A (p.Arg268Gln). The report date is 08/09/2019.  The STAT Breast cancer panel offered by [facility] includes sequencing and rearrangement analysis for the following 9 genes:  ATM, BRCA1, BRCA2, CDH1, CHEK2, PALB2, PTEN, STK11 and TP53.  The Common Hereditary Cancers Panel offered by [facility] includes sequencing and/or deletion duplication testing of the following 48 genes: APC, ATM, AXIN2, BARD1, BMPR1A, BRCA1, BRCA2, BRIP1, CDH1, CDK4, CDKN2A (p14ARF), CDKN2A (p16INK4a), CHEK2, CTNNA1, DICER1, EPCAM (Deletion/duplication testing only), GREM1 (promoter region deletion/duplication testing only), KIT, MEN1, MLH1, MSH2, MSH3, MSH6, MUTYH, NBN, NF1, NHTL1, PALB2, PDGFRA, PMS2, POLD1, POLE, PTEN, RAD50, RAD51C, RAD51D, RNF43, SDHB, SDHC, SDHD, SMAD4, SMARCA4. STK11, TP53, TSC1, TSC2, and VHL.  The following genes were evaluated for  sequence changes only: SDHA and HOXB13 c.251G>A variant only.    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 (a) a single sentinel lymph node was removed (b) additional surgery 09/11/2019 cleared the margins   08/21/2019 Cancer Staging   Staging form: Breast, AJCC 8th Edition - Pathologic stage from 08/21/2019: Stage IA (pT1c, pN72m, cM0, G2, ER+, PR+, HER2-) - Signed by [name] on 09/05/2019   07/2019 Oncotype testing   Oncotype score of 16 predicts a risk of recurrence outside the breast in the next 9 years of 15% if the patient's only systemic therapy is tamoxifen for 5 years   10/12/2019 -  Anti-estrogen oral therapy   (a) goserelin/Zoladex started 10/12/2019, discontinued after 02/29/2020 dose (b) anastrozole started 11/27/2019, discontinued May 2022 with multiple side effects (c) bilateral salpingo-oophorectomy 03/20/2020 (d) bone density 05/19/2020 shows a T score of -0.1 (normal). (e) exemestane started July 2022   10/17/2019 - 11/13/2019 Radiation Therapy   adjuvant radiation      REVIEW OF SYSTEMS:   Constitutional: Denies fevers, chills or abnormal weight loss All other systems were reviewed with the patient and are negative. 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  0/1 lymph node negative, additional surgery 09/11/2019.  The margins Oncotype DX score 16 (risk of recurrence at 9 years: 15%) Adjuvant radiation 10/17/2019 11/13/2019   Current treatment: Exemestane (10/12/2019: Goserelin: Discontinued, anastrozole started 11/27/2019  discontinued 11/14/2020, BSO: 03/20/2020, exemestane started July 2022)   Exemestane toxicities: Trigger finger Slight tingling   Breast cancer surveillance: 1.  Breast exam 01/04/2022: Benign 2. mammogram at end of Jan 3.  CT CAP 06/07/2022: No findings of metastatic disease.  5 x 3 mm groundglass density right upper lobe nonspecific benign, fatty liver 4.  Brain MRI 06/07/2022: No metastatic disease   Sharp pain in the lower back: Exemestane restarted again    I discussed the assessment and treatment plan with the patient. The patient was provided an opportunity to ask questions and all were answered. The patient agreed with the plan and demonstrated an understanding of the instructions. The patient was advised to call back or seek an in-person evaluation if the symptoms worsen or if the condition fails to improve as anticipated.   I provided 12 minutes of non-face-to-face time during this encounter.  This includes time for charting and coordination of care   Harriette Ohara, MD  I Gardiner Coins am scribing for Dr. Lindi Adie  I have reviewed the above documentation for accuracy and completeness, and I agree with the above.

## 2022-06-08 NOTE — Assessment & Plan Note (Addendum)
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  0/1 lymph node negative, additional surgery 09/11/2019.  The margins Oncotype DX score 16 (risk of recurrence at 9 years: 15%) Adjuvant radiation 10/17/2019 11/13/2019   Current treatment: Exemestane (10/12/2019: Goserelin: Discontinued, anastrozole started 11/27/2019 discontinued 11/14/2020, BSO: 03/20/2020, exemestane started July 2022)   Exemestane toxicities: Trigger finger Slight tingling   Breast cancer surveillance: 1.  Breast exam 01/04/2022: Benign 2. mammogram at end of Jan 3.  CT CAP 06/07/2022: No findings of metastatic disease.  5 x 3 mm groundglass density right upper lobe nonspecific benign, fatty liver 4.  Brain MRI 06/07/2022: No metastatic disease   Sharp pain in the lower back: Exemestane restarted again 

## 2022-07-05 ENCOUNTER — Ambulatory Visit: Payer: BC Managed Care – PPO | Attending: Radiation Oncology

## 2022-07-05 VITALS — Wt 133.0 lb

## 2022-07-05 DIAGNOSIS — Z483 Aftercare following surgery for neoplasm: Secondary | ICD-10-CM | POA: Insufficient documentation

## 2022-07-05 NOTE — Therapy (Signed)
OUTPATIENT PHYSICAL THERAPY SOZO SCREENING NOTE   Patient Name: Robin Castillo MRN: [id number] DOB:06/09/1971, 52 y.o., female Today's Date: 07/05/2022  PCP: [name], MD REFERRING PROVIDER: [name], MD   PT End of Session - 07/05/22 [phone]     Visit Number 2   # unchanged due to screen only   PT Start Time 7673    PT Stop Time 0949    PT Time Calculation (min) 8 min    Activity Tolerance Patient tolerated treatment well    Behavior During Therapy [facility] for tasks assessed/performed             Past Medical History:  Diagnosis Date   Arthritis    per patient new onset in finger joints   BV (bacterial vaginosis) 2012   Cancer ([place]) 2021   left breast    Cyst of ovary, right 2003   Family history of prostate cancer in father    H/O rubella    H/O varicella as child   History of PCOS 06/2005   Hypertension    Infertility, female    Irregular menses 11/2001   Migraine    Pregnancy induced hypertension    Psoriasis    Psychosocial stressors 10/2009   Past Surgical History:  Procedure Laterality Date   BREAST ENHANCEMENT SURGERY Bilateral 2013   BREAST LUMPECTOMY WITH RADIOACTIVE SEED AND SENTINEL LYMPH NODE BIOPSY Left 08/21/2019   Procedure: LEFT BREAST LUMPECTOMY WITH RADIOACTIVE SEED AND LEFT AXILLARY SENTINEL LYMPH NODE BIOPSY;  Surgeon: [name], MD;  Location: [place];  Service: General;  Laterality: Left;   BREAST SURGERY     CESAREAN SECTION     x 1    RE-EXCISION OF BREAST LUMPECTOMY Left 09/11/2019   Procedure: LEFT RE-EXCISION OF BREAST MARGIN;  Surgeon: [name], MD;  Location: [place];  Service: General;  Laterality: Left;   ROBOTIC ASSISTED BILATERAL SALPINGO OOPHERECTOMY Bilateral 03/20/2020   Procedure: XI ROBOTIC ASSISTED BILATERAL SALPINGO OOPHORECTOMY;  Surgeon: [name], MD;  Location: [place];  Service: Gynecology;  Laterality: Bilateral;  Dr. [name] requesting 2 / 2  1/2 hours   Patient Active Problem List   Diagnosis Date Noted   Psoriasis 05/05/2021   B12 deficiency 06/09/2020   Genetic testing 08/10/2019   Family history of prostate cancer in father    Malignant neoplasm of upper-outer quadrant of left breast in female, estrogen receptor positive ([place]) 07/30/2019    REFERRING DIAG: left breast cancer at risk for lymphedema  THERAPY DIAG: Aftercare following surgery for neoplasm  PERTINENT HISTORY: Patient was diagnosed on 07/25/2019 with left grade II invasive ductal carcinoma breast cancer. Patient reports she underwent a left lumpectomy and sentinel node biopsy (1 node removed with micromets) on 08/21/2019. It is ER/PR positive and HER2 negative with a Ki67 of 2%.She had 20 radiation treatments with booster past histroy of breat implants in 2013, possible psoriatic arthritis and possible carpal tunnel on the right   PRECAUTIONS: left UE Lymphedema risk, None  SUBJECTIVE: Pt returns for her 6 month L-Dex screen.  PAIN:  Are you having pain? No  SOZO SCREENING: Patient was assessed today using the SOZO machine to determine the lymphedema index score. This was compared to her baseline score. It was determined that she is within the recommended range when compared to her baseline and no further action is needed at this time. She will continue SOZO screenings. These are done every 3 months for 2 years post operatively  followed by every 6 months for 2 years, and then annually.   L-DEX FLOWSHEETS - 07/05/22 0900       L-DEX LYMPHEDEMA SCREENING   Measurement Type Unilateral    L-DEX MEASUREMENT EXTREMITY Upper Extremity    POSITION  Standing    DOMINANT SIDE Right    At Risk Side Left    BASELINE SCORE (UNILATERAL) -0.8    L-DEX SCORE (UNILATERAL) -3.8    VALUE CHANGE (UNILAT) -3              [name], PTA 07/05/2022, 9:49 AM

## 2022-07-07 ENCOUNTER — Encounter (HOSPITAL_BASED_OUTPATIENT_CLINIC_OR_DEPARTMENT_OTHER): Payer: Self-pay

## 2022-07-07 ENCOUNTER — Ambulatory Visit (HOSPITAL_BASED_OUTPATIENT_CLINIC_OR_DEPARTMENT_OTHER)
Admission: RE | Admit: 2022-07-07 | Discharge: 2022-07-07 | Disposition: A | Discharge status: Home / Self Care | Payer: BC Managed Care – PPO | Source: Ambulatory Visit | Attending: Hematology and Oncology | Admitting: Hematology and Oncology

## 2022-07-07 DIAGNOSIS — Z780 Asymptomatic menopausal state: Secondary | ICD-10-CM

## 2022-07-07 DIAGNOSIS — Z170 Estrogen receptor positive status [ER+]: Secondary | ICD-10-CM

## 2022-07-26 ENCOUNTER — Ambulatory Visit
Admission: RE | Admit: 2022-07-26 | Discharge: 2022-07-26 | Disposition: A | Discharge status: Home / Self Care | Payer: BC Managed Care – PPO | Source: Ambulatory Visit | Attending: Hematology and Oncology | Admitting: Hematology and Oncology

## 2022-07-26 ENCOUNTER — Other Ambulatory Visit: Payer: Self-pay | Admitting: Obstetrics and Gynecology

## 2022-07-26 DIAGNOSIS — Z9889 Other specified postprocedural states: Secondary | ICD-10-CM

## 2022-07-26 DIAGNOSIS — C50412 Malignant neoplasm of upper-outer quadrant of left female breast: Secondary | ICD-10-CM

## 2022-07-26 DIAGNOSIS — Z780 Asymptomatic menopausal state: Secondary | ICD-10-CM

## 2022-08-06 ENCOUNTER — Other Ambulatory Visit: Payer: Self-pay | Admitting: Internal Medicine

## 2022-08-06 ENCOUNTER — Ambulatory Visit
Admission: RE | Admit: 2022-08-06 | Discharge: 2022-08-06 | Disposition: A | Discharge status: Home / Self Care | Payer: BC Managed Care – PPO | Source: Ambulatory Visit | Attending: Internal Medicine | Admitting: Internal Medicine

## 2022-08-06 DIAGNOSIS — R918 Other nonspecific abnormal finding of lung field: Secondary | ICD-10-CM

## 2022-08-17 ENCOUNTER — Telehealth: Payer: Self-pay

## 2022-08-17 NOTE — Assessment & Plan Note (Signed)
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  0/1 lymph node negative, additional surgery 09/11/2019.  The margins Oncotype DX score 16 (risk of recurrence at 9 years: 15%) Adjuvant radiation 10/17/2019 11/13/2019   Current treatment: Exemestane (10/12/2019: Goserelin: Discontinued, anastrozole started 11/27/2019 discontinued 11/14/2020, BSO: 03/20/2020, exemestane started July 2022)   Exemestane toxicities: Trigger finger Slight tingling   Breast cancer surveillance: 1.  Breast exam 01/04/2022: Benign 2. mammogram at end of Jan 3.  CT CAP 06/07/2022: No findings of metastatic disease.  5 x 3 mm groundglass density right upper lobe nonspecific benign, fatty liver 4.  Brain MRI 06/07/2022: No metastatic disease   Sharp pain in the lower back: Exemestane restarted again

## 2022-08-17 NOTE — Telephone Encounter (Signed)
Pt called voicing concern about her most recent CXR. She is highly concerned about CXR results as compared to most recent CT chest. She would like MD to review this with her and wants to know if he recommends f/u CT. She verbalizes anxiety in regards to this, stating she is worried about lung cancer. She will have phone visit with MD 08/18/22 at [place].

## 2022-08-18 ENCOUNTER — Inpatient Hospital Stay: Payer: BC Managed Care – PPO | Attending: Hematology and Oncology | Admitting: Hematology and Oncology

## 2022-08-18 DIAGNOSIS — Z170 Estrogen receptor positive status [ER+]: Secondary | ICD-10-CM | POA: Diagnosis not present

## 2022-08-18 DIAGNOSIS — C50412 Malignant neoplasm of upper-outer quadrant of left female breast: Secondary | ICD-10-CM

## 2022-08-18 NOTE — Progress Notes (Signed)
HEMATOLOGY-ONCOLOGY TELEPHONE VISIT PROGRESS NOTE  I connected with our patient on 08/18/22 at  8:45 AM EST by telephone and verified that I am speaking with the correct person using two identifiers.  I discussed the limitations, risks, security and privacy concerns of performing an evaluation and management service by telephone and the availability of in person appointments.  I also discussed with the patient that there may be a patient responsible charge related to this service. The patient expressed understanding and agreed to proceed.   History of Present Illness: Telephone visit to discuss recent chest x-ray findings of persistent lung nodules by her primary care physician.  Patient is severely anxious and worried that this could be something more significant and wanted to discuss the surveillance plan for the nodule.  She is going through a divorce and it has affected her severely and she is on antianxiety medications currently.  Oncology History  Malignant neoplasm of upper-outer quadrant of left breast in female, estrogen receptor positive ([place])  08/09/2019 Genetic Testing   Negative genetic testing:  No pathogenic variants detected on the [facility] Breast Cancer STAT or Common Hereditary Cancers panel. A variant of uncertain significance was detected in the MSH3 gene called c.803G>A (p.Arg268Gln). The report date is 08/09/2019.  The STAT Breast cancer panel offered by [facility] includes sequencing and rearrangement analysis for the following 9 genes:  ATM, BRCA1, BRCA2, CDH1, CHEK2, PALB2, PTEN, STK11 and TP53.  The Common Hereditary Cancers Panel offered by [facility] includes sequencing and/or deletion duplication testing of the following 48 genes: APC, ATM, AXIN2, BARD1, BMPR1A, BRCA1, BRCA2, BRIP1, CDH1, CDK4, CDKN2A (p14ARF), CDKN2A (p16INK4a), CHEK2, CTNNA1, DICER1, EPCAM (Deletion/duplication testing only), GREM1 (promoter region deletion/duplication testing only), KIT, MEN1, MLH1, MSH2, MSH3,  MSH6, MUTYH, NBN, NF1, NHTL1, PALB2, PDGFRA, PMS2, POLD1, POLE, PTEN, RAD50, RAD51C, RAD51D, RNF43, SDHB, SDHC, SDHD, SMAD4, SMARCA4. STK11, TP53, TSC1, TSC2, and VHL.  The following genes were evaluated for sequence changes only: SDHA and HOXB13 c.251G>A variant only.    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 (a) a single sentinel lymph node was removed (b) additional surgery 09/11/2019 cleared the margins   08/21/2019 Cancer Staging   Staging form: Breast, AJCC 8th Edition - Pathologic stage from 08/21/2019: Stage IA (pT1c, pN58m, cM0, G2, ER+, PR+, HER2-) - Signed by [name] on 09/05/2019   07/2019 Oncotype testing   Oncotype score of 16 predicts a risk of recurrence outside the breast in the next 9 years of 15% if the patient's only systemic therapy is tamoxifen for 5 years   10/12/2019 -  Anti-estrogen oral therapy   (a) goserelin/Zoladex started 10/12/2019, discontinued after 02/29/2020 dose (b) anastrozole started 11/27/2019, discontinued May 2022 with multiple side effects (c) bilateral salpingo-oophorectomy 03/20/2020 (d) bone density 05/19/2020 shows a T score of -0.1 (normal). (e) exemestane started July 2022   10/17/2019 - 11/13/2019 Radiation Therapy   adjuvant radiation      REVIEW OF SYSTEMS:   Constitutional: Denies fevers, chills or abnormal weight loss All other systems were reviewed with the patient and are negative. Observations/Objective:     Assessment Plan:  Malignant neoplasm of upper-outer quadrant of left breast in female, estrogen receptor positive ([place] 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  0/1 lymph node negative,  additional surgery 09/11/2019.  The  margins Oncotype DX score 16 (risk of recurrence at 9 years: 15%) Adjuvant radiation 10/17/2019 11/13/2019   Current treatment: Exemestane (10/12/2019: Goserelin: Discontinued, anastrozole started 11/27/2019 discontinued 11/14/2020, BSO: 03/20/2020, exemestane started July 2022)   Exemestane toxicities: Trigger finger Slight tingling   Breast cancer surveillance: 1.  Breast exam 01/04/2022: Benign 2. mammogram at end of Jan 3.  CT CAP 06/07/2022: No findings of metastatic disease.  5 x 3 mm groundglass density right upper lobe nonspecific benign, fatty liver 4.  Brain MRI 06/07/2022: No metastatic disease   Sharp pain in the lower back: Exemestane restarted again Severe Anxiety: going through a divorce. Stressed about the nodule. Her PCP started her on Trentellix. Lung nodule: recheck with CT chest in 1 month. Telephone call 1 day after scan.  I discussed the assessment and treatment plan with the patient. The patient was provided an opportunity to ask questions and all were answered. The patient agreed with the plan and demonstrated an understanding of the instructions. The patient was advised to call back or seek an in-person evaluation if the symptoms worsen or if the condition fails to improve as anticipated.   I provided 12 minutes of non-face-to-face time during this encounter.  This includes time for charting and coordination of care.   Robin Ohara, MD

## 2022-09-16 ENCOUNTER — Encounter (HOSPITAL_COMMUNITY): Payer: Self-pay

## 2022-09-16 ENCOUNTER — Ambulatory Visit (HOSPITAL_COMMUNITY)
Admission: RE | Admit: 2022-09-16 | Discharge: 2022-09-16 | Disposition: A | Discharge status: Home / Self Care | Payer: BC Managed Care – PPO | Source: Ambulatory Visit | Attending: Hematology and Oncology | Admitting: Hematology and Oncology

## 2022-09-16 DIAGNOSIS — Z170 Estrogen receptor positive status [ER+]: Secondary | ICD-10-CM | POA: Insufficient documentation

## 2022-09-16 DIAGNOSIS — C50412 Malignant neoplasm of upper-outer quadrant of left female breast: Secondary | ICD-10-CM | POA: Insufficient documentation

## 2022-09-16 MED ORDER — SODIUM CHLORIDE (PF) 0.9 % IJ SOLN
INTRAMUSCULAR | Status: AC
  Filled 2022-09-16: qty 50

## 2022-09-16 MED ORDER — IOHEXOL 300 MG/ML  SOLN
75.0000 mL | Freq: Once | INTRAMUSCULAR | Status: AC | PRN
  Administered 2022-09-16: 75 mL via INTRAVENOUS

## 2022-09-20 ENCOUNTER — Telehealth: Payer: Self-pay | Admitting: *Deleted

## 2022-09-20 ENCOUNTER — Other Ambulatory Visit: Payer: Self-pay | Admitting: *Deleted

## 2022-09-20 DIAGNOSIS — R911 Solitary pulmonary nodule: Secondary | ICD-10-CM

## 2022-09-20 NOTE — Telephone Encounter (Signed)
RN placed call to pt regarding recent CT scan.  Pt high anxiety with ongoing 5 mm mass on right upper lobe.  RN reviewed with NP and verbal orders received to place referral to [facility]. Referral placed and RN placed call to confirm referral was received, verbalized they received referral and will work on scheduling pt.

## 2022-10-01 ENCOUNTER — Ambulatory Visit: Payer: BC Managed Care – PPO | Admitting: Pulmonary Disease

## 2022-10-01 ENCOUNTER — Encounter: Payer: Self-pay | Admitting: Pulmonary Disease

## 2022-10-01 VITALS — BP 150/90 | HR 82 | Ht 65.0 in | Wt 141.8 lb

## 2022-10-01 DIAGNOSIS — R918 Other nonspecific abnormal finding of lung field: Secondary | ICD-10-CM | POA: Diagnosis not present

## 2022-10-01 DIAGNOSIS — R911 Solitary pulmonary nodule: Secondary | ICD-10-CM

## 2022-10-01 NOTE — Patient Instructions (Addendum)
  Thank you for visiting Dr. Tonia Brooms at [facility]. Today we recommend the following:  Orders Placed This Encounter  Procedures   CT Chest Wo Contrast   Return in about 6 months (around 04/02/2023) for w/ Aubreyana Saltz , after CT Chest.    Please do your part to reduce the spread of COVID-19.

## 2022-10-01 NOTE — Progress Notes (Signed)
Synopsis: Referred in   April 2024 for pulmonary nodule by Lillard Anes Cornett*  Subjective:   PATIENT ID: Robin Castillo GENDER: female DOB: 1970-09-16, MRN: [id number]  Chief Complaint  Patient presents with   Consult    Lung nodule.    This is a 52 year old female seen today for evaluation of an abnormal CT chest with a right upper lobe 5 mm groundglass lesion.  It was initially found to December with of CT follow-up 3 months later that shows persistence of the lesion.  She is very anxious about a potential malignant history as she does have a history of left-sided breast cancer diagnosed in 2021.  She also had antiestrogen therapy at the time as well as a bilateral salpingo-oophorectomy.  Very anxious today long discussion regarding next steps regarding groundglass nodules and their potential for malignancy.  She does use a sauna during the week.  But no other exposures from respiratory system that she knows of.    Oncology History  Malignant neoplasm of upper-outer quadrant of left breast in female, estrogen receptor positive  08/09/2019 Genetic Testing   Negative genetic testing:  No pathogenic variants detected on the [facility] Breast Cancer STAT or Common Hereditary Cancers panel. A variant of uncertain significance was detected in the MSH3 gene called c.803G>A (p.Arg268Gln). The report date is 08/09/2019.  The STAT Breast cancer panel offered by [facility] includes sequencing and rearrangement analysis for the following 9 genes:  ATM, BRCA1, BRCA2, CDH1, CHEK2, PALB2, PTEN, STK11 and TP53.  The Common Hereditary Cancers Panel offered by [facility] includes sequencing and/or deletion duplication testing of the following 48 genes: APC, ATM, AXIN2, BARD1, BMPR1A, BRCA1, BRCA2, BRIP1, CDH1, CDK4, CDKN2A (p14ARF), CDKN2A (p16INK4a), CHEK2, CTNNA1, DICER1, EPCAM (Deletion/duplication testing only), GREM1 (promoter region deletion/duplication testing only), KIT, MEN1, MLH1, MSH2, MSH3, MSH6,  MUTYH, NBN, NF1, NHTL1, PALB2, PDGFRA, PMS2, POLD1, POLE, PTEN, RAD50, RAD51C, RAD51D, RNF43, SDHB, SDHC, SDHD, SMAD4, SMARCA4. STK11, TP53, TSC1, TSC2, and VHL.  The following genes were evaluated for sequence changes only: SDHA and HOXB13 c.251G>A variant only.    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 (a) a single sentinel lymph node was removed (b) additional surgery 09/11/2019 cleared the margins   08/21/2019 Cancer Staging   Staging form: Breast, AJCC 8th Edition - Pathologic stage from 08/21/2019: Stage IA (pT1c, pN64mi, cM0, G2, ER+, PR+, HER2-) - Signed by Loa Socks, NP on 09/05/2019   07/2019 Oncotype testing   Oncotype score of 16 predicts a risk of recurrence outside the breast in the next 9 years of 15% if the patient's only systemic therapy is tamoxifen for 5 years   10/12/2019 -  Anti-estrogen oral therapy   (a) goserelin/Zoladex started 10/12/2019, discontinued after 02/29/2020 dose (b) anastrozole started 11/27/2019, discontinued May 2022 with multiple side effects (c) bilateral salpingo-oophorectomy 03/20/2020 (d) bone density 05/19/2020 shows a T score of -0.1 (normal). (e) exemestane started July 2022   10/17/2019 - 11/13/2019 Radiation Therapy   adjuvant radiation       Past Medical History:  Diagnosis Date   Arthritis    per patient new onset in finger joints   BV (bacterial vaginosis) 2012   Cancer 2021   left breast    Cyst of ovary, right 2003   Family history of prostate cancer in father    H/O rubella    H/O  varicella as child   History of PCOS 06/2005   Hypertension    Infertility, female    Irregular menses 11/2001   Migraine    Pregnancy induced hypertension    Psoriasis    Psychosocial stressors 10/2009     Family History  Problem Relation Age of Onset   Heart disease Mother     Hypertension Mother    Heart disease Father    Depression Father    Alcohol abuse Father    Prostate cancer Father [phone]   Hypertension Sister    Hypercholesterolemia Sister    Heart disease Maternal Grandmother    Heart disease Maternal Grandfather    Healthy Son    Healthy Son      Past Surgical History:  Procedure Laterality Date   BREAST ENHANCEMENT SURGERY Bilateral 2013   BREAST LUMPECTOMY WITH RADIOACTIVE SEED AND SENTINEL LYMPH NODE BIOPSY Left 08/21/2019   Procedure: LEFT BREAST LUMPECTOMY WITH RADIOACTIVE SEED AND LEFT AXILLARY SENTINEL LYMPH NODE BIOPSY;  Surgeon: Emelia LoronWakefield, Matthew, MD;  Location: [facility];  Service: General;  Laterality: Left;   BREAST SURGERY     CESAREAN SECTION     x 1    RE-EXCISION OF BREAST LUMPECTOMY Left 09/11/2019   Procedure: LEFT RE-EXCISION OF BREAST MARGIN;  Surgeon: Emelia LoronWakefield, Matthew, MD;  Location: [facility];  Service: General;  Laterality: Left;   ROBOTIC ASSISTED BILATERAL SALPINGO OOPHERECTOMY Bilateral 03/20/2020   Procedure: XI ROBOTIC ASSISTED BILATERAL SALPINGO OOPHORECTOMY;  Surgeon: Silverio Layivard, Sandra, MD;  Location: [facility];  Service: Gynecology;  Laterality: Bilateral;  Dr. Estanislado Pandyivard requesting 2 / 2 1/2 hours    Social History   Socioeconomic History   Marital status: Married    Spouse name: Not on file   Number of children: Not on file   Years of education: Not on file   Highest education level: Not on file  Occupational History   Not on file  Tobacco Use   Smoking status: Never   Smokeless tobacco: Never  Vaping Use   Vaping Use: Never used  Substance and Sexual Activity   Alcohol use: Not Currently   Drug use: No   Sexual activity: Yes  Other Topics Concern   Not on file  Social History Narrative   Not on file   Social Determinants of Health   Financial Resource Strain: Not on file  Food Insecurity: Not on file  Transportation Needs: Not on file  Physical  Activity: Not on file  Stress: Not on file  Social Connections: Not on file  Intimate Partner Violence: Not on file     No Known Allergies   Outpatient Medications Prior to Visit  Medication Sig Dispense Refill   Ascorbic Acid (VITAMIN C) 1000 MG tablet Take 2 tablets (2,000 mg total) by mouth daily.     Azilsartan-Chlorthalidone (EDARBYCLOR) 40-12.5 MG TABS Take by mouth. 30 tablet    ergocalciferol (VITAMIN D2) 1.25 MG (50000 UT) capsule Take 1 capsule (50,000 Units total) by mouth once a week.     exemestane (AROMASIN) 25 MG tablet TAKE 1 TABLET DAILY AFTER  BREAKFAST 90 tablet 3   fluocinolone (VANOS) 0.01 % cream Apply topically as needed.     gabapentin (NEURONTIN) 300 MG capsule Take 1 capsule (300 mg total) by mouth at bedtime as needed. 90 capsule 3   NALTREXONE HCL PO Take 3 mg by mouth daily.     Omega-3 Fatty Acids (FISH OIL PO) Take by mouth daily.  Turmeric 500 MG TABS PATIENT TAKES IN DIET     vitamin B-12 (CYANOCOBALAMIN) 100 MCG tablet Take 1 tablet (100 mcg total) by mouth daily.     No facility-administered medications prior to visit.    Review of Systems  Constitutional:  Negative for chills, fever, malaise/fatigue and weight loss.  HENT:  Negative for hearing loss, sore throat and tinnitus.   Eyes:  Negative for blurred vision and double vision.  Respiratory:  Negative for cough, hemoptysis, sputum production, shortness of breath, wheezing and stridor.   Cardiovascular:  Negative for chest pain, palpitations, orthopnea, leg swelling and PND.  Gastrointestinal:  Negative for abdominal pain, constipation, diarrhea, heartburn, nausea and vomiting.  Genitourinary:  Negative for dysuria, hematuria and urgency.  Musculoskeletal:  Negative for joint pain and myalgias.  Skin:  Negative for itching and rash.  Neurological:  Negative for dizziness, tingling, weakness and headaches.  Endo/Heme/Allergies:  Negative for environmental allergies. Does not bruise/bleed  easily.  Psychiatric/Behavioral:  Negative for depression. The patient is nervous/anxious. The patient does not have insomnia.   All other systems reviewed and are negative.    Objective:  Physical Exam Vitals reviewed.  Constitutional:      General: She is not in acute distress.    Appearance: She is well-developed.  HENT:     Head: Normocephalic and atraumatic.  Eyes:     General: No scleral icterus.    Conjunctiva/sclera: Conjunctivae normal.     Pupils: Pupils are equal, round, and reactive to light.  Neck:     Vascular: No JVD.     Trachea: No tracheal deviation.  Cardiovascular:     Rate and Rhythm: Normal rate and regular rhythm.     Heart sounds: Normal heart sounds. No murmur heard. Pulmonary:     Effort: Pulmonary effort is normal. No tachypnea, accessory muscle usage or respiratory distress.     Breath sounds: Normal breath sounds. No stridor. No wheezing, rhonchi or rales.  Abdominal:     General: Bowel sounds are normal. There is no distension.     Palpations: Abdomen is soft.     Tenderness: There is no abdominal tenderness.  Musculoskeletal:        General: No tenderness.     Cervical back: Neck supple.  Lymphadenopathy:     Cervical: No cervical adenopathy.  Skin:    General: Skin is warm and dry.     Capillary Refill: Capillary refill takes less than 2 seconds.     Findings: No rash.  Neurological:     Mental Status: She is alert and oriented to person, place, and time.  Psychiatric:        Behavior: Behavior normal.     Comments:   Anxious      Vitals:   10/01/22 1540  BP: (!) 150/90  Pulse: 82  SpO2: 99%  Weight: 141 lb 12.8 oz (64.3 kg)  Height: 5\' 5"  (1.651 m)   99% on RA BMI Readings from Last 3 Encounters:  10/01/22 23.60 kg/m  07/05/22 22.13 kg/m  06/07/22 22.40 kg/m   Wt Readings from Last 3 Encounters:  10/01/22 141 lb 12.8 oz (64.3 kg)  07/05/22 133 lb (60.3 kg)  06/07/22 134 lb 9.6 oz (61.1 kg)     CBC    Component  Value Date/Time   WBC 3.3 (L) 07/09/2021 0945   RBC 4.32 07/09/2021 0945   HGB 12.6 07/09/2021 0945   HGB 11.2 (L) 08/01/2019 1234   HCT 38.6 07/09/2021 0945  PLT 230 07/09/2021 0945   PLT 313 08/01/2019 1234   MCV 89.4 07/09/2021 0945   MCH 29.2 07/09/2021 0945   MCHC 32.6 07/09/2021 0945   RDW 12.8 07/09/2021 0945   LYMPHSABS 1.3 07/09/2021 0945   MONOABS 0.2 07/09/2021 0945   EOSABS 0.1 07/09/2021 0945   BASOSABS 0.0 07/09/2021 0945     Chest Imaging:  December CT chest March 2024 CT chest: Reviewed CT imaging serially.  The right upper lobe 5 mm groundglass lesion is persistent throughout 3 months. The patient's images have been independently reviewed by me.    Pulmonary Functions Testing Results:     No data to display          FeNO:   Pathology:   Echocardiogram:   Heart Catheterization:     Assessment & Plan:     ICD-10-CM   1. Lung nodule  R91.1 CT Chest Wo Contrast    2. Ground glass opacity present on imaging of lung  R91.8 CT Chest Wo Contrast      Discussion:  This is a 52 year old female, groundglass lesion in the right upper lobe, lifelong non-smoker, she did have a few occasional cigarettes in college.  Otherwise never a daily smoker.  She has a history of breast cancer was found to have an incidental right upper lobe groundglass lesion that has been persistent for 3 months.  Plan: Today in the office we talked about the risk benefits and alternatives of consideration for conservative CT image follow-up.  We offered 79-month and 1 year CT follow-up. Patient has decided for 40-month CT follow-up. She is very anxious about this lesion and we decided that if there is any change or growth in the lesion would consider referral to thoracic surgery for dye marking and biopsy and/or resection. She understands that she would like to be aggressive with the lesion and have it removed if needed. I think that the most appropriate neck step is follow-up  imaging as most groundglass lesions resolve or disappear on their own and are inflammatory in nature and not low-grade malignancies. Patient is agreeable to this plan.  If lesion is still persistent after 6 months we will make next steps. Patient will schedule appointment to see me after that CT chest in 6 months.    Current Outpatient Medications:    Ascorbic Acid (VITAMIN C) 1000 MG tablet, Take 2 tablets (2,000 mg total) by mouth daily., Disp: , Rfl:    Azilsartan-Chlorthalidone (EDARBYCLOR) 40-12.5 MG TABS, Take by mouth., Disp: 30 tablet, Rfl:    ergocalciferol (VITAMIN D2) 1.25 MG (50000 UT) capsule, Take 1 capsule (50,000 Units total) by mouth once a week., Disp: , Rfl:    exemestane (AROMASIN) 25 MG tablet, TAKE 1 TABLET DAILY AFTER  BREAKFAST, Disp: 90 tablet, Rfl: 3   fluocinolone (VANOS) 0.01 % cream, Apply topically as needed., Disp: , Rfl:    gabapentin (NEURONTIN) 300 MG capsule, Take 1 capsule (300 mg total) by mouth at bedtime as needed., Disp: 90 capsule, Rfl: 3   NALTREXONE HCL PO, Take 3 mg by mouth daily., Disp: , Rfl:    Omega-3 Fatty Acids (FISH OIL PO), Take by mouth daily., Disp: , Rfl:    Turmeric 500 MG TABS, PATIENT TAKES IN DIET, Disp: , Rfl:    vitamin B-12 (CYANOCOBALAMIN) 100 MCG tablet, Take 1 tablet (100 mcg total) by mouth daily., Disp: , Rfl:    Josephine Igo, DO [facility] 10/01/2022 5:33 PM

## 2022-10-06 ENCOUNTER — Telehealth: Payer: Self-pay | Admitting: Internal Medicine

## 2022-10-06 NOTE — Telephone Encounter (Signed)
Patient has breast cancer and a 58mm nodule . Dr Pamelia Hoit breast cancer doctor says patient wants to speciically follow with me. Ok to give first avail followup

## 2022-10-06 NOTE — Telephone Encounter (Signed)
-----   Message from Loa Socks, NP sent at 09/20/2022 11:12 AM EDT ----- FYI--this patient wants second opinion of her CT chest with pulmonology and requested one of you :) ----- Message ----- From: Mauri Pole, RN Sent: 09/20/2022  10:58 AM EDT To: Loa Socks, NP; Chcc Bc 4  Mardella Layman, she has HIGH anxiety and wants a referral to a pulmonologist just for a second opinion to make sure everything is okay.  I put one in to [place].  She is hoping to see Bensyn Bornemann or Icard.   Adelina Mings  ----- Message ----- From: Loa Socks, NP Sent: 09/20/2022   9:15 AM EDT To: [facility] Bc 4  Please let patient know that CT scan was negative for any metastatic cancer.  She continues to have the same lung nodule that is completely unchanged and likely benign.  We will need to continue to follow it. ----- Message ----- From: Interface, Rad Results In Sent: 09/18/2022   1:30 AM EDT To: Serena Croissant, MD

## 2022-10-06 NOTE — Telephone Encounter (Signed)
Front desk , she has 39mm nodule. She asked for me or Icard . Icard better fit due to nodule

## 2022-10-11 ENCOUNTER — Other Ambulatory Visit: Payer: Self-pay | Admitting: Internal Medicine

## 2022-10-11 DIAGNOSIS — R011 Cardiac murmur, unspecified: Secondary | ICD-10-CM

## 2022-10-12 ENCOUNTER — Ambulatory Visit: Payer: BC Managed Care – PPO

## 2022-10-12 ENCOUNTER — Encounter: Payer: Self-pay | Admitting: Hematology and Oncology

## 2022-10-12 DIAGNOSIS — R011 Cardiac murmur, unspecified: Secondary | ICD-10-CM

## 2023-01-03 ENCOUNTER — Ambulatory Visit: Payer: BC Managed Care – PPO | Attending: Radiation Oncology

## 2023-01-03 VITALS — Wt 142.4 lb

## 2023-01-03 DIAGNOSIS — Z483 Aftercare following surgery for neoplasm: Secondary | ICD-10-CM | POA: Insufficient documentation

## 2023-01-03 NOTE — Therapy (Signed)
OUTPATIENT PHYSICAL THERAPY SOZO SCREENING NOTE   Patient Name: MONZERRAT HUBANKS MRN: [id number] DOB:1971/04/10, 52 y.o., female Today's Date: 01/03/2023  PCP: Ralene Ok, MD REFERRING PROVIDER: Ronny Bacon,*   PT End of Session - 01/03/23 [phone]     Visit Number 2   # unchanged due to screen only   PT Start Time 0939    PT Stop Time 0943    PT Time Calculation (min) 4 min    Activity Tolerance Patient tolerated treatment well    Behavior During Therapy [facility] for tasks assessed/performed             Past Medical History:  Diagnosis Date   Arthritis    per patient new onset in finger joints   BV (bacterial vaginosis) 2012   Cancer (HCC) 2021   left breast    Cyst of ovary, right 2003   Family history of prostate cancer in father    H/O rubella    H/O varicella as child   History of PCOS 06/2005   Hypertension    Infertility, female    Irregular menses 11/2001   Migraine    Pregnancy induced hypertension    Psoriasis    Psychosocial stressors 10/2009   Past Surgical History:  Procedure Laterality Date   BREAST ENHANCEMENT SURGERY Bilateral 2013   BREAST LUMPECTOMY WITH RADIOACTIVE SEED AND SENTINEL LYMPH NODE BIOPSY Left 08/21/2019   Procedure: LEFT BREAST LUMPECTOMY WITH RADIOACTIVE SEED AND LEFT AXILLARY SENTINEL LYMPH NODE BIOPSY;  Surgeon: Emelia Loron, MD;  Location: [facility];  Service: General;  Laterality: Left;   BREAST SURGERY     CESAREAN SECTION     x 1    RE-EXCISION OF BREAST LUMPECTOMY Left 09/11/2019   Procedure: LEFT RE-EXCISION OF BREAST MARGIN;  Surgeon: Emelia Loron, MD;  Location: [facility];  Service: General;  Laterality: Left;   ROBOTIC ASSISTED BILATERAL SALPINGO OOPHERECTOMY Bilateral 03/20/2020   Procedure: XI ROBOTIC ASSISTED BILATERAL SALPINGO OOPHORECTOMY;  Surgeon: Silverio Lay, MD;  Location:  S[facility];  Service: Gynecology;  Laterality: Bilateral;  Dr. Estanislado Pandy  requesting 2 / 2 1/2 hours   Patient Active Problem List   Diagnosis Date Noted   Psoriasis 05/05/2021   B12 deficiency 06/09/2020   Genetic testing 08/10/2019   Family history of prostate cancer in father    Malignant neoplasm of upper-outer quadrant of left breast in female, estrogen receptor positive (HCC) 07/30/2019    REFERRING DIAG: left breast cancer at risk for lymphedema  THERAPY DIAG: Aftercare following surgery for neoplasm  PERTINENT HISTORY: Patient was diagnosed on 07/25/2019 with left grade II invasive ductal carcinoma breast cancer. Patient reports she underwent a left lumpectomy and sentinel node biopsy (1 node removed with micromets) on 08/21/2019. It is ER/PR positive and HER2 negative with a Ki67 of 2%.She had 20 radiation treatments with booster past histroy of breat implants in 2013, possible psoriatic arthritis and possible carpal tunnel on the right   PRECAUTIONS: left UE Lymphedema risk, None  SUBJECTIVE: Pt returns for her 6 month L-Dex screen. "I've been doing great and think Im going to make this my last one."  PAIN:  Are you having pain? No  SOZO SCREENING: Patient was assessed today using the SOZO machine to determine the lymphedema index score. This was compared to her baseline score. It was determined that she is within the recommended range when compared to her baseline and no further action is needed at this time. She  will continue SOZO screenings. These are done every 3 months for 2 years post operatively followed by every 6 months for 2 years, and then annually.   L-DEX FLOWSHEETS - 01/03/23 0900       L-DEX LYMPHEDEMA SCREENING   Measurement Type Unilateral    L-DEX MEASUREMENT EXTREMITY Upper Extremity    POSITION  Standing    DOMINANT SIDE Right    At Risk Side Left    BASELINE SCORE (UNILATERAL) -0.8    L-DEX SCORE (UNILATERAL) -2    VALUE CHANGE (UNILAT) -1.2              Hermenia Bers, PTA 01/03/2023, 9:44 AM

## 2023-01-03 NOTE — Progress Notes (Signed)
Patient Care Team: Ralene Ok, MD as PCP - General (Internal Medicine) Dorothy Puffer, MD as Consulting Physician (Radiation Oncology) Emelia Loron, MD as Consulting Physician (General Surgery) Lyn Records, MD (Inactive) as Consulting Physician (Cardiology) Dairl Ponder, MD as Consulting Physician (Orthopedic Surgery)  DIAGNOSIS: No diagnosis found.  SUMMARY OF ONCOLOGIC HISTORY: Oncology History  Malignant neoplasm of upper-outer quadrant of left breast in female, estrogen receptor positive (HCC)  08/09/2019 Genetic Testing   Negative genetic testing:  No pathogenic variants detected on the [facility] Breast Cancer STAT or Common Hereditary Cancers panel. A variant of uncertain significance was detected in the MSH3 gene called c.803G>A (p.Arg268Gln). The report date is 08/09/2019.  The STAT Breast cancer panel offered by [facility] includes sequencing and rearrangement analysis for the following 9 genes:  ATM, BRCA1, BRCA2, CDH1, CHEK2, PALB2, PTEN, STK11 and TP53.  The Common Hereditary Cancers Panel offered by [facility] includes sequencing and/or deletion duplication testing of the following 48 genes: APC, ATM, AXIN2, BARD1, BMPR1A, BRCA1, BRCA2, BRIP1, CDH1, CDK4, CDKN2A (p14ARF), CDKN2A (p16INK4a), CHEK2, CTNNA1, DICER1, EPCAM (Deletion/duplication testing only), GREM1 (promoter region deletion/duplication testing only), KIT, MEN1, MLH1, MSH2, MSH3, MSH6, MUTYH, NBN, NF1, NHTL1, PALB2, PDGFRA, PMS2, POLD1, POLE, PTEN, RAD50, RAD51C, RAD51D, RNF43, SDHB, SDHC, SDHD, SMAD4, SMARCA4. STK11, TP53, TSC1, TSC2, and VHL.  The following genes were evaluated for sequence changes only: SDHA and HOXB13 c.251G>A variant only.    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 (a) a single sentinel lymph node was removed (b) additional surgery  09/11/2019 cleared the margins   08/21/2019 Cancer Staging   Staging form: Breast, AJCC 8th Edition - Pathologic stage from 08/21/2019: Stage IA (pT1c, pN66mi, cM0, G2, ER+, PR+, HER2-) - Signed by Loa Socks, NP on 09/05/2019   07/2019 Oncotype testing   Oncotype score of 16 predicts a risk of recurrence outside the breast in the next 9 years of 15% if the patient's only systemic therapy is tamoxifen for 5 years   10/12/2019 -  Anti-estrogen oral therapy   (a) goserelin/Zoladex started 10/12/2019, discontinued after 02/29/2020 dose (b) anastrozole started 11/27/2019, discontinued May 2022 with multiple side effects (c) bilateral salpingo-oophorectomy 03/20/2020 (d) bone density 05/19/2020 shows a T score of -0.1 (normal). (e) exemestane started July 2022   10/17/2019 - 11/13/2019 Radiation Therapy   adjuvant radiation      CHIEF COMPLIANT:   INTERVAL HISTORY: Robin Castillo is a   ALLERGIES:  has No Known Allergies.  MEDICATIONS:  Current Outpatient Medications  Medication Sig Dispense Refill   Ascorbic Acid (VITAMIN C) 1000 MG tablet Take 2 tablets (2,000 mg total) by mouth daily.     Azilsartan-Chlorthalidone (EDARBYCLOR) 40-12.5 MG TABS Take by mouth. 30 tablet    ergocalciferol (VITAMIN D2) 1.25 MG (50000 UT) capsule Take 1 capsule (50,000 Units total) by mouth once a week.     exemestane (AROMASIN) 25 MG tablet TAKE 1 TABLET DAILY AFTER  BREAKFAST 90 tablet 3   fluocinolone (VANOS) 0.01 % cream Apply topically as needed.     gabapentin (NEURONTIN) 300 MG capsule Take 1 capsule (300 mg total) by mouth at bedtime as needed. 90 capsule 3   NALTREXONE HCL PO Take 3 mg by mouth daily.     Omega-3 Fatty Acids (FISH OIL PO) Take by mouth daily.     Turmeric 500 MG TABS PATIENT TAKES  IN DIET     vitamin B-12 (CYANOCOBALAMIN) 100 MCG tablet Take 1 tablet (100 mcg total) by mouth daily.     No current facility-administered medications for this visit.    PHYSICAL  EXAMINATION: ECOG PERFORMANCE STATUS: {CHL ONC ECOG PS:[phone]}  There were no vitals filed for this visit. There were no vitals filed for this visit.  BREAST:*** No palpable masses or nodules in either right or left breasts. No palpable axillary supraclavicular or infraclavicular adenopathy no breast tenderness or nipple discharge. (exam performed in the presence of a chaperone)  LABORATORY DATA:  I have reviewed the data as listed    Latest Ref Rng & Units 07/09/2021    9:45 AM 03/24/2021    8:45 AM 11/18/2020    8:38 AM  CMP  Glucose 70 - 99 mg/dL 80  87  91   BUN 6 - 20 mg/dL 6  10  10    Creatinine 0.44 - 1.00 mg/dL 1.61  0.96  0.45   Sodium 135 - 145 mmol/L 139  140  141   Potassium 3.5 - 5.1 mmol/L 3.8  3.8  3.5   Chloride 98 - 111 mmol/L 102  105  103   CO2 22 - 32 mmol/L 31  25  28    Calcium 8.9 - 10.3 mg/dL 40.9  9.8  81.1   Total Protein 6.5 - 8.1 g/dL 7.3  7.2  7.6   Total Bilirubin 0.3 - 1.2 mg/dL 0.6  0.7  0.7   Alkaline Phos 38 - 126 U/L 77  78  85   AST 15 - 41 U/L 16  16  19    ALT 0 - 44 U/L 14  13  17      Lab Results  Component Value Date   WBC 3.3 (L) 07/09/2021   HGB 12.6 07/09/2021   HCT 38.6 07/09/2021   MCV 89.4 07/09/2021   PLT 230 07/09/2021   NEUTROABS 1.7 07/09/2021    ASSESSMENT & PLAN:  No problem-specific Assessment & Plan notes found for this encounter.    No orders of the defined types were placed in this encounter.  The patient has a good understanding of the overall plan. she agrees with it. she will call with any problems that may develop before the next visit here. Total time spent: 30 mins including face to face time and time spent for planning, charting and co-ordination of care   Sherlyn Lick, CMA 01/03/23    I Janan Ridge am acting as a Neurosurgeon for [employer]  ***

## 2023-01-05 ENCOUNTER — Other Ambulatory Visit: Payer: Self-pay

## 2023-01-05 ENCOUNTER — Inpatient Hospital Stay: Payer: BC Managed Care – PPO | Attending: Hematology and Oncology | Admitting: Hematology and Oncology

## 2023-01-05 VITALS — BP 144/90 | HR 94 | Temp 97.2°F | Resp 19 | Ht 65.0 in | Wt 141.7 lb

## 2023-01-05 DIAGNOSIS — Z170 Estrogen receptor positive status [ER+]: Secondary | ICD-10-CM | POA: Insufficient documentation

## 2023-01-05 DIAGNOSIS — R911 Solitary pulmonary nodule: Secondary | ICD-10-CM | POA: Diagnosis not present

## 2023-01-05 DIAGNOSIS — R202 Paresthesia of skin: Secondary | ICD-10-CM | POA: Insufficient documentation

## 2023-01-05 DIAGNOSIS — C50412 Malignant neoplasm of upper-outer quadrant of left female breast: Secondary | ICD-10-CM | POA: Diagnosis present

## 2023-01-05 DIAGNOSIS — Z79811 Long term (current) use of aromatase inhibitors: Secondary | ICD-10-CM | POA: Diagnosis not present

## 2023-01-05 DIAGNOSIS — K760 Fatty (change of) liver, not elsewhere classified: Secondary | ICD-10-CM | POA: Diagnosis not present

## 2023-01-05 DIAGNOSIS — Z79899 Other long term (current) drug therapy: Secondary | ICD-10-CM | POA: Insufficient documentation

## 2023-01-05 DIAGNOSIS — J984 Other disorders of lung: Secondary | ICD-10-CM | POA: Insufficient documentation

## 2023-01-05 DIAGNOSIS — F419 Anxiety disorder, unspecified: Secondary | ICD-10-CM | POA: Diagnosis not present

## 2023-01-05 DIAGNOSIS — M6530 Trigger finger, unspecified finger: Secondary | ICD-10-CM | POA: Diagnosis not present

## 2023-01-05 NOTE — Assessment & Plan Note (Addendum)
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  0/1 lymph node negative, additional surgery 09/11/2019.  The margins Oncotype DX score 16 (risk of recurrence at 9 years: 15%) Adjuvant radiation 10/17/2019 11/13/2019   Current treatment: Exemestane (10/12/2019: Goserelin: Discontinued, anastrozole started 11/27/2019 discontinued 11/14/2020, BSO: 03/20/2020, exemestane started July 2022)   Exemestane toxicities: Trigger finger Slight tingling   Breast cancer surveillance: 1.  Breast exam 01/05/2023: Benign 2. mammogram 07/26/2022: Benign breast density category B 3.  CT CAP 06/07/2022: No findings of metastatic disease.  5 x 3 mm groundglass density right upper lobe nonspecific benign, fatty liver 4.  Brain MRI 06/07/2022: No metastatic disease  Severe Anxiety: going through a divorce. Lung nodule: Follows with pulmonary CT chest 09/18/2022: Persistent 5 mm right upper lobe nodule Repeat CT scan will be performed in September.  Telephone visit after that to discuss results.

## 2023-03-21 ENCOUNTER — Other Ambulatory Visit (HOSPITAL_BASED_OUTPATIENT_CLINIC_OR_DEPARTMENT_OTHER): Payer: BC Managed Care – PPO

## 2023-03-25 ENCOUNTER — Ambulatory Visit: Payer: BC Managed Care – PPO | Admitting: Pulmonary Disease

## 2023-03-28 ENCOUNTER — Ambulatory Visit (HOSPITAL_BASED_OUTPATIENT_CLINIC_OR_DEPARTMENT_OTHER)
Admission: RE | Admit: 2023-03-28 | Discharge: 2023-03-28 | Disposition: A | Discharge status: Home / Self Care | Payer: BC Managed Care – PPO | Source: Ambulatory Visit | Attending: Hematology and Oncology | Admitting: Hematology and Oncology

## 2023-03-28 DIAGNOSIS — Z170 Estrogen receptor positive status [ER+]: Secondary | ICD-10-CM | POA: Diagnosis present

## 2023-03-28 DIAGNOSIS — C50412 Malignant neoplasm of upper-outer quadrant of left female breast: Secondary | ICD-10-CM | POA: Insufficient documentation

## 2023-04-08 ENCOUNTER — Telehealth: Payer: Self-pay | Admitting: Hematology and Oncology

## 2023-04-08 NOTE — Telephone Encounter (Signed)
I informed the patient that the CT scan showed a 5 mm lung nodule is stable.  We may consider repeating another CT scan in 1 to 2 years.  I agree showed her that it is likely benign.

## 2023-04-11 ENCOUNTER — Other Ambulatory Visit: Payer: Self-pay | Admitting: Hematology and Oncology

## 2023-06-27 ENCOUNTER — Other Ambulatory Visit: Payer: Self-pay

## 2023-06-27 MED ORDER — EXEMESTANE 25 MG PO TABS: ORAL_TABLET | ORAL | 3 refills | Status: DC

## 2023-06-27 NOTE — Telephone Encounter (Signed)
Patient called in to have refill request sent to [facility]. Refill request sent. Lorayne Marek, RN

## 2023-07-29 ENCOUNTER — Other Ambulatory Visit: Payer: Self-pay | Admitting: Hematology and Oncology

## 2023-07-29 DIAGNOSIS — Z1231 Encounter for screening mammogram for malignant neoplasm of breast: Secondary | ICD-10-CM

## 2023-08-15 ENCOUNTER — Ambulatory Visit
Admission: RE | Admit: 2023-08-15 | Discharge: 2023-08-15 | Disposition: A | Discharge status: Home / Self Care | Payer: BC Managed Care – PPO | Source: Ambulatory Visit | Attending: Hematology and Oncology

## 2023-08-15 DIAGNOSIS — Z1231 Encounter for screening mammogram for malignant neoplasm of breast: Secondary | ICD-10-CM

## 2024-01-06 ENCOUNTER — Telehealth: Payer: Self-pay | Admitting: Hematology and Oncology

## 2024-01-06 NOTE — Telephone Encounter (Signed)
 left vm for pt to call back and reschedule her appt that she request to be canceled.

## 2024-01-09 ENCOUNTER — Inpatient Hospital Stay: Payer: BC Managed Care – PPO | Admitting: Hematology and Oncology

## 2024-01-25 ENCOUNTER — Other Ambulatory Visit: Payer: Self-pay | Admitting: Hematology and Oncology

## 2024-01-26 ENCOUNTER — Encounter: Payer: Self-pay | Admitting: Hematology and Oncology

## 2024-02-21 NOTE — Assessment & Plan Note (Signed)
 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  0/1 lymph node negative, additional surgery 09/11/2019.  The margins Oncotype DX score 16 (risk of recurrence at 9 years: 15%) Adjuvant radiation 10/17/2019 11/13/2019   Current treatment: Exemestane  (10/12/2019: Goserelin: Discontinued, anastrozole  started 11/27/2019 discontinued 11/14/2020, BSO: 03/20/2020, exemestane  started July 2022)   Exemestane  toxicities: Trigger finger Slight tingling   Breast cancer surveillance: 1.  Breast exam 02/21/2024: Benign 2. mammogram 08/18/2023: Benign breast density category B 3.  CT CAP 06/07/2022: No findings of metastatic disease.  5 x 3 mm groundglass density right upper lobe nonspecific benign, fatty liver 4.  Brain MRI 06/07/2022: No metastatic disease   Severe Anxiety: going through a divorce. Lung nodule: Follows with pulmonary CT chest 09/18/2022: Persistent 5 mm right upper lobe nodule CT chest 04/07/2023: Stable 5 mm right upper lobe nodule   Return to clinic in 1 year for follow-up

## 2024-02-22 ENCOUNTER — Inpatient Hospital Stay: Attending: Hematology and Oncology | Admitting: Hematology and Oncology

## 2024-02-22 VITALS — BP 124/78 | HR 91 | Temp 98.7°F | Resp 18 | Wt 132.7 lb

## 2024-02-22 DIAGNOSIS — Z79811 Long term (current) use of aromatase inhibitors: Secondary | ICD-10-CM | POA: Insufficient documentation

## 2024-02-22 DIAGNOSIS — M2550 Pain in unspecified joint: Secondary | ICD-10-CM | POA: Diagnosis not present

## 2024-02-22 DIAGNOSIS — Z79899 Other long term (current) drug therapy: Secondary | ICD-10-CM | POA: Diagnosis not present

## 2024-02-22 DIAGNOSIS — R911 Solitary pulmonary nodule: Secondary | ICD-10-CM | POA: Insufficient documentation

## 2024-02-22 DIAGNOSIS — C50412 Malignant neoplasm of upper-outer quadrant of left female breast: Secondary | ICD-10-CM | POA: Diagnosis not present

## 2024-02-22 DIAGNOSIS — Z170 Estrogen receptor positive status [ER+]: Secondary | ICD-10-CM | POA: Insufficient documentation

## 2024-02-22 DIAGNOSIS — F419 Anxiety disorder, unspecified: Secondary | ICD-10-CM | POA: Insufficient documentation

## 2024-02-22 DIAGNOSIS — I469 Cardiac arrest, cause unspecified: Secondary | ICD-10-CM | POA: Insufficient documentation

## 2024-02-22 DIAGNOSIS — I10 Essential (primary) hypertension: Secondary | ICD-10-CM | POA: Diagnosis not present

## 2024-02-22 DIAGNOSIS — Z17411 Hormone receptor positive with human epidermal growth factor receptor 2 negative status: Secondary | ICD-10-CM | POA: Diagnosis not present

## 2024-02-22 NOTE — Progress Notes (Signed)
 Patient Care Team: Valma Carwin, MD as PCP - General (Internal Medicine) Dewey Rush, MD as Consulting Physician (Radiation Oncology) Ebbie Cough, MD as Consulting Physician (General Surgery) Claudene Victory ORN, MD (Inactive) as Consulting Physician (Cardiology) Sissy Cough, MD as Consulting Physician (Orthopedic Surgery)  DIAGNOSIS:  Encounter Diagnosis  Name Primary?   Malignant neoplasm of upper-outer quadrant of left breast in female, estrogen receptor positive (HCC) Yes    SUMMARY OF ONCOLOGIC HISTORY: Oncology History  Malignant neoplasm of upper-outer quadrant of left breast in female, estrogen receptor positive (HCC)  08/09/2019 Genetic Testing   Negative genetic testing:  No pathogenic variants detected on the [facility] Breast Cancer STAT or Common Hereditary Cancers panel. A variant of uncertain significance was detected in the MSH3 gene called c.803G>A (p.Arg268Gln). The report date is 08/09/2019.  The STAT Breast cancer panel offered by [facility] includes sequencing and rearrangement analysis for the following 9 genes:  ATM, BRCA1, BRCA2, CDH1, CHEK2, PALB2, PTEN, STK11 and TP53.  The Common Hereditary Cancers Panel offered by [facility] includes sequencing and/or deletion duplication testing of the following 48 genes: APC, ATM, AXIN2, BARD1, BMPR1A, BRCA1, BRCA2, BRIP1, CDH1, CDK4, CDKN2A (p14ARF), CDKN2A (p16INK4a), CHEK2, CTNNA1, DICER1, EPCAM (Deletion/duplication testing only), GREM1 (promoter region deletion/duplication testing only), KIT, MEN1, MLH1, MSH2, MSH3, MSH6, MUTYH, NBN, NF1, NHTL1, PALB2, PDGFRA, PMS2, POLD1, POLE, PTEN, RAD50, RAD51C, RAD51D, RNF43, SDHB, SDHC, SDHD, SMAD4, SMARCA4. STK11, TP53, TSC1, TSC2, and VHL.  The following genes were evaluated for sequence changes only: SDHA and HOXB13 c.251G>A variant only.    08/21/2019 Initial Diagnosis   left lumpectomy and sentinel lymph node sampling 08/21/2019 for a pT1c pN1(mic), stage IA invasive ductal  carcinoma grade 2, with a positive anterior/inferior margin. (a) a single sentinel lymph node was removed (b) additional surgery 09/11/2019 cleared the margins   08/21/2019 Cancer Staging   Staging form: Breast, AJCC 8th Edition - Pathologic stage from 08/21/2019: Stage IA (pT1c, pN68mi, cM0, G2, ER+, PR+, HER2-) - Signed by Crawford Morna Pickle, NP on 09/05/2019   07/2019 Oncotype testing   Oncotype score of 16 predicts a risk of recurrence outside the breast in the next 9 years of 15% if the patient's only systemic therapy is tamoxifen for 5 years   10/12/2019 -  Anti-estrogen oral therapy   (a) goserelin/Zoladex  started 10/12/2019, discontinued after 02/29/2020 dose (b) anastrozole  started 11/27/2019, discontinued May 2022 with multiple side effects (c) bilateral salpingo-oophorectomy 03/20/2020 (d) bone density 05/19/2020 shows a T score of -0.1 (normal). (e) exemestane  started July 2022   10/17/2019 - 11/13/2019 Radiation Therapy   adjuvant radiation      CHIEF COMPLIANT: Follow-up to discuss her treatment plan for breast cancer  HISTORY OF PRESENT ILLNESS:   History of Present Illness Robin Castillo is a 53 year old female with breast cancer who presents for follow-up regarding her cancer treatment and management.  She has been on aromatase inhibitors for three to four years, experiencing debilitating joint pain, particularly in her hands. An extensive panel in April showed a vitamin D  level of 99, B12 over 2000, and a white blood cell count of 2.9.  She is exploring alternative treatments, including low-dose naltrexone, iodine  with castor oil on her breast, and supplements such as PEA, DIM, cortisol management supplements, and magnesium glycinate. Her diet consists of fruits and vegetables, avoiding meat and fish, and she engages in regular physical activity, including walking and sauna use.  A lung nodule has been monitored with CT scans, and she expresses  concern about  radiation exposure. She has a history of lymph node removal with a 'trace' finding that concerned her, although her surgeon was not worried. She is considering a blood test to check for cancer recurrence but is apprehensive about the potential emotional stress of a false positive result.     ALLERGIES:  has no known allergies.  MEDICATIONS:  Current Outpatient Medications  Medication Sig Dispense Refill   Ascorbic Acid (VITAMIN C ) 1000 MG tablet Take 2 tablets (2,000 mg total) by mouth daily. (Patient taking differently: Take 2,000 mg by mouth daily. Taking 3 tablets)     exemestane  (AROMASIN ) 25 MG tablet TAKE 1 TABLET DAILY AFTER  BREAKFAST 90 tablet 0   fluocinolone (VANOS) 0.01 % cream Apply topically as needed.     gabapentin  (NEURONTIN ) 300 MG capsule TAKE 1 CAPSULE AT BEDTIME  AS NEEDED 90 capsule 3   lisinopril-hydrochlorothiazide (ZESTORETIC) 10-12.5 MG tablet Take 1 tablet by mouth.     liver oil-zinc oxide (DESITIN) 40 % ointment Apply 1 Application topically as needed for irritation.     Omega-3 Fatty Acids (FISH OIL PO) Take by mouth daily.     Turmeric 500 MG TABS PATIENT TAKES IN DIET     vitamin B-12 (CYANOCOBALAMIN) 100 MCG tablet Take 1 tablet (100 mcg total) by mouth daily.     No current facility-administered medications for this visit.    PHYSICAL EXAMINATION: ECOG PERFORMANCE STATUS: 1 - Symptomatic but completely ambulatory  Vitals:   02/22/24 0911  BP: 124/78  Pulse: 91  Resp: 18  Temp: 98.7 F (37.1 C)  SpO2: 100%   Filed Weights   02/22/24 0911  Weight: 132 lb 11.2 oz (60.2 kg)      LABORATORY DATA:  I have reviewed the data as listed    Latest Ref Rng & Units 07/09/2021    9:45 AM 03/24/2021    8:45 AM 11/18/2020    8:38 AM  CMP  Glucose 70 - 99 mg/dL 80  87  91   BUN 6 - 20 mg/dL 6  10  10    Creatinine 0.44 - 1.00 mg/dL 9.26  9.16  9.18   Sodium 135 - 145 mmol/L 139  140  141   Potassium 3.5 - 5.1 mmol/L 3.8  3.8  3.5   Chloride 98 - 111  mmol/L 102  105  103   CO2 22 - 32 mmol/L 31  25  28    Calcium 8.9 - 10.3 mg/dL 89.9  9.8  89.9   Total Protein 6.5 - 8.1 g/dL 7.3  7.2  7.6   Total Bilirubin 0.3 - 1.2 mg/dL 0.6  0.7  0.7   Alkaline Phos 38 - 126 U/L 77  78  85   AST 15 - 41 U/L 16  16  19    ALT 0 - 44 U/L 14  13  17      Lab Results  Component Value Date   WBC 3.3 (L) 07/09/2021   HGB 12.6 07/09/2021   HCT 38.6 07/09/2021   MCV 89.4 07/09/2021   PLT 230 07/09/2021   NEUTROABS 1.7 07/09/2021    ASSESSMENT & PLAN:  Malignant neoplasm of upper-outer quadrant of left breast in female, estrogen receptor positive (HCC) Left lumpectomy and sentinel lymph node sampling 08/21/2019 for a pT1c pN1(mic), 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  0/1 lymph node negative, additional surgery 09/11/2019.  The margins Oncotype DX score 16 (risk of recurrence at 9  years: 15%) Adjuvant radiation 10/17/2019 11/13/2019   Current treatment: Exemestane  (10/12/2019: Goserelin: Discontinued, anastrozole  started 11/27/2019 discontinued 11/14/2020, BSO: 03/20/2020, exemestane  started July 2022) discontinued June 2025  Treatment plan: Patient is seeing a holistic provider and takes a lots of vitamins and supplements and has cleansed her food intake as well as staying active and exercising.  She does not wish to take any further antiestrogen treatments. She also does not want to do another mammogram because previously it had missed her breast cancer diagnosis.  I urged her to reconsider that.   Breast cancer surveillance: 1.  Breast exam 02/21/2024: Benign 2. mammogram 08/18/2023: Benign breast density category B 3.  CT CAP 06/07/2022: No findings of metastatic disease.  5 x 3 mm groundglass density right upper lobe nonspecific benign, fatty liver 4.  Brain MRI 06/07/2022: No metastatic disease   Severe Anxiety: Much improved since divorce has been finalized and her ex-husband has left the home. Lung  nodule: Follows with pulmonary CT chest 09/18/2022: Persistent 5 mm right upper lobe nodule.  She does not want to do another CT scan yet because she is concerned about radiation exposure.  CT chest 04/07/2023: Stable 5 mm right upper lobe nodule    Return to clinic in 1 year for follow-up   Assessment & Plan Estrogen receptor positive breast cancer, status post treatment She discontinued Exemestane  due to significant side effects and her preference for alternative medicine. Supported her decision and advised on the importance of annual mammograms over ultrasounds. Discussed a blood test for cancer recurrence, noting a 10% false positive rate. - Discontinue Exemestane . - Advise annual mammograms. - Provide brochure on blood test for cancer recurrence.  Stable solitary pulmonary nodule The lung nodule remains stable on CT scans. Explained MRI is unsuitable for lung imaging and reassured her of the nodule's likely benign nature. Suggested extending CT scan interval to two years if preferred. - Consider extending CT scan interval to two years.  Anxiety disorder Her anxiety is exacerbated by personal circumstances. She manages it with lifestyle changes, including diet and exercise.  Essential hypertension She is on Lisinopril and uses natural remedies, leading to occasionally very low blood pressure.      No orders of the defined types were placed in this encounter.  The patient has a good understanding of the overall plan. she agrees with it. she will call with any problems that may develop before the next visit here. Total time spent: 45 mins including face to face time and time spent for planning, charting and co-ordination of care   Naomi MARLA Chad, MD 02/22/24

## 2024-03-24 ENCOUNTER — Other Ambulatory Visit: Payer: Self-pay | Admitting: Hematology and Oncology

## 2025-02-21 ENCOUNTER — Inpatient Hospital Stay: Admitting: Hematology and Oncology
